# Patient Record
Sex: Female | Born: 1935 | Race: White | Hispanic: No | Smoking: Never smoker
Health system: Southern US, Community
[De-identification: ages and names within clinical notes are randomized; demographics above are authoritative.]

## PROBLEM LIST (undated history)

## (undated) DIAGNOSIS — I5181 Takotsubo syndrome: Secondary | ICD-10-CM

## (undated) DIAGNOSIS — M1990 Unspecified osteoarthritis, unspecified site: Secondary | ICD-10-CM

## (undated) DIAGNOSIS — K219 Gastro-esophageal reflux disease without esophagitis: Secondary | ICD-10-CM

## (undated) DIAGNOSIS — I219 Acute myocardial infarction, unspecified: Secondary | ICD-10-CM

## (undated) DIAGNOSIS — I10 Essential (primary) hypertension: Secondary | ICD-10-CM

## (undated) DIAGNOSIS — I495 Sick sinus syndrome: Secondary | ICD-10-CM

## (undated) DIAGNOSIS — I2510 Atherosclerotic heart disease of native coronary artery without angina pectoris: Secondary | ICD-10-CM

## (undated) DIAGNOSIS — Z950 Presence of cardiac pacemaker: Secondary | ICD-10-CM

## (undated) DIAGNOSIS — F32A Depression, unspecified: Secondary | ICD-10-CM

## (undated) DIAGNOSIS — F411 Generalized anxiety disorder: Secondary | ICD-10-CM

## (undated) DIAGNOSIS — I509 Heart failure, unspecified: Secondary | ICD-10-CM

## (undated) DIAGNOSIS — F329 Major depressive disorder, single episode, unspecified: Secondary | ICD-10-CM

## (undated) DIAGNOSIS — I428 Other cardiomyopathies: Secondary | ICD-10-CM

## (undated) DIAGNOSIS — I4891 Unspecified atrial fibrillation: Secondary | ICD-10-CM

## (undated) DIAGNOSIS — E7800 Pure hypercholesterolemia, unspecified: Secondary | ICD-10-CM

## (undated) DIAGNOSIS — R0602 Shortness of breath: Secondary | ICD-10-CM

## (undated) HISTORY — DX: Sick sinus syndrome: I49.5

## (undated) HISTORY — DX: Heart failure, unspecified: I50.9

## (undated) HISTORY — DX: Unspecified atrial fibrillation: I48.91

## (undated) HISTORY — DX: Gastro-esophageal reflux disease without esophagitis: K21.9

## (undated) HISTORY — PX: CERVICAL SPINE SURGERY: SHX589

## (undated) HISTORY — PX: CORONARY ANGIOPLASTY: SHX604

## (undated) HISTORY — PX: RADICAL HYSTERECTOMY: SHX2283

## (undated) HISTORY — DX: Shortness of breath: R06.02

## (undated) HISTORY — DX: Pure hypercholesterolemia, unspecified: E78.00

## (undated) HISTORY — PX: BACK SURGERY: SHX140

## (undated) HISTORY — DX: Generalized anxiety disorder: F41.1

## (undated) HISTORY — DX: Other cardiomyopathies: I42.8

## (undated) HISTORY — PX: PACEMAKER INSERTION: SHX728

---

## 1998-05-11 ENCOUNTER — Other Ambulatory Visit: Admission: RE | Admit: 1998-05-11 | Discharge: 1998-05-11 | Payer: Self-pay | Admitting: Internal Medicine

## 1998-05-29 ENCOUNTER — Inpatient Hospital Stay (HOSPITAL_COMMUNITY): Admission: EM | Admit: 1998-05-29 | Discharge: 1998-05-31 | Payer: Self-pay | Admitting: Emergency Medicine

## 1998-06-05 ENCOUNTER — Encounter: Admission: RE | Admit: 1998-06-05 | Discharge: 1998-09-03 | Payer: Self-pay | Admitting: Endocrinology

## 1998-06-06 ENCOUNTER — Inpatient Hospital Stay (HOSPITAL_COMMUNITY): Admission: EM | Admit: 1998-06-06 | Discharge: 1998-06-09 | Payer: Self-pay | Admitting: Emergency Medicine

## 1998-06-06 ENCOUNTER — Encounter: Payer: Self-pay | Admitting: Emergency Medicine

## 1999-05-17 ENCOUNTER — Other Ambulatory Visit: Admission: RE | Admit: 1999-05-17 | Discharge: 1999-05-17 | Payer: Self-pay | Admitting: Internal Medicine

## 2000-02-09 ENCOUNTER — Encounter: Payer: Self-pay | Admitting: Neurological Surgery

## 2000-02-10 ENCOUNTER — Inpatient Hospital Stay (HOSPITAL_COMMUNITY): Admission: RE | Admit: 2000-02-10 | Discharge: 2000-02-12 | Payer: Self-pay | Admitting: Neurological Surgery

## 2000-02-10 ENCOUNTER — Encounter: Payer: Self-pay | Admitting: Neurological Surgery

## 2000-04-07 ENCOUNTER — Encounter: Admission: RE | Admit: 2000-04-07 | Discharge: 2000-04-07 | Payer: Self-pay | Admitting: Neurological Surgery

## 2000-04-07 ENCOUNTER — Encounter: Payer: Self-pay | Admitting: Neurological Surgery

## 2000-05-19 ENCOUNTER — Other Ambulatory Visit: Admission: RE | Admit: 2000-05-19 | Discharge: 2000-05-19 | Payer: Self-pay | Admitting: Internal Medicine

## 2002-11-22 ENCOUNTER — Ambulatory Visit (HOSPITAL_COMMUNITY): Admission: RE | Admit: 2002-11-22 | Discharge: 2002-11-23 | Payer: Self-pay | Admitting: Internal Medicine

## 2002-11-22 ENCOUNTER — Encounter: Payer: Self-pay | Admitting: Internal Medicine

## 2002-11-23 ENCOUNTER — Encounter: Payer: Self-pay | Admitting: Internal Medicine

## 2003-11-02 ENCOUNTER — Emergency Department (HOSPITAL_COMMUNITY): Admission: EM | Admit: 2003-11-02 | Discharge: 2003-11-02 | Payer: Self-pay | Admitting: *Deleted

## 2004-08-12 ENCOUNTER — Ambulatory Visit: Payer: Self-pay | Admitting: Internal Medicine

## 2004-09-09 ENCOUNTER — Ambulatory Visit: Payer: Self-pay | Admitting: Internal Medicine

## 2004-09-30 ENCOUNTER — Ambulatory Visit: Payer: Self-pay | Admitting: Cardiology

## 2004-10-28 ENCOUNTER — Ambulatory Visit: Payer: Self-pay | Admitting: Cardiology

## 2004-11-05 ENCOUNTER — Encounter: Payer: Self-pay | Admitting: Cardiology

## 2004-11-05 ENCOUNTER — Inpatient Hospital Stay (HOSPITAL_COMMUNITY): Admission: EM | Admit: 2004-11-05 | Discharge: 2004-11-12 | Payer: Self-pay | Admitting: *Deleted

## 2004-11-05 ENCOUNTER — Ambulatory Visit: Payer: Self-pay | Admitting: Cardiology

## 2004-11-15 ENCOUNTER — Ambulatory Visit: Payer: Self-pay | Admitting: Cardiology

## 2004-11-15 ENCOUNTER — Ambulatory Visit: Payer: Self-pay | Admitting: *Deleted

## 2004-11-16 ENCOUNTER — Ambulatory Visit: Payer: Self-pay | Admitting: Cardiology

## 2004-11-25 ENCOUNTER — Ambulatory Visit: Payer: Self-pay | Admitting: Internal Medicine

## 2004-11-25 ENCOUNTER — Ambulatory Visit: Payer: Self-pay | Admitting: *Deleted

## 2004-12-14 ENCOUNTER — Ambulatory Visit: Payer: Self-pay | Admitting: Cardiovascular Disease

## 2004-12-14 ENCOUNTER — Ambulatory Visit: Payer: Self-pay

## 2005-01-10 ENCOUNTER — Ambulatory Visit: Payer: Self-pay | Admitting: Cardiology

## 2005-01-11 ENCOUNTER — Ambulatory Visit: Payer: Self-pay | Admitting: Cardiology

## 2005-01-17 ENCOUNTER — Ambulatory Visit: Payer: Self-pay

## 2005-01-24 ENCOUNTER — Ambulatory Visit: Payer: Self-pay | Admitting: *Deleted

## 2005-02-07 ENCOUNTER — Ambulatory Visit: Payer: Self-pay | Admitting: Cardiovascular Disease

## 2005-02-22 ENCOUNTER — Ambulatory Visit: Payer: Self-pay | Admitting: Cardiology

## 2005-03-22 ENCOUNTER — Ambulatory Visit: Payer: Self-pay | Admitting: Internal Medicine

## 2005-04-19 ENCOUNTER — Ambulatory Visit: Payer: Self-pay | Admitting: *Deleted

## 2005-04-25 ENCOUNTER — Ambulatory Visit: Payer: Self-pay | Admitting: Internal Medicine

## 2005-05-17 ENCOUNTER — Ambulatory Visit: Payer: Self-pay | Admitting: Cardiology

## 2005-06-07 ENCOUNTER — Ambulatory Visit: Payer: Self-pay | Admitting: Cardiovascular Disease

## 2005-06-20 ENCOUNTER — Ambulatory Visit: Payer: Self-pay

## 2005-07-04 ENCOUNTER — Ambulatory Visit: Payer: Self-pay | Admitting: Internal Medicine

## 2005-08-01 ENCOUNTER — Ambulatory Visit: Payer: Self-pay | Admitting: Cardiology

## 2005-08-15 ENCOUNTER — Ambulatory Visit: Payer: Self-pay | Admitting: Internal Medicine

## 2005-08-26 ENCOUNTER — Ambulatory Visit: Payer: Self-pay | Admitting: Cardiovascular Disease

## 2005-08-26 ENCOUNTER — Ambulatory Visit: Payer: Self-pay | Admitting: Cardiology

## 2005-08-29 ENCOUNTER — Ambulatory Visit: Payer: Self-pay

## 2005-09-09 ENCOUNTER — Ambulatory Visit: Payer: Self-pay | Admitting: Cardiovascular Disease

## 2005-09-30 ENCOUNTER — Ambulatory Visit: Payer: Self-pay | Admitting: Internal Medicine

## 2005-10-05 ENCOUNTER — Observation Stay (HOSPITAL_COMMUNITY): Admission: EM | Admit: 2005-10-05 | Discharge: 2005-10-06 | Payer: Self-pay | Admitting: Emergency Medicine

## 2005-10-06 ENCOUNTER — Ambulatory Visit: Payer: Self-pay | Admitting: *Deleted

## 2005-10-14 ENCOUNTER — Ambulatory Visit: Payer: Self-pay

## 2005-10-28 ENCOUNTER — Ambulatory Visit: Payer: Self-pay | Admitting: Cardiology

## 2005-11-07 ENCOUNTER — Ambulatory Visit: Payer: Self-pay | Admitting: Internal Medicine

## 2005-11-07 ENCOUNTER — Ambulatory Visit: Payer: Self-pay | Admitting: Cardiovascular Disease

## 2005-11-15 ENCOUNTER — Ambulatory Visit: Payer: Self-pay | Admitting: Cardiology

## 2005-12-05 ENCOUNTER — Ambulatory Visit: Payer: Self-pay | Admitting: Internal Medicine

## 2005-12-26 ENCOUNTER — Ambulatory Visit: Payer: Self-pay | Admitting: Internal Medicine

## 2006-01-11 ENCOUNTER — Ambulatory Visit: Payer: Self-pay | Admitting: Cardiology

## 2006-01-23 ENCOUNTER — Ambulatory Visit: Payer: Self-pay | Admitting: Internal Medicine

## 2006-01-24 ENCOUNTER — Ambulatory Visit: Payer: Self-pay

## 2006-01-31 ENCOUNTER — Ambulatory Visit: Payer: Self-pay | Admitting: Cardiovascular Disease

## 2006-02-06 ENCOUNTER — Ambulatory Visit: Payer: Self-pay | Admitting: Cardiology

## 2006-02-08 ENCOUNTER — Encounter: Payer: Self-pay | Admitting: Endocrinology

## 2006-02-20 ENCOUNTER — Ambulatory Visit: Payer: Self-pay | Admitting: Cardiology

## 2006-03-13 ENCOUNTER — Ambulatory Visit: Payer: Self-pay | Admitting: Cardiology

## 2006-04-10 ENCOUNTER — Ambulatory Visit: Payer: Self-pay | Admitting: Cardiovascular Disease

## 2006-04-24 ENCOUNTER — Ambulatory Visit: Payer: Self-pay | Admitting: Cardiology

## 2006-05-10 ENCOUNTER — Ambulatory Visit: Payer: Self-pay | Admitting: *Deleted

## 2006-05-31 ENCOUNTER — Ambulatory Visit: Payer: Self-pay | Admitting: Cardiology

## 2006-06-15 ENCOUNTER — Ambulatory Visit: Payer: Self-pay | Admitting: Cardiovascular Disease

## 2006-06-21 ENCOUNTER — Ambulatory Visit: Payer: Self-pay | Admitting: Cardiovascular Disease

## 2006-07-05 ENCOUNTER — Ambulatory Visit: Payer: Self-pay | Admitting: Cardiology

## 2006-07-26 ENCOUNTER — Ambulatory Visit: Payer: Self-pay | Admitting: *Deleted

## 2006-08-23 ENCOUNTER — Ambulatory Visit: Payer: Self-pay | Admitting: Cardiology

## 2006-09-20 ENCOUNTER — Ambulatory Visit: Payer: Self-pay | Admitting: *Deleted

## 2006-10-13 ENCOUNTER — Ambulatory Visit: Payer: Self-pay | Admitting: Cardiology

## 2006-11-13 ENCOUNTER — Ambulatory Visit: Payer: Self-pay | Admitting: Cardiology

## 2006-12-11 ENCOUNTER — Ambulatory Visit: Payer: Self-pay | Admitting: Cardiology

## 2006-12-27 ENCOUNTER — Ambulatory Visit: Payer: Self-pay | Admitting: Cardiovascular Disease

## 2007-01-09 ENCOUNTER — Ambulatory Visit: Payer: Self-pay | Admitting: Internal Medicine

## 2007-02-07 ENCOUNTER — Ambulatory Visit: Payer: Self-pay | Admitting: Internal Medicine

## 2007-02-21 ENCOUNTER — Ambulatory Visit: Payer: Self-pay | Admitting: *Deleted

## 2007-03-07 ENCOUNTER — Ambulatory Visit: Payer: Self-pay | Admitting: Cardiology

## 2007-04-04 ENCOUNTER — Ambulatory Visit: Payer: Self-pay | Admitting: Internal Medicine

## 2007-04-25 ENCOUNTER — Ambulatory Visit: Payer: Self-pay | Admitting: Internal Medicine

## 2007-05-30 ENCOUNTER — Ambulatory Visit: Payer: Self-pay | Admitting: Cardiovascular Disease

## 2007-06-14 ENCOUNTER — Ambulatory Visit: Payer: Self-pay | Admitting: Cardiology

## 2007-06-28 ENCOUNTER — Ambulatory Visit: Payer: Self-pay | Admitting: Cardiology

## 2007-06-28 ENCOUNTER — Ambulatory Visit: Payer: Self-pay | Admitting: Cardiovascular Disease

## 2007-07-02 ENCOUNTER — Emergency Department (HOSPITAL_COMMUNITY): Admission: EM | Admit: 2007-07-02 | Discharge: 2007-07-03 | Payer: Self-pay | Admitting: Emergency Medicine

## 2007-07-11 ENCOUNTER — Ambulatory Visit: Payer: Self-pay | Admitting: Internal Medicine

## 2007-07-19 ENCOUNTER — Ambulatory Visit: Payer: Self-pay | Admitting: Cardiology

## 2007-08-17 ENCOUNTER — Ambulatory Visit: Payer: Self-pay | Admitting: Cardiology

## 2007-08-30 ENCOUNTER — Ambulatory Visit: Payer: Self-pay | Admitting: Cardiology

## 2007-09-23 ENCOUNTER — Emergency Department (HOSPITAL_COMMUNITY): Admission: EM | Admit: 2007-09-23 | Discharge: 2007-09-23 | Payer: Self-pay | Admitting: Emergency Medicine

## 2007-10-24 ENCOUNTER — Ambulatory Visit: Payer: Self-pay | Admitting: Cardiovascular Disease

## 2007-11-22 ENCOUNTER — Ambulatory Visit: Payer: Self-pay | Admitting: Cardiology

## 2007-11-28 ENCOUNTER — Ambulatory Visit: Payer: Self-pay | Admitting: Internal Medicine

## 2007-12-21 ENCOUNTER — Ambulatory Visit: Payer: Self-pay | Admitting: Cardiovascular Disease

## 2007-12-21 ENCOUNTER — Ambulatory Visit: Payer: Self-pay | Admitting: Internal Medicine

## 2008-01-10 ENCOUNTER — Ambulatory Visit: Payer: Self-pay

## 2008-01-10 ENCOUNTER — Ambulatory Visit: Payer: Self-pay | Admitting: Internal Medicine

## 2008-01-18 ENCOUNTER — Encounter: Admission: RE | Admit: 2008-01-18 | Discharge: 2008-01-18 | Payer: Self-pay | Admitting: Internal Medicine

## 2008-02-07 ENCOUNTER — Ambulatory Visit: Payer: Self-pay | Admitting: Cardiology

## 2008-03-06 ENCOUNTER — Ambulatory Visit: Payer: Self-pay | Admitting: Cardiology

## 2008-03-27 ENCOUNTER — Ambulatory Visit: Payer: Self-pay | Admitting: Cardiology

## 2008-04-24 ENCOUNTER — Ambulatory Visit: Payer: Self-pay | Admitting: Cardiovascular Disease

## 2008-04-28 ENCOUNTER — Ambulatory Visit: Payer: Self-pay

## 2008-05-08 ENCOUNTER — Ambulatory Visit: Payer: Self-pay | Admitting: Cardiology

## 2008-05-29 ENCOUNTER — Ambulatory Visit: Payer: Self-pay | Admitting: Internal Medicine

## 2008-06-12 ENCOUNTER — Ambulatory Visit: Payer: Self-pay | Admitting: Internal Medicine

## 2008-06-18 ENCOUNTER — Ambulatory Visit: Payer: Self-pay | Admitting: Cardiovascular Disease

## 2008-06-18 ENCOUNTER — Ambulatory Visit: Payer: Self-pay | Admitting: Internal Medicine

## 2008-06-18 LAB — CONVERTED CEMR LAB
BUN: 17 mg/dL (ref 6–23)
CO2: 27 meq/L (ref 19–32)
Calcium: 9 mg/dL (ref 8.4–10.5)
Glucose, Bld: 142 mg/dL — ABNORMAL HIGH (ref 70–99)
Sodium: 141 meq/L (ref 135–145)

## 2008-07-02 ENCOUNTER — Ambulatory Visit: Payer: Self-pay | Admitting: Cardiology

## 2008-07-16 ENCOUNTER — Ambulatory Visit: Payer: Self-pay | Admitting: Internal Medicine

## 2008-07-23 ENCOUNTER — Ambulatory Visit: Payer: Self-pay | Admitting: Internal Medicine

## 2008-08-21 ENCOUNTER — Ambulatory Visit: Payer: Self-pay | Admitting: Cardiology

## 2008-08-28 ENCOUNTER — Ambulatory Visit: Payer: Self-pay | Admitting: Cardiology

## 2008-09-11 ENCOUNTER — Ambulatory Visit: Payer: Self-pay | Admitting: Internal Medicine

## 2008-10-09 ENCOUNTER — Ambulatory Visit: Payer: Self-pay | Admitting: Cardiology

## 2008-10-15 ENCOUNTER — Ambulatory Visit: Payer: Self-pay | Admitting: Internal Medicine

## 2008-11-06 ENCOUNTER — Ambulatory Visit: Payer: Self-pay | Admitting: Cardiology

## 2008-12-04 ENCOUNTER — Ambulatory Visit: Payer: Self-pay | Admitting: Cardiology

## 2009-01-01 ENCOUNTER — Ambulatory Visit: Payer: Self-pay | Admitting: Internal Medicine

## 2009-01-28 DIAGNOSIS — I2510 Atherosclerotic heart disease of native coronary artery without angina pectoris: Secondary | ICD-10-CM | POA: Insufficient documentation

## 2009-01-28 DIAGNOSIS — I428 Other cardiomyopathies: Secondary | ICD-10-CM

## 2009-01-28 DIAGNOSIS — E7800 Pure hypercholesterolemia, unspecified: Secondary | ICD-10-CM

## 2009-01-28 DIAGNOSIS — R609 Edema, unspecified: Secondary | ICD-10-CM | POA: Insufficient documentation

## 2009-01-28 DIAGNOSIS — I509 Heart failure, unspecified: Secondary | ICD-10-CM | POA: Insufficient documentation

## 2009-01-28 DIAGNOSIS — F411 Generalized anxiety disorder: Secondary | ICD-10-CM | POA: Insufficient documentation

## 2009-01-28 DIAGNOSIS — I4891 Unspecified atrial fibrillation: Secondary | ICD-10-CM | POA: Insufficient documentation

## 2009-01-28 DIAGNOSIS — I498 Other specified cardiac arrhythmias: Secondary | ICD-10-CM | POA: Insufficient documentation

## 2009-01-28 DIAGNOSIS — I495 Sick sinus syndrome: Secondary | ICD-10-CM | POA: Insufficient documentation

## 2009-01-28 DIAGNOSIS — I219 Acute myocardial infarction, unspecified: Secondary | ICD-10-CM | POA: Insufficient documentation

## 2009-01-28 DIAGNOSIS — M1990 Unspecified osteoarthritis, unspecified site: Secondary | ICD-10-CM | POA: Insufficient documentation

## 2009-01-28 DIAGNOSIS — R0602 Shortness of breath: Secondary | ICD-10-CM | POA: Insufficient documentation

## 2009-01-28 DIAGNOSIS — K219 Gastro-esophageal reflux disease without esophagitis: Secondary | ICD-10-CM

## 2009-01-29 ENCOUNTER — Encounter: Payer: Self-pay | Admitting: Cardiovascular Disease

## 2009-01-29 ENCOUNTER — Ambulatory Visit: Payer: Self-pay | Admitting: Cardiovascular Disease

## 2009-02-02 ENCOUNTER — Encounter: Payer: Self-pay | Admitting: Internal Medicine

## 2009-02-02 ENCOUNTER — Ambulatory Visit: Payer: Self-pay | Admitting: Internal Medicine

## 2009-02-02 ENCOUNTER — Ambulatory Visit: Payer: Self-pay

## 2009-02-11 ENCOUNTER — Ambulatory Visit: Payer: Self-pay | Admitting: Internal Medicine

## 2009-02-18 ENCOUNTER — Ambulatory Visit: Payer: Self-pay | Admitting: Internal Medicine

## 2009-02-25 ENCOUNTER — Ambulatory Visit: Payer: Self-pay | Admitting: Internal Medicine

## 2009-03-04 ENCOUNTER — Ambulatory Visit: Payer: Self-pay | Admitting: Internal Medicine

## 2009-03-10 ENCOUNTER — Encounter: Payer: Self-pay | Admitting: *Deleted

## 2009-03-12 ENCOUNTER — Ambulatory Visit: Payer: Self-pay | Admitting: Internal Medicine

## 2009-03-26 ENCOUNTER — Ambulatory Visit: Payer: Self-pay | Admitting: Internal Medicine

## 2009-04-06 ENCOUNTER — Ambulatory Visit: Payer: Self-pay | Admitting: Internal Medicine

## 2009-04-07 ENCOUNTER — Encounter: Payer: Self-pay | Admitting: Cardiovascular Disease

## 2009-04-15 ENCOUNTER — Encounter: Payer: Self-pay | Admitting: *Deleted

## 2009-04-23 ENCOUNTER — Ambulatory Visit: Payer: Self-pay | Admitting: Internal Medicine

## 2009-04-29 ENCOUNTER — Encounter: Payer: Self-pay | Admitting: Internal Medicine

## 2009-05-11 ENCOUNTER — Ambulatory Visit: Payer: Self-pay | Admitting: Internal Medicine

## 2009-05-14 ENCOUNTER — Ambulatory Visit: Payer: Self-pay | Admitting: Cardiology

## 2009-05-14 LAB — CONVERTED CEMR LAB: POC INR: 0.9

## 2009-05-20 ENCOUNTER — Ambulatory Visit: Payer: Self-pay | Admitting: Cardiology

## 2009-05-20 LAB — CONVERTED CEMR LAB: POC INR: 2.3

## 2009-05-29 ENCOUNTER — Ambulatory Visit: Payer: Self-pay | Admitting: Cardiology

## 2009-05-29 LAB — CONVERTED CEMR LAB: POC INR: 2.7

## 2009-06-01 ENCOUNTER — Ambulatory Visit: Payer: Self-pay | Admitting: Cardiovascular Disease

## 2009-06-01 LAB — CONVERTED CEMR LAB: POC INR: 2.8

## 2009-06-19 ENCOUNTER — Telehealth: Payer: Self-pay | Admitting: Cardiovascular Disease

## 2009-06-22 ENCOUNTER — Ambulatory Visit: Payer: Self-pay | Admitting: Cardiovascular Disease

## 2009-07-20 ENCOUNTER — Ambulatory Visit: Payer: Self-pay | Admitting: Internal Medicine

## 2009-07-20 LAB — CONVERTED CEMR LAB: POC INR: 2.7

## 2009-07-22 ENCOUNTER — Ambulatory Visit: Payer: Self-pay | Admitting: Internal Medicine

## 2009-07-29 ENCOUNTER — Encounter: Payer: Self-pay | Admitting: Internal Medicine

## 2009-08-02 ENCOUNTER — Emergency Department (HOSPITAL_COMMUNITY): Admission: EM | Admit: 2009-08-02 | Discharge: 2009-08-02 | Payer: Self-pay | Admitting: Emergency Medicine

## 2009-08-02 ENCOUNTER — Encounter: Payer: Self-pay | Admitting: Cardiovascular Disease

## 2009-08-17 ENCOUNTER — Ambulatory Visit: Payer: Self-pay | Admitting: Internal Medicine

## 2009-08-31 ENCOUNTER — Ambulatory Visit: Payer: Self-pay | Admitting: Internal Medicine

## 2009-09-01 ENCOUNTER — Encounter (INDEPENDENT_AMBULATORY_CARE_PROVIDER_SITE_OTHER): Payer: Self-pay | Admitting: *Deleted

## 2009-09-21 ENCOUNTER — Ambulatory Visit: Payer: Self-pay | Admitting: Cardiovascular Disease

## 2009-10-15 ENCOUNTER — Encounter (INDEPENDENT_AMBULATORY_CARE_PROVIDER_SITE_OTHER): Payer: Self-pay | Admitting: *Deleted

## 2009-10-23 ENCOUNTER — Encounter: Payer: Self-pay | Admitting: Internal Medicine

## 2009-10-26 ENCOUNTER — Ambulatory Visit: Payer: Self-pay | Admitting: Cardiology

## 2009-10-26 LAB — CONVERTED CEMR LAB: POC INR: 2.6

## 2009-11-03 ENCOUNTER — Ambulatory Visit: Payer: Self-pay | Admitting: Internal Medicine

## 2009-11-11 ENCOUNTER — Encounter: Payer: Self-pay | Admitting: Internal Medicine

## 2009-11-23 ENCOUNTER — Encounter (INDEPENDENT_AMBULATORY_CARE_PROVIDER_SITE_OTHER): Payer: Self-pay | Admitting: Cardiology

## 2009-11-23 ENCOUNTER — Ambulatory Visit: Payer: Self-pay | Admitting: Cardiology

## 2009-11-23 LAB — CONVERTED CEMR LAB: POC INR: 2.2

## 2009-12-29 ENCOUNTER — Ambulatory Visit: Payer: Self-pay | Admitting: Cardiovascular Disease

## 2010-01-19 ENCOUNTER — Ambulatory Visit: Payer: Self-pay | Admitting: Internal Medicine

## 2010-01-26 ENCOUNTER — Ambulatory Visit: Payer: Self-pay | Admitting: Cardiovascular Disease

## 2010-01-26 LAB — CONVERTED CEMR LAB: POC INR: 3.1

## 2010-02-15 ENCOUNTER — Emergency Department (HOSPITAL_COMMUNITY): Admission: EM | Admit: 2010-02-15 | Discharge: 2010-02-15 | Payer: Self-pay | Admitting: Emergency Medicine

## 2010-03-02 ENCOUNTER — Ambulatory Visit: Payer: Self-pay | Admitting: Cardiology

## 2010-03-02 LAB — CONVERTED CEMR LAB: POC INR: 5.2

## 2010-03-09 ENCOUNTER — Ambulatory Visit: Payer: Self-pay | Admitting: Internal Medicine

## 2010-03-19 ENCOUNTER — Ambulatory Visit: Payer: Self-pay | Admitting: Cardiovascular Disease

## 2010-04-05 ENCOUNTER — Ambulatory Visit: Payer: Self-pay | Admitting: Cardiology

## 2010-04-20 ENCOUNTER — Ambulatory Visit: Payer: Self-pay | Admitting: Cardiovascular Disease

## 2010-05-07 ENCOUNTER — Encounter (INDEPENDENT_AMBULATORY_CARE_PROVIDER_SITE_OTHER): Payer: Self-pay | Admitting: *Deleted

## 2010-05-10 ENCOUNTER — Ambulatory Visit: Payer: Self-pay | Admitting: Cardiovascular Disease

## 2010-05-27 ENCOUNTER — Ambulatory Visit: Payer: Self-pay

## 2010-05-27 ENCOUNTER — Encounter: Payer: Self-pay | Admitting: Internal Medicine

## 2010-05-27 ENCOUNTER — Ambulatory Visit: Payer: Self-pay | Admitting: Cardiovascular Disease

## 2010-06-17 ENCOUNTER — Ambulatory Visit: Payer: Self-pay | Admitting: Internal Medicine

## 2010-07-06 ENCOUNTER — Ambulatory Visit: Payer: Self-pay | Admitting: Cardiovascular Disease

## 2010-07-09 ENCOUNTER — Inpatient Hospital Stay (HOSPITAL_COMMUNITY): Admission: EM | Admit: 2010-07-09 | Discharge: 2010-07-15 | Payer: Self-pay | Admitting: Emergency Medicine

## 2010-07-09 ENCOUNTER — Ambulatory Visit: Payer: Self-pay | Admitting: Internal Medicine

## 2010-07-10 ENCOUNTER — Encounter: Payer: Self-pay | Admitting: Internal Medicine

## 2010-07-27 ENCOUNTER — Ambulatory Visit: Payer: Self-pay | Admitting: Cardiology

## 2010-07-27 LAB — CONVERTED CEMR LAB: POC INR: 2.9

## 2010-08-06 ENCOUNTER — Ambulatory Visit: Payer: Self-pay | Admitting: Cardiovascular Disease

## 2010-08-09 ENCOUNTER — Telehealth: Payer: Self-pay | Admitting: Cardiovascular Disease

## 2010-08-10 LAB — CONVERTED CEMR LAB
CO2: 28 meq/L (ref 19–32)
Calcium: 9.3 mg/dL (ref 8.4–10.5)
Chloride: 106 meq/L (ref 96–112)
Glucose, Bld: 94 mg/dL (ref 70–99)
Potassium: 4.2 meq/L (ref 3.5–5.1)
Sed Rate: 67 mm/hr — ABNORMAL HIGH (ref 0–22)
Sodium: 142 meq/L (ref 135–145)

## 2010-08-23 ENCOUNTER — Telehealth: Payer: Self-pay | Admitting: Cardiovascular Disease

## 2010-08-24 ENCOUNTER — Ambulatory Visit: Payer: Self-pay | Admitting: Cardiovascular Disease

## 2010-08-24 LAB — CONVERTED CEMR LAB: POC INR: 2.3

## 2010-08-31 ENCOUNTER — Encounter: Payer: Self-pay | Admitting: Internal Medicine

## 2010-09-21 ENCOUNTER — Ambulatory Visit: Payer: Self-pay | Admitting: Internal Medicine

## 2010-09-21 LAB — CONVERTED CEMR LAB: POC INR: 3

## 2010-10-08 ENCOUNTER — Encounter (INDEPENDENT_AMBULATORY_CARE_PROVIDER_SITE_OTHER): Payer: Self-pay | Admitting: *Deleted

## 2010-10-13 ENCOUNTER — Encounter: Payer: Self-pay | Admitting: Internal Medicine

## 2010-10-14 ENCOUNTER — Encounter: Payer: Self-pay | Admitting: Internal Medicine

## 2010-10-14 ENCOUNTER — Ambulatory Visit
Admission: RE | Admit: 2010-10-14 | Discharge: 2010-10-14 | Payer: Self-pay | Source: Home / Self Care | Attending: Internal Medicine | Admitting: Internal Medicine

## 2010-10-19 ENCOUNTER — Ambulatory Visit: Admission: RE | Admit: 2010-10-19 | Discharge: 2010-10-19 | Payer: Self-pay | Source: Home / Self Care

## 2010-10-20 ENCOUNTER — Encounter (INDEPENDENT_AMBULATORY_CARE_PROVIDER_SITE_OTHER): Payer: Self-pay | Admitting: *Deleted

## 2010-10-29 ENCOUNTER — Ambulatory Visit: Admit: 2010-10-29 | Payer: Self-pay | Admitting: Cardiovascular Disease

## 2010-11-09 NOTE — Letter (Signed)
Summary: Device-Delinquent Check  [facility], Main Office  [address]   [place], [place] [address]   Phone: [phone]  Fax: [phone]     May 07, 2010 MRN: [id number]   [name] [address] [place], [place]  [address]   Dear [name],  According to our records, you have not had your implanted device checked in the recommended period of time.  We are unable to determine appropriate device function without checking your device on a regular basis.  Please call our office to schedule an appointment with Dr. Ladona Ridgel, as soon as possible.  If you are having your device checked by another physician, please call us so that we may update our records.  Thank you,  Altha Harm, LPN  May 07, 2010 2:21 PM  [facility]

## 2010-11-11 NOTE — Assessment & Plan Note (Signed)
Summary: eph/heart failure/mt   Primary Provider:  Jacky Kindle, MD  CC:  follow up from hospital.  History of Present Illness: [name] is seen today post hospitalizatoin.  She has a history of MI with T[place].  Cath this hospitalization showed no CAD wit EF 25% and mildly increased filling pressures.  Meds adjusted and quick improvement.  ESR 70 being w/u by Dr Evlyn Kanner.  Still with some PND and orthopnea but tough to tell due to her anxiety and insommnia.  No SSCP, no edema, weight stable.  Pain in legs.  Statin stopped by primary 2 weeks ago.  Ok since she has no vascular disease  Current Problems (verified): 1)  Osteoarthritis  (ICD-715.90) 2)  Gerd  (ICD-530.81) 3)  Bradycardia  (ICD-427.89) 4)  Myocardial Infarction  (ICD-410.90) 5)  CHF  (ICD-428.0) 6)  Hypercholesterolemia  (ICD-272.0) 7)  Shortness of Breath  (ICD-786.05) 8)  Sick Sinus Syndrome  (ICD-427.81) 9)  Paroxysmal Atrial Fibrillation  (ICD-427.31) 10)  Cad  (ICD-414.00) 11)  Cardiomyopathy  (ICD-425.4) 12)  Anxiety  (ICD-300.00) 13)  Edema  (ICD-782.3)  Current Medications (verified): 1)  Benazepril Hcl 20 Mg Tabs (Benazepril Hcl) .Marland Kitchen.. 1 Once Daily 2)  Warfarin Sodium 4 Mg Tabs (Warfarin Sodium) .... Use As Directed By Anticoagulation Clinic 3)  Pravachol 20 Mg Tabs (Pravastatin Sodium) .Marland Kitchen.. 1 Tab By Mouth Once Daily 4)  Glipizide-Metformin Hcl 5-500 Mg Tabs (Glipizide-Metformin Hcl) .... Two Times A Day 5)  Xanax 0.25 Mg Tabs (Alprazolam) .Marland Kitchen.. 1 Two Times A Day 6)  Metoprolol Succinate 50 Mg Xr24h-Tab (Metoprolol Succinate) .... Take One Tablet By Mouth Daily 7)  Furosemide 40 Mg Tabs (Furosemide) .... Take 1/2 Tablet Two Times A Day 8)  Namenda 10 Mg Tabs (Memantine Hcl) .Marland Kitchen.. 1 Tab By Mouth Two Times A Day 9)  Nitroglycerin 0.4 Mg Subl (Nitroglycerin) .... One Tablet Under Tongue Every 5 Minutes As Needed For Chest Pain---May Repeat Times Three 10)  Paxil 20 Mg Tabs (Paroxetine Hcl) .... Take 1 Tablet Once Daily 11)   Spironolactone 25 Mg Tabs (Spironolactone) .... Take 1/2 Tablet By Mouth Daily  Allergies (verified): No Known Drug Allergies  Past History:  Past Medical History: Last updated: 2009-02-04 Current Problems:  BRADYCARDIA (ICD-427.89) MYOCARDIAL INFARCTION (ICD-410.90) CHF (ICD-428.0) HYPERCHOLESTEROLEMIA (ICD-272.0) SHORTNESS OF BREATH (ICD-786.05) SICK SINUS SYNDROME (ICD-427.81) PAROXYSMAL ATRIAL FIBRILLATION (ICD-427.31) CAD (ICD-414.00) CARDIOMYOPATHY (ICD-425.4) ANXIETY (ICD-300.00) EDEMA (ICD-782.3)  Anticoagulation therapy with Coumadin.  Past Surgical History: Last updated: 2009-02-04 Status post cardiac catheterization in January of 2006 Status post Adenosine Myoview in April of 2006 Status post dobutamine and Cardiolite in December 2003 pacemaker Status post C-spine surgery in the past.  back surgery hysterectomy   Family History: Last updated: 02-04-2009 Her parents both died in their 59's and neither had heart  disease. Her brother is still alive and has a history of heart disease.  Social History: Last updated: 02/04/09 She lives in [place] alone and is a retired Producer, television/film/video for [employer].  Tobacco Use - No.  Alcohol Use - no  Review of Systems       Denies fever, malais, weight loss, blurry vision, decreased visual acuity, cough, sputum, hemoptysis, pleuritic pain, palpitaitons, heartburn, abdominal pain, melena, lower extremity edema, claudication, or rash.   Vital Signs:  Patient profile:   76 year old female Height:      66 inches Weight:      205 pounds BMI:     33.21 Pulse rate:   69 / minute  Resp:     14 per minute BP sitting:   133 / 76  (left arm)  Vitals Entered By: Kem Parkinson (August 06, 2010 10:41 AM)  Physical Exam  General:  Affect appropriate Healthy:  appears stated age HEENT: normal Neck supple with no adenopathy JVP normal no bruits no thyromegaly Lungs clear with no wheezing and good  diaphragmatic motion Heart:  S1/S2 no murmur,rub, gallop or click PMI normal Abdomen: benighn, BS positve, no tenderness, no AAA no bruit.  No HSM or HJR Distal pulses intact with no bruits No edema Neuro non-focal Skin warm and dry    PPM Specifications Following MD:  Lewayne Bunting, MD     Referring MD:  [facility] PPM Vendor:  Medtronic     PPM Model Number:  [id number]     PPM Serial Number:  [id number] PPM DOI:  11/22/2002     PPM Implanting MD:  Lewayne Bunting, MD  Lead 1    Location: RA     DOI: 11/22/2002     Model #: [id number]     Serial #: [id number]     Status: active Lead 2    Location: RV     DOI: 11/22/2002     Model #: [id number]     Serial #: [id number]     Status: active  Magnet Response Rate:  ERI 65  Indications:  BRADYCARDIA, AV BLOCK   PPM Follow Up Pacer Dependent:  Yes      Episodes Coumadin:  Yes  Parameters Mode:  DDDR     Lower Rate Limit:  60     Upper Rate Limit:  130 Paced AV Delay:  150     Sensed AV Delay:  120  Impression & Recommendations:  Problem # 1:  BRADYCARDIA (ICD-427.89) Pacer with no tachypalpitations.   Her updated medication list for this problem includes:    Benazepril Hcl 20 Mg Tabs (Benazepril hcl) .Marland Kitchen... 1 once daily    Warfarin Sodium 4 Mg Tabs (Warfarin sodium) ..... Use as directed by anticoagulation clinic    Metoprolol Succinate 50 Mg Xr24h-tab (Metoprolol succinate) .Marland Kitchen... Take one tablet by mouth daily    Nitroglycerin 0.4 Mg Subl (Nitroglycerin) ..... One tablet under tongue every 5 minutes as needed for chest pain---may repeat times three  Problem # 2:  MYOCARDIAL INFARCTION (ICD-410.90) Catachol related but recurrent decrease in EF.  Continue lasix bb and ace Her updated medication list for this problem includes:    Benazepril Hcl 20 Mg Tabs (Benazepril hcl) .Marland Kitchen... 1 once daily    Warfarin Sodium 4 Mg Tabs (Warfarin sodium) ..... Use as directed by anticoagulation clinic    Metoprolol Succinate 50 Mg Xr24h-tab (Metoprolol succinate)  .Marland Kitchen... Take one tablet by mouth daily    Nitroglycerin 0.4 Mg Subl (Nitroglycerin) ..... One tablet under tongue every 5 minutes as needed for chest pain---may repeat times three  Problem # 3:  CHF (ICD-428.0) Improved check labs today.  Low sodium diet Her updated medication list for this problem includes:    Benazepril Hcl 20 Mg Tabs (Benazepril hcl) .Marland Kitchen... 1 once daily    Warfarin Sodium 4 Mg Tabs (Warfarin sodium) ..... Use as directed by anticoagulation clinic    Metoprolol Succinate 50 Mg Xr24h-tab (Metoprolol succinate) .Marland Kitchen... Take one tablet by mouth daily    Furosemide 40 Mg Tabs (Furosemide) .Marland Kitchen... Take 1/2 tablet two times a day    Nitroglycerin 0.4 Mg Subl (Nitroglycerin) ..... One tablet under tongue every 5 minutes as needed for  chest pain---may repeat times three    Spironolactone 25 Mg Tabs (Spironolactone) .Marland Kitchen... Take 1/2 tablet by mouth daily  Problem # 4:  HYPERCHOLESTEROLEMIA (ICD-272.0) Resume statin if leg pain not improved.  Pulses are good with no evidence of vascular disease Her updated medication list for this problem includes:    Pravachol 20 Mg Tabs (Pravastatin sodium) .Marland Kitchen... 1 tab by mouth once daily  Other Orders: TLB-BMP (Basic Metabolic Panel-BMET) (80048-METABOL) TLB-BNP (B-Natriuretic Peptide) (83880-BNPR) TLB-Sedimentation Rate (ESR) (85652-ESR)  Patient Instructions: 1)  Your physician recommends that you schedule a follow-up appointment in: 3 MONTHS

## 2010-11-11 NOTE — Medication Information (Signed)
Summary: rov.mp  Anticoagulant Therapy  Managed by: Bethena Midget, RN, BSN Referring MD: Charlton Haws MD Supervising MD: [name] MD, Theron Arista Indication 1: Atrial Fibrillation (ICD-427.31) Indication 2: Congestive Heart Failure (ICD-428.0) Lab Used: [facility] Site: [facility] INR POC 2.3 INR RANGE 2.0-3.0  Dietary changes: no    Health status changes: no    Bleeding/hemorrhagic complications: no    Recent/future hospitalizations: no    Any changes in medication regimen? no    Recent/future dental: no  Any missed doses?: no       Is patient compliant with meds? yes      Comments: Saw Dr. [name] today.   Allergies: No Known Drug Allergies  Anticoagulation Management History:      The patient is taking warfarin and comes in today for a routine follow up visit.  Positive risk factors for bleeding include an age of 74 years or older.  The bleeding index is 'intermediate risk'.  Positive CHADS2 values include History of CHF.  Negative CHADS2 values include Age > 57 years old.  The start date was 11/08/2004.  Anticoagulation responsible provider: [name] MD, Theron Arista.  INR POC: 2.3.  Cuvette Lot#: [id number].  Exp: 02/2011.    Anticoagulation Management Assessment/Plan:      The patient's current anticoagulation dose is Warfarin sodium 4 mg tabs: Use as directed by Anticoagulation Clinic.  The target INR is 2.0-3.0.  The next INR is due 01/26/2010.  Anticoagulation instructions were given to patient/daughter.  Results were reviewed/authorized by Bethena Midget, RN, BSN.  She was notified by Bethena Midget, RN, BSN.         Prior Anticoagulation Instructions: INR 2.2  Continue 0.5 tab daily except 1 tab each Monday, Wednesday, Friday.  Recheck in 4 weeks.   Current Anticoagulation Instructions: INR 2.3 Continue 2mg s daily except 4mg s on Mondays, Wednesdays, and Fridays. Recheck in 4 weeks.

## 2010-11-11 NOTE — Medication Information (Signed)
Summary: rov/tm  Anticoagulant Therapy  Managed by: Weston Brass, PharmD Referring MD: Charlton Haws MD PCP: Jacky Kindle, MD Supervising MD: Gala Romney MD, Reuel Boom Indication 1: Atrial Fibrillation (ICD-427.31) Indication 2: Congestive Heart Failure (ICD-428.0) Lab Used: [facility] Site: [facility] INR POC 3.3 INR RANGE 2.0-3.0  Dietary changes: yes       Details: Patient has increased amount of greens in diet.  Health status changes: no    Bleeding/hemorrhagic complications: no    Recent/future hospitalizations: no    Any changes in medication regimen? no    Recent/future dental: no   Is patient compliant with meds? yes       Allergies: No Known Drug Allergies  Anticoagulation Management History:      The patient is taking warfarin and comes in today for a routine follow up visit.  Positive risk factors for bleeding include an age of 74 years or older.  The bleeding index is 'intermediate risk'.  Positive CHADS2 values include History of CHF.  Negative CHADS2 values include Age > 45 years old.  The start date was 11/08/2004.  Anticoagulation responsible provider: Bensimhon MD, Reuel Boom.  INR POC: 3.3.  Cuvette Lot#: [id number].  Exp: 05/2011.    Anticoagulation Management Assessment/Plan:      The patient's current anticoagulation dose is Warfarin sodium 4 mg tabs: Use as directed by Anticoagulation Clinic.  The target INR is 2.0-3.0.  The next INR is due 03/19/2010.  Anticoagulation instructions were given to patient/daughter.  Results were reviewed/authorized by Weston Brass, PharmD.  She was notified by [facility].         Prior Anticoagulation Instructions: INR  5.2 Skip today and Wednesdays dose then resume 1/2 tab daily except 1 tab on MWF. Recheck in one week.  Current Anticoagulation Instructions: INR-3.3 Take half a tablet today and resume normal dosing schedule. Take  1 tablet on Monday and Wednesday and Fridayand take half a tablet on all other days.Continue with  the greens in diet. Return in  10 days.

## 2010-11-11 NOTE — Assessment & Plan Note (Signed)
Summary: PC2/DM   Visit Type:  Follow-up Primary Provider:  Jacky Kindle, MD   History of Present Illness: Ms. [name] returns today for a followup. She is a pleasant, 76 year old woman with a history of symptomatic bradycardia, atypical chest pain who was status post pacemaker insertion. She also has longstanding hypertension. She did not have much in the way of complaints today except for intermittant dyspnea. In the past, she has had atypical chest pain but this is not present at the moment. She has had no syncope, she has rare palpitations.   Current Medications (verified): 1)  Benazepril Hcl 20 Mg Tabs (Benazepril Hcl) .Marland Kitchen.. 1 Once Daily 2)  Warfarin Sodium 4 Mg Tabs (Warfarin Sodium) .... Use As Directed By Anticoagulation Clinic 3)  Pravachol 20 Mg Tabs (Pravastatin Sodium) .Marland Kitchen.. 1 Tab By Mouth Once Daily 4)  Glipizide-Metformin Hcl 5-500 Mg Tabs (Glipizide-Metformin Hcl) .... Two Times A Day 5)  Xanax 0.25 Mg Tabs (Alprazolam) .Marland Kitchen.. 1 Two Times A Day 6)  Metoprolol Succinate 50 Mg Xr24h-Tab (Metoprolol Succinate) .... Take One Tablet By Mouth Daily 7)  Furosemide 40 Mg Tabs (Furosemide) .... Take 1 1/2 Once Daily 8)  Klor-Con M20 20 Meq Cr-Tabs (Potassium Chloride Crys Cr) .Marland Kitchen.. 1 Once Daily 9)  Namenda 10 Mg Tabs (Memantine Hcl) .Marland Kitchen.. 1 Once Daily 10)  Nitroglycerin 0.4 Mg Subl (Nitroglycerin) .... One Tablet Under Tongue Every 5 Minutes As Needed For Chest Pain---May Repeat Times Three 11)  Paxil 20 Mg Tabs (Paroxetine Hcl) .... Take 1 Tablet Once Daily  Allergies (verified): No Known Drug Allergies  Past History:  Past Medical History: Last updated: 01/28/2009 Current Problems:  BRADYCARDIA (ICD-427.89) MYOCARDIAL INFARCTION (ICD-410.90) CHF (ICD-428.0) HYPERCHOLESTEROLEMIA (ICD-272.0) SHORTNESS OF BREATH (ICD-786.05) SICK SINUS SYNDROME (ICD-427.81) PAROXYSMAL ATRIAL FIBRILLATION (ICD-427.31) CAD (ICD-414.00) CARDIOMYOPATHY (ICD-425.4) ANXIETY (ICD-300.00) EDEMA  (ICD-782.3)  Anticoagulation therapy with Coumadin.  Past Surgical History: Last updated: 01/28/2009 Status post cardiac catheterization in January of 2006 Status post Adenosine Myoview in April of 2006 Status post dobutamine and Cardiolite in December 2003 pacemaker Status post C-spine surgery in the past.  back surgery hysterectomy   Review of Systems  The patient denies chest pain, syncope, and peripheral edema.    Vital Signs:  Patient profile:   76 year old female Height:      66 inches Weight:      209 pounds BMI:     33.86 Pulse rate:   75 / minute BP sitting:   128 / 60  (left arm)  Vitals Entered By: Laurance Flatten CMA (January 19, 2010 2:05 PM)  Physical Exam  General:  Affect appropriate Healthy:  appears stated age HEENT: normal Neck supple with no adenopathy JVP normal no bruits no thyromegaly Lungs clear with no wheezing  Heart:  S1/S2 no murmur,rub, gallop or click PMI normal Abdomen: benighn, BS positve, no tenderness, no AAA no bruit.  No HSM or HJR Distal pulses intact with no bruits No edema Neuro non-focal Skin warm and dry    PPM Specifications Following MD:  Lewayne Bunting, MD     Referring MD:  [facility] PPM Vendor:  [employer]tr[employer]     PPM Model Number:  [id number]     PPM Serial Number:  [id number] PPM DOI:  11/22/2002     PPM Implanting MD:  Lewayne Bunting, MD  Lead 1    Location: RA     DOI: 11/22/2002     Model #: [id number]     Serial #: [id number]     Status:  active Lead 2    Location: RV     DOI: 11/22/2002     Model #: [id number]     Serial #: [id number]     Status: active  Magnet Response Rate:  ERI 65  Indications:  BRADYCARDIA, AV BLOCK   PPM Follow Up Remote Check?  No Battery Voltage:  2.72 V     Battery Est. Longevity:  23 months     Pacer Dependent:  Yes       PPM Device Measurements Atrium  Amplitude: 2.8 mV, Impedance: 415 ohms, Threshold: 1.0 V at 0.4 msec Right Ventricle  Impedance: 857 ohms, Threshold: 0.5 V at 0.4 msec  Episodes MS  Episodes:  550     Percent Mode Switch:  8.0%     Coumadin:  Yes Atrial Pacing:  56.4%     Ventricular Pacing:  100%  Parameters Mode:  DDDR     Lower Rate Limit:  60     Upper Rate Limit:  130 Paced AV Delay:  150     Sensed AV Delay:  120 Next Cardiology Appt Due:  07/10/2010 Tech Comments:  No parameter changes.  Device function normal.  ROV 6 months clinic.  Checked by [employer]. Altha Harm, LPN  January 19, 2010 2:16 PM  MD Comments:  agree with above.  Impression & Recommendations:  Problem # 1:  PAROXYSMAL ATRIAL FIBRILLATION (ICD-427.31)  She appears to be maintaining NSR.  Continue current meds. Her updated medication list for this problem includes:    Warfarin Sodium 4 Mg Tabs (Warfarin sodium) ..... Use as directed by anticoagulation clinic    Metoprolol Succinate 50 Mg Xr24h-tab (Metoprolol succinate) .Marland Kitchen... Take one tablet by mouth daily  Her updated medication list for this problem includes:    Warfarin Sodium 4 Mg Tabs (Warfarin sodium) ..... Use as directed by anticoagulation clinic    Metoprolol Succinate 50 Mg Xr24h-tab (Metoprolol succinate) .Marland Kitchen... Take one tablet by mouth daily  Problem # 2:  SICK SINUS SYNDROME (ICD-427.81) Her PPM is working normally.  She now has underlying CHB.  She will followup up for PPM check in several months. Her updated medication list for this problem includes:    Benazepril Hcl 20 Mg Tabs (Benazepril hcl) .Marland Kitchen... 1 once daily    Warfarin Sodium 4 Mg Tabs (Warfarin sodium) ..... Use as directed by anticoagulation clinic    Metoprolol Succinate 50 Mg Xr24h-tab (Metoprolol succinate) .Marland Kitchen... Take one tablet by mouth daily    Nitroglycerin 0.4 Mg Subl (Nitroglycerin) ..... One tablet under tongue every 5 minutes as needed for chest pain---may repeat times three  Patient Instructions: 1)  Your physician recommends that you schedule a follow-up appointment in: 6 months in device clinic and 12 months with Dr Ladona Ridgel

## 2010-11-11 NOTE — Medication Information (Signed)
Summary: rov/sp  Anticoagulant Therapy  Managed by: Cloyde Reams, RN, BSN Referring MD: Charlton Haws MD PCP: Jacky Kindle, MD Supervising MD: [name] MD, Theron Arista Indication 1: Atrial Fibrillation (ICD-427.31) Indication 2: Congestive Heart Failure (ICD-428.0) Lab Used: [facility] Site: [facility] INR POC 1.8 INR RANGE 2.0-3.0  Dietary changes: no    Health status changes: no    Bleeding/hemorrhagic complications: no    Recent/future hospitalizations: no    Any changes in medication regimen? no    Recent/future dental: no  Any missed doses?: no       Is patient compliant with meds? yes       Allergies: No Known Drug Allergies  Anticoagulation Management History:      The patient is taking warfarin and comes in today for a routine follow up visit.  Positive risk factors for bleeding include an age of 67 years or older.  The bleeding index is 'intermediate risk'.  Positive CHADS2 values include History of CHF.  Negative CHADS2 values include Age > 2 years old.  The start date was 11/08/2004.  Anticoagulation responsible provider: [name] MD, Theron Arista.  INR POC: 1.8.  Cuvette Lot#: [id number].  Exp: 07/2011.    Anticoagulation Management Assessment/Plan:      The patient's current anticoagulation dose is Warfarin sodium 4 mg tabs: Use as directed by Anticoagulation Clinic.  The target INR is 2.0-3.0.  The next INR is due 05/31/2010.  Anticoagulation instructions were given to patient/daughter.  Results were reviewed/authorized by Cloyde Reams, RN, BSN.  She was notified by Cloyde Reams RN.         Prior Anticoagulation Instructions: INR 1.8  Increase dose to 1/2 tablet every day except 1 tablet on Monday, Wednesday and Friday.   Current Anticoagulation Instructions: INR 1.8  Take 1.5 tablets todays, then resume same dosage 1/2 tablet daily except 1 tablet on Mondays, Wednesdays, and Fridays.  Recheck in 3 weeks.

## 2010-11-11 NOTE — Letter (Signed)
Summary: Appointment - Reminder 2  [facility], Main Office  [address]   [place], [place] [address]   Phone: [phone]  Fax: [phone]     October 15, 2009 MRN: [id number]   [name] [address] [place], [place]  [address]   Dear Ms. Marga Hoots,  Our records indicate that it is time to schedule a follow-up appointment with Dr. [name]. It is very important that we reach you to schedule this appointment. We look forward to participating in your health care needs. Please contact us at the number listed above at your earliest convenience to schedule your appointment.  If you are unable to make an appointment at this time, give [place] a call so we can update our records.  Sincerely,   Migdalia Dk [facility] Scheduling Team

## 2010-11-11 NOTE — Medication Information (Signed)
Summary: rov/sp  Anticoagulant Therapy  Managed by: Weston Brass, PharmD Referring MD: Charlton Haws MD PCP: Jacky Kindle, MD Supervising MD: Jens Som MD, Arlys John Indication 1: Atrial Fibrillation (ICD-427.31) Indication 2: Congestive Heart Failure (ICD-428.0) Lab Used: [facility] Site: [facility] INR POC 1.7 INR RANGE 2.0-3.0  Dietary changes: yes       Details: increased vitamin k   Health status changes: no    Bleeding/hemorrhagic complications: no    Recent/future hospitalizations: no    Any changes in medication regimen? no    Recent/future dental: no  Any missed doses?: no       Is patient compliant with meds? yes       Allergies: No Known Drug Allergies  Anticoagulation Management History:      The patient is taking warfarin and comes in today for a routine follow up visit.  Positive risk factors for bleeding include an age of 74 years or older.  The bleeding index is 'intermediate risk'.  Positive CHADS2 values include History of CHF.  Negative CHADS2 values include Age > 50 years old.  The start date was 11/08/2004.  Anticoagulation responsible provider: Jens Som MD, Arlys John.  INR POC: 1.7.  Exp: 05/2011.    Anticoagulation Management Assessment/Plan:      The patient's current anticoagulation dose is Warfarin sodium 4 mg tabs: Use as directed by Anticoagulation Clinic.  The target INR is 2.0-3.0.  The next INR is due 04/20/2010.  Anticoagulation instructions were given to patient/daughter.  Results were reviewed/authorized by Weston Brass, PharmD.  She was notified by Weston Brass PharmD.         Prior Anticoagulation Instructions: INR 3.1  Take 1/2 tablet today then decrease dose to 1/2 tablet every day except 1 tablet on Monday and Friday.    Current Anticoagulation Instructions: INR 1.7  Take 1 1/2 tablets today then resume same dose of 1/2 tablet every day except 1 tablet on Monday and Friday.

## 2010-11-11 NOTE — Procedures (Signed)
Summary: pacer check   Allergies (verified): No Known Drug Allergies  PPM Specifications Following [name]:  Kayla Bunting, [name]     Referring [name]:  [facility] PPM Vendor:  [employer]     PPM Model Number:  [id number]     PPM Serial Number:  [id number] PPM DOI:  11/22/2002     PPM Implanting [name]:  Kayla Bunting, [name]  Lead 1    Location: RA     DOI: 11/22/2002     Model #: [id number]     Serial #: [id number]     Status: active Lead 2    Location: RV     DOI: 11/22/2002     Model #: [id number]     Serial #: [id number]     Status: active  Magnet Response Rate:  ERI 65  Indications:  BRADYCARDIA, AV BLOCK   PPM Follow Up Remote Check?  No Battery Voltage:  2.70 V     Battery Est. Longevity:  17 months     Pacer Dependent:  Yes       PPM Device Measurements Atrium  Amplitude: 2.8 mV, Impedance: 401 ohms,  Right Ventricle  Impedance: 814 ohms, Threshold: 0.5 V at 0.4 msec  Episodes MS Episodes:  896     Percent Mode Switch:  11.3%     Coumadin:  Yes Ventricular High Rate:  0     Atrial Pacing:  60.2%     Ventricular Pacing:  99.9%  Parameters Mode:  DDDR     Lower Rate Limit:  60     Upper Rate Limit:  130 Paced AV Delay:  150     Sensed AV Delay:  120 Next Remote Date:  08/26/2010     Next Cardiology Appt Due:  01/09/2011 Tech Comments:  No parameter changes.  Device function normal.  A-fib/flutter-dependent, + coumdin.  Carelink transmissions every 3 months. ROV 4/12 with Dr. Ladona Ridgel. Altha Harm, LPN  May 27, 2010 10:21 AM

## 2010-11-11 NOTE — Cardiovascular Report (Signed)
Summary: Office Visit   Office Visit   Imported By: Roderic Ovens 01/22/2010 16:07:20  _____________________________________________________________________  External Attachment:    Type:   Image     Comment:   External Document

## 2010-11-11 NOTE — Cardiovascular Report (Signed)
Summary: Office Visit Remote   Office Visit Remote   Imported By: Roderic Ovens 10/29/2010 11:03:42  _____________________________________________________________________  External Attachment:    Type:   Image     Comment:   External Document

## 2010-11-11 NOTE — Medication Information (Signed)
Summary: rov/tm  Anticoagulant Therapy  Managed by: Louann Sjogren, PharmD Referring MD: Charlton Haws MD PCP: Jacky Kindle, MD Supervising MD: Casen Pryor MD,Ishaq Maffei Indication 1: Atrial Fibrillation (ICD-427.31) Indication 2: Congestive Heart Failure (ICD-428.0) Lab Used: [facility] Site: [facility] INR POC 2.0 INR RANGE 2.0-3.0  Dietary changes: no    Health status changes: no    Bleeding/hemorrhagic complications: no    Recent/future hospitalizations: no    Any changes in medication regimen? no    Recent/future dental: no  Any missed doses?: no       Is patient compliant with meds? yes       Allergies: No Known Drug Allergies  Anticoagulation Management History:      The patient is taking warfarin and comes in today for a routine follow up visit.  Positive risk factors for bleeding include an age of 74 years or older.  The bleeding index is 'intermediate risk'.  Positive CHADS2 values include History of CHF.  Negative CHADS2 values include Age > 70 years old.  The start date was 11/08/2004.  Today's INR is 2.0.  Anticoagulation responsible provider: Katesha Eichel MD,Javan Gonzaga.  INR POC: 2.0.  Cuvette Lot#: [id number].  Exp: 07/2011.    Anticoagulation Management Assessment/Plan:      The patient's current anticoagulation dose is Warfarin sodium 4 mg tabs: Use as directed by Anticoagulation Clinic.  The target INR is 2.0-3.0.  The next INR is due 11/16/2010.  Anticoagulation instructions were given to patient.  Results were reviewed/authorized by Louann Sjogren, PharmD.  She was notified by Louann Sjogren PharmD.         Prior Anticoagulation Instructions: INR 3.0 Continue 1/2 pill everyday except 1 pill on Mondays, Wednesdays and Fridays. Recheck in 4 weeks.   Current Anticoagulation Instructions: INR 2.0 (goal 2-3)  Continue taking 1 tablet on Mondays, Wednesdays, and Fridays and take 1/2 tablet all other days. Next appointment: Tuesday, Feb. 7th at 9:30AM.

## 2010-11-11 NOTE — Progress Notes (Signed)
Summary: c/o headache,   Phone Note Call from Patient Call back at Home Phone [phone]   Caller: Daughter- Financial controller Reason for Call: Talk to Nurse Complaint: Headache Summary of Call: c/o headache , now pt has a knot behind her ear.  more in her neck . pt has appt in am for coumadin.  Initial call taken by: Lorne Skeens,  August 23, 2010 12:56 PM  Follow-up for Phone Call        spoke with pt dtr, questions answered Deliah Goody, RN  August 23, 2010 1:15 PM

## 2010-11-11 NOTE — Cardiovascular Report (Signed)
Summary: Office Visit Remote   Office Visit Remote   Imported By: Roderic Ovens 11/16/2009 16:33:18  _____________________________________________________________________  External Attachment:    Type:   Image     Comment:   External Document

## 2010-11-11 NOTE — Medication Information (Signed)
Summary: rov/tm  Anticoagulant Therapy  Managed by: Weston Brass, PharmD Referring MD: Charlton Haws MD PCP: Jacky Kindle, MD Supervising MD: Excell Seltzer MD, Casimiro Needle Indication 1: Atrial Fibrillation (ICD-427.31) Indication 2: Congestive Heart Failure (ICD-428.0) Lab Used: [facility]  Site: [facility] INR POC 3.1 INR RANGE 2.0-3.0  Dietary changes: no    Health status changes: no    Bleeding/hemorrhagic complications: no    Recent/future hospitalizations: no    Any changes in medication regimen? no    Recent/future dental: no  Any missed doses?: no       Is patient compliant with meds? yes       Allergies: No Known Drug Allergies  Anticoagulation Management History:      The patient is taking warfarin and comes in today for a routine follow up visit.  Positive risk factors for bleeding include an age of 74 years or older.  The bleeding index is 'intermediate risk'.  Positive CHADS2 values include History of CHF.  Negative CHADS2 values include Age > 66 years old.  The start date was 11/08/2004.  Anticoagulation responsible provider: Excell Seltzer MD, Casimiro Needle.  INR POC: 3.1.  Cuvette Lot#: [id number].  Exp: 02/2011.    Anticoagulation Management Assessment/Plan:      The patient's current anticoagulation dose is Warfarin sodium 4 mg tabs: Use as directed by Anticoagulation Clinic.  The target INR is 2.0-3.0.  The next INR is due 02/23/2010.  Anticoagulation instructions were given to patient/daughter.  Results were reviewed/authorized by Weston Brass, PharmD.  She was notified by Weston Brass PharmD.         Prior Anticoagulation Instructions: INR 2.3 Continue 2mg s daily except 4mg s on Mondays, Wednesdays, and Fridays. Recheck in 4 weeks.   Current Anticoagulation Instructions: INR 3.1  Skip today's dose of Coumadin then resume same dose of 1/2 tablet every day except 1 tablet on Monday, Wednesday and Friday

## 2010-11-11 NOTE — Letter (Signed)
Summary: Device-Delinquent Phone Journalist, newspaper, Main Office  [address]   [place], [place] [address]   Phone: [phone]  Fax: [phone]     October 23, 2009 MRN: [id number]   [name] [address] [place], [place]  [address]   Dear [name],  According to our records, you were scheduled for a device phone transmission on  October 21, 2009.     We did not receive any results from this check.  If you transmitted on your scheduled day, please call us to help troubleshoot your system.  If you forgot to send your transmission, please send one upon receipt of this letter.  Thank you,   Architectural technologist [facility]

## 2010-11-11 NOTE — Medication Information (Signed)
Summary: rov/kb  Anticoagulant Therapy  Managed by: Weston Brass, PharmD Referring MD: Charlton Haws MD PCP: Jacky Kindle, MD Supervising MD: Clifton James MD, Cristal Deer Indication 1: Atrial Fibrillation (ICD-427.31) Indication 2: Congestive Heart Failure (ICD-428.0) Lab Used: [facility] Site: [facility] INR POC 3.1 INR RANGE 2.0-3.0  Dietary changes: yes       Details: Pt eating salads but does not like them and doesn't want to eat them as much  Health status changes: no    Bleeding/hemorrhagic complications: no    Recent/future hospitalizations: no    Any changes in medication regimen? no    Recent/future dental: no  Any missed doses?: yes     Details: Pt does not report missing any doses but daughter reports she often does not take medicines correctly   Is patient compliant with meds? yes       Allergies: No Known Drug Allergies  Anticoagulation Management History:      The patient is taking warfarin and comes in today for a routine follow up visit.  Positive risk factors for bleeding include an age of 16 years or older.  The bleeding index is 'intermediate risk'.  Positive CHADS2 values include History of CHF.  Negative CHADS2 values include Age > 51 years old.  The start date was 11/08/2004.  Anticoagulation responsible provider: Clifton James MD, Cristal Deer.  INR POC: 3.1.  Cuvette Lot#: [id number].  Exp: 05/2011.    Anticoagulation Management Assessment/Plan:      The patient's current anticoagulation dose is Warfarin sodium 4 mg tabs: Use as directed by Anticoagulation Clinic.  The target INR is 2.0-3.0.  The next INR is due 04/05/2010.  Anticoagulation instructions were given to patient/daughter.  Results were reviewed/authorized by Weston Brass, PharmD.  She was notified by Weston Brass PharmD.         Prior Anticoagulation Instructions: INR-3.3 Take half a tablet today and resume normal dosing schedule. Take  1 tablet on Monday and Wednesday and Fridayand take half a tablet on all  other days.Continue with the greens in diet. Return in  10 days.  Current Anticoagulation Instructions: INR 3.1  Take 1/2 tablet today then decrease dose to 1/2 tablet every day except 1 tablet on Monday and Friday.

## 2010-11-11 NOTE — Cardiovascular Report (Signed)
Summary: Office Visit   Office Visit   Imported By: Roderic Ovens 06/10/2010 15:48:12  _____________________________________________________________________  External Attachment:    Type:   Image     Comment:   External Document

## 2010-11-11 NOTE — Medication Information (Signed)
Summary: rov/sp  Anticoagulant Therapy  Managed by: Bethena Midget, RN, BSN Referring MD: Charlton Haws MD PCP: Jacky Kindle, MD Supervising MD: Gala Romney MD, Reuel Boom Indication 1: Atrial Fibrillation (ICD-427.31) Indication 2: Congestive Heart Failure (ICD-428.0) Lab Used: [facility] Site: [facility] INR POC 3.0 INR RANGE 2.0-3.0  Dietary changes: no    Health status changes: no    Bleeding/hemorrhagic complications: no    Recent/future hospitalizations: no    Any changes in medication regimen? no    Recent/future dental: no  Any missed doses?: no       Is patient compliant with meds? yes       Allergies: No Known Drug Allergies  Anticoagulation Management History:      The patient is taking warfarin and comes in today for a routine follow up visit.  Positive risk factors for bleeding include an age of 74 years or older.  The bleeding index is 'intermediate risk'.  Positive CHADS2 values include History of CHF.  Negative CHADS2 values include Age > 74 years old.  The start date was 11/08/2004.  Anticoagulation responsible provider: Bensimhon MD, Reuel Boom.  INR POC: 3.0.  Cuvette Lot#: [id number].  Exp: 07/2011.    Anticoagulation Management Assessment/Plan:      The patient's current anticoagulation dose is Warfarin sodium 4 mg tabs: Use as directed by Anticoagulation Clinic.  The target INR is 2.0-3.0.  The next INR is due 10/19/2010.  Anticoagulation instructions were given to patient.  Results were reviewed/authorized by Bethena Midget, RN, BSN.  She was notified by Bethena Midget, RN, BSN.         Prior Anticoagulation Instructions: INR 2.3  Continue same dose of 1/2 tablet every day except 1 tablet on Monday, Wednesday and Friday.  Recheck INR in 4 weeks.   Current Anticoagulation Instructions: INR 3.0 Continue 1/2 pill everyday except 1 pill on Mondays, Wednesdays and Fridays. Recheck in 4 weeks.

## 2010-11-11 NOTE — Letter (Signed)
Summary: Device-Delinquent Phone Journalist, newspaper, Main Office  [address]   [place], [place] [address]   Phone: [phone]  Fax: [phone]     August 31, 2010 MRN: [id number]   [name] [address] [place], [place]  [address]   Dear Ms. BOHANON,  According to our records, you were scheduled for a device phone transmission on 08-26-2010.     We did not receive any results from this check.  If you transmitted on your scheduled day, please call us to help troubleshoot your system.  If you forgot to send your transmission, please send one upon receipt of this letter.  Thank you,   Architectural technologist [facility]

## 2010-11-11 NOTE — Assessment & Plan Note (Signed)
Summary: f4m/dm   CC:  no complaints.  History of Present Illness: Collier is seen today in followup for atrial fibrillation anticoagulation and a history of Taka-Tsubo DCM.  unfortunately she continues to have severe anxiety disorder.  She startles easily and can have unusual episodes of shouting.Marland Kitchen  His affecting the quality of her life as she tends to stay home rather than having people see her like this. Marland Kitchen  Spent quite a lot of time talking with Tayelor because she trusts me. I know her daughter would greatly appreciate the help.  She is seeing a girl names Misty Stanley at [facility] and is doing some better on Paxil.   Her cardiac perspective she is not any significant chest pain.  Her heart function has recovered since her episode of chest pain.  She's not had any palpitations.  Because of her behavioral problems I have taken out of her anticoagulation research study and she is on Coumadin.  He has not had any bleeding problems.  She has a pacer implanted in 2004 for SSS and her transtelephonic checks are fine.    Current Problems (verified): 1)  Osteoarthritis  (ICD-715.90) 2)  Gerd  (ICD-530.81) 3)  Bradycardia  (ICD-427.89) 4)  Myocardial Infarction  (ICD-410.90) 5)  CHF  (ICD-428.0) 6)  Hypercholesterolemia  (ICD-272.0) 7)  Shortness of Breath  (ICD-786.05) 8)  Sick Sinus Syndrome  (ICD-427.81) 9)  Paroxysmal Atrial Fibrillation  (ICD-427.31) 10)  Cad  (ICD-414.00) 11)  Cardiomyopathy  (ICD-425.4) 12)  Anxiety  (ICD-300.00) 13)  Edema  (ICD-782.3)  Current Medications (verified): 1)  Benazepril Hcl 20 Mg Tabs (Benazepril Hcl) .Marland Kitchen.. 1 Once Daily 2)  Warfarin Sodium 4 Mg Tabs (Warfarin Sodium) .... Use As Directed By Anticoagulation Clinic 3)  Pravachol 20 Mg Tabs (Pravastatin Sodium) .Marland Kitchen.. 1 Tab By Mouth Once Daily 4)  Glipizide-Metformin Hcl 5-500 Mg Tabs (Glipizide-Metformin Hcl) .... Two Times A Day 5)  Xanax 0.25 Mg Tabs (Alprazolam) .Marland Kitchen.. 1 Two Times A Day 6)  Metoprolol Succinate  50 Mg Xr24h-Tab (Metoprolol Succinate) .... Take One Tablet By Mouth Daily 7)  Furosemide 40 Mg Tabs (Furosemide) .... Take 1 1/2 Once Daily 8)  Klor-Con M20 20 Meq Cr-Tabs (Potassium Chloride Crys Cr) .Marland Kitchen.. 1 Once Daily 9)  Namenda 10 Mg Tabs (Memantine Hcl) .Marland Kitchen.. 1 Once Daily 10)  Nitroglycerin 0.4 Mg Subl (Nitroglycerin) .... One Tablet Under Tongue Every 5 Minutes As Needed For Chest Pain---May Repeat Times Three 11)  Paxil 20 Mg Tabs (Paroxetine Hcl) .... Take 1 Tablet Once Daily  Allergies (verified): No Known Drug Allergies  Past History:  Past Medical History: Last updated: 2009/02/16 Current Problems:  BRADYCARDIA (ICD-427.89) MYOCARDIAL INFARCTION (ICD-410.90) CHF (ICD-428.0) HYPERCHOLESTEROLEMIA (ICD-272.0) SHORTNESS OF BREATH (ICD-786.05) SICK SINUS SYNDROME (ICD-427.81) PAROXYSMAL ATRIAL FIBRILLATION (ICD-427.31) CAD (ICD-414.00) CARDIOMYOPATHY (ICD-425.4) ANXIETY (ICD-300.00) EDEMA (ICD-782.3)  Anticoagulation therapy with Coumadin.  Past Surgical History: Last updated: Feb 16, 2009 Status post cardiac catheterization in January of 2006 Status post Adenosine Myoview in April of 2006 Status post dobutamine and Cardiolite in December 2003 pacemaker Status post C-spine surgery in the past.  back surgery hysterectomy   Family History: Last updated: 02/16/2009 Her parents both died in their 31's and neither had heart  disease. Her brother is still alive and has a history of heart disease.  Social History: Last updated: 02/16/2009 She lives in [place] alone and is a retired Producer, television/film/video for [employer].  Tobacco Use - No.  Alcohol Use - no  Review of Systems  Denies fever, malais, weight loss, blurry vision, decreased visual acuity, cough, sputum, SOB, hemoptysis, pleuritic pain, palpitaitons, heartburn, abdominal pain, melena, lower extremity edema, claudication, or rash.   Vital Signs:  Patient profile:   76 year old  female Height:      66 inches Weight:      207 pounds BMI:     33.53 Pulse rate:   73 / minute Resp:     12 per minute BP sitting:   122 / 66  (left arm)  Vitals Entered By: Kem Parkinson (December 29, 2009 9:36 AM)  Physical Exam  General:  Affect appropriate Healthy:  appears stated age HEENT: normal Neck supple with no adenopathy JVP normal no bruits no thyromegaly Lungs clear with no wheezing and good diaphragmatic motion Heart:  S1/S2 no murmur,rub, gallop or click PMI normal Abdomen: benighn, BS positve, no tenderness, no AAA no bruit.  No HSM or HJR Distal pulses intact with no bruits No edema Neuro non-focal Skin warm and dry    PPM Specifications Following MD:  Lewayne Bunting, MD     Referring MD:  [facility] PPM Vendor:  [employer]     PPM Model Number:  [id number]     PPM Serial Number:  [id number] PPM DOI:  11/22/2002     PPM Implanting MD:  Lewayne Bunting, MD  Lead 1    Location: RA     DOI: 11/22/2002     Model #: [id number]     Serial #: [id number]     Status: active Lead 2    Location: RV     DOI: 11/22/2002     Model #: [id number]     Serial #: [id number]     Status: active  Magnet Response Rate:  ERI 65  Indications:  BRADYCARDIA, AV BLOCK   PPM Follow Up Pacer Dependent:  Yes      Episodes Coumadin:  Yes  Parameters Mode:  DDDR     Lower Rate Limit:  60     Upper Rate Limit:  130 Paced AV Delay:  150     Sensed AV Delay:  120  Impression & Recommendations:  Problem # 1:  BRADYCARDIA (ICD-427.89) Pacer.  F/U GT in April Her updated medication list for this problem includes:    Benazepril Hcl 20 Mg Tabs (Benazepril hcl) .Marland Kitchen... 1 once daily    Warfarin Sodium 4 Mg Tabs (Warfarin sodium) ..... Use as directed by anticoagulation clinic    Metoprolol Succinate 50 Mg Xr24h-tab (Metoprolol succinate) .Marland Kitchen... Take one tablet by mouth daily    Nitroglycerin 0.4 Mg Subl (Nitroglycerin) ..... One tablet under tongue every 5 minutes as needed for chest pain---may repeat times  three  Problem # 2:  MYOCARDIAL INFARCTION (ICD-410.90) No CAD TakaTsubo with recovery of LV funciton Her updated medication list for this problem includes:    Benazepril Hcl 20 Mg Tabs (Benazepril hcl) .Marland Kitchen... 1 once daily    Warfarin Sodium 4 Mg Tabs (Warfarin sodium) ..... Use as directed by anticoagulation clinic    Metoprolol Succinate 50 Mg Xr24h-tab (Metoprolol succinate) .Marland Kitchen... Take one tablet by mouth daily    Nitroglycerin 0.4 Mg Subl (Nitroglycerin) ..... One tablet under tongue every 5 minutes as needed for chest pain---may repeat times three  Problem # 3:  HYPERCHOLESTEROLEMIA (ICD-272.0) At goal with no vascular disease Her updated medication list for this problem includes:    Pravachol 20 Mg Tabs (Pravastatin sodium) .Marland Kitchen... 1 tab by mouth once daily  Problem # 4:  PAROXYSMAL ATRIAL FIBRILLATION (ICD-427.31) F/U clinic today.  Still a coumadin candidate despite behavioral issues Her updated medication list for this problem includes:    Warfarin Sodium 4 Mg Tabs (Warfarin sodium) ..... Use as directed by anticoagulation clinic    Metoprolol Succinate 50 Mg Xr24h-tab (Metoprolol succinate) .Marland Kitchen... Take one tablet by mouth daily  Patient Instructions: 1)  Your physician recommends that you schedule a follow-up appointment in: 6 MONTHS 2)  DR Ladona Ridgel IN [place]

## 2010-11-11 NOTE — Medication Information (Signed)
Summary: rov/tm  Anticoagulant Therapy  Managed by: Louie Casa, PharmD Referring MD: Charlton Haws MD Supervising MD: Jens Som MD, Arlys John Indication 1: Atrial Fibrillation (ICD-427.31) Indication 2: Congestive Heart Failure (ICD-428.0) Lab Used: [facility] Site: [facility] INR POC 2.6 INR RANGE 2.0-3.0  Dietary changes: no    Health status changes: no    Bleeding/hemorrhagic complications: no    Recent/future hospitalizations: no    Any changes in medication regimen? no    Recent/future dental: no  Any missed doses?: no       Is patient compliant with meds? yes       Current Medications (verified): 1)  Benazepril Hcl 20 Mg Tabs (Benazepril Hcl) .Marland Kitchen.. 1 Once Daily 2)  Warfarin Sodium 4 Mg Tabs (Warfarin Sodium) .... Use As Directed By Anticoagulation Clinic 3)  Pravachol 20 Mg Tabs (Pravastatin Sodium) .Marland Kitchen.. 1 Tab By Mouth Once Daily 4)  Glipizide-Metformin Hcl 5-500 Mg Tabs (Glipizide-Metformin Hcl) .... Two Times A Day 5)  Xanax 0.25 Mg Tabs (Alprazolam) .Marland Kitchen.. 1 Two Times A Day 6)  Metoprolol Tartrate 25 Mg Tabs (Metoprolol Tartrate) .... Take One Tablet By Mouth Twice A Day 7)  Furosemide 40 Mg Tabs (Furosemide) .... Take 1 1/2 Once Daily 8)  Sertraline Hcl 25 Mg Tabs (Sertraline Hcl) .Marland Kitchen.. 1 By Mouth Two Times A Day 9)  Klor-Con M20 20 Meq Cr-Tabs (Potassium Chloride Crys Cr) .Marland Kitchen.. 1 Once Daily 10)  Namenda 10 Mg Tabs (Memantine Hcl) .Marland Kitchen.. 1 Once Daily 11)  Nitroglycerin 0.4 Mg Subl (Nitroglycerin) .... One Tablet Under Tongue Every 5 Minutes As Needed For Chest Pain---May Repeat Times Three 12)  Paxil 20 Mg Tabs (Paroxetine Hcl) .... Take 1 Tablet Once Daily  Allergies (verified): No Known Drug Allergies  Anticoagulation Management History:      The patient is taking warfarin and comes in today for a routine follow up visit.  Positive risk factors for bleeding include an age of 61 years or older.  The bleeding index is 'intermediate risk'.  Positive CHADS2 values  include History of CHF.  Negative CHADS2 values include Age > 83 years old.  The start date was 11/08/2004.  Anticoagulation responsible provider: Jens Som MD, Arlys John.  INR POC: 2.6.  Cuvette Lot#: [id number].  Exp: 11/2010.    Anticoagulation Management Assessment/Plan:      The patient's current anticoagulation dose is Warfarin sodium 4 mg tabs: Use as directed by Anticoagulation Clinic.  The target INR is 0 - 0.  The next INR is due 11/23/2009.  Anticoagulation instructions were given to patient/daughter.  Results were reviewed/authorized by Louie Casa, PharmD.         Prior Anticoagulation Instructions: INR 2.9 Continue 1 pill everyday except 2 pills on Mondays, Wednesdays and Fridays. Recheck in 4 week.   Current Anticoagulation Instructions: INR 2.6  CONTINUE TO TAKE 1 TAB ON MON, WED, FRI AND 1/2 TAB EVERY OTHER DAY.  RECHECK IN 4 WEEKS.

## 2010-11-11 NOTE — Medication Information (Signed)
Summary: rov/sp  Anticoagulant Therapy  Managed by: Weston Brass, PharmD Referring MD: Charlton Haws MD PCP: Jacky Kindle, MD Supervising MD: Clifton James MD, Cristal Deer Indication 1: Atrial Fibrillation (ICD-427.31) Indication 2: Congestive Heart Failure (ICD-428.0) Lab Used: [facility] Site: [facility] INR POC 1.8 INR RANGE 2.0-3.0  Dietary changes: no    Health status changes: no    Bleeding/hemorrhagic complications: no    Recent/future hospitalizations: no    Any changes in medication regimen? no    Recent/future dental: no  Any missed doses?: no       Is patient compliant with meds? yes       Allergies: No Known Drug Allergies  Anticoagulation Management History:      The patient is taking warfarin and comes in today for a routine follow up visit.  Positive risk factors for bleeding include an age of 74 years or older.  The bleeding index is 'intermediate risk'.  Positive CHADS2 values include History of CHF.  Negative CHADS2 values include Age > 41 years old.  The start date was 11/08/2004.  Anticoagulation responsible provider: Clifton James MD, Cristal Deer.  INR POC: 1.8.  Cuvette Lot#: [id number].  Exp: 06/2011.    Anticoagulation Management Assessment/Plan:      The patient's current anticoagulation dose is Warfarin sodium 4 mg tabs: Use as directed by Anticoagulation Clinic.  The target INR is 2.0-3.0.  The next INR is due 05/10/2010.  Anticoagulation instructions were given to patient/daughter.  Results were reviewed/authorized by Weston Brass, PharmD.  She was notified by Weston Brass PharmD.         Prior Anticoagulation Instructions: INR 1.7  Take 1 1/2 tablets today then resume same dose of 1/2 tablet every day except 1 tablet on Monday and Friday.   Current Anticoagulation Instructions: INR 1.8  Increase dose to 1/2 tablet every day except 1 tablet on Monday, Wednesday and Friday.

## 2010-11-11 NOTE — Letter (Signed)
Summary: Remote Device Check  [employer], Main Office  [address]   [place], [place] [address]   Phone: [phone]  Fax: [phone]     November 11, 2009 MRN: [id number]   [name] [address] [place], [place]  [address]   Dear [name],   Your remote transmission was recieved and reviewed by your physician.  All diagnostics were within normal limits for you.    __X____Your next office visit is scheduled for:   APRIL 2011 WITH DR Ladona Ridgel. Please call our office to schedule an appointment.    Sincerely,  Proofreader

## 2010-11-11 NOTE — Medication Information (Signed)
Summary: rov/jm  Anticoagulant Therapy  Managed by: Shelby Dubin, PharmD, BCPS, CPP Referring MD: Charlton Haws MD Supervising MD: Shirlee Latch MD, Freida Busman Indication 1: Atrial Fibrillation (ICD-427.31) Indication 2: Congestive Heart Failure (ICD-428.0) Lab Used: [facility] Site: [facility] INR POC 2.2 INR RANGE 2.0-3.0  Dietary changes: no    Health status changes: no    Bleeding/hemorrhagic complications: no    Recent/future hospitalizations: no    Any changes in medication regimen? no    Recent/future dental: no  Any missed doses?: no       Is patient compliant with meds? yes       Current Medications (verified): 1)  Benazepril Hcl 20 Mg Tabs (Benazepril Hcl) .Marland Kitchen.. 1 Once Daily 2)  Warfarin Sodium 4 Mg Tabs (Warfarin Sodium) .... Use As Directed By Anticoagulation Clinic 3)  Pravachol 20 Mg Tabs (Pravastatin Sodium) .Marland Kitchen.. 1 Tab By Mouth Once Daily 4)  Glipizide-Metformin Hcl 5-500 Mg Tabs (Glipizide-Metformin Hcl) .... Two Times A Day 5)  Xanax 0.25 Mg Tabs (Alprazolam) .Marland Kitchen.. 1 Two Times A Day 6)  Metoprolol Tartrate 25 Mg Tabs (Metoprolol Tartrate) .... Take One Tablet By Mouth Twice A Day 7)  Furosemide 40 Mg Tabs (Furosemide) .... Take 1 1/2 Once Daily 8)  Klor-Con M20 20 Meq Cr-Tabs (Potassium Chloride Crys Cr) .Marland Kitchen.. 1 Once Daily 9)  Namenda 10 Mg Tabs (Memantine Hcl) .Marland Kitchen.. 1 Once Daily 10)  Nitroglycerin 0.4 Mg Subl (Nitroglycerin) .... One Tablet Under Tongue Every 5 Minutes As Needed For Chest Pain---May Repeat Times Three 11)  Paxil 20 Mg Tabs (Paroxetine Hcl) .... Take 1 Tablet Once Daily  Allergies (verified): No Known Drug Allergies  Anticoagulation Management History:      The patient is taking warfarin and comes in today for a routine follow up visit.  Positive risk factors for bleeding include an age of 72 years or older.  The bleeding index is 'intermediate risk'.  Positive CHADS2 values include History of CHF.  Negative CHADS2 values include Age > 63 years old.   The start date was 11/08/2004.  Anticoagulation responsible provider: Shirlee Latch MD, Lourine Alberico.  INR POC: 2.2.  Cuvette Lot#: [id number].  Exp: 01/2011.    Anticoagulation Management Assessment/Plan:      The patient's current anticoagulation dose is Warfarin sodium 4 mg tabs: Use as directed by Anticoagulation Clinic.  The target INR is 2.0-3.0.  The next INR is due 12/21/2009.  Anticoagulation instructions were given to patient/daughter.  Results were reviewed/authorized by Shelby Dubin, PharmD, BCPS, CPP.  She was notified by Shelby Dubin PharmD, BCPS, CPP.         Prior Anticoagulation Instructions: INR 2.6  CONTINUE TO TAKE 1 TAB ON MON, WED, FRI AND 1/2 TAB EVERY OTHER DAY.  RECHECK IN 4 WEEKS.  Current Anticoagulation Instructions: INR 2.2  Continue 0.5 tab daily except 1 tab each Monday, Wednesday, Friday.  Recheck in 4 weeks.

## 2010-11-11 NOTE — Medication Information (Signed)
Summary: [name]  Anticoagulant Therapy  Managed by: Eda Keys, PharmD Referring MD: Charlton Haws MD PCP: Jacky Kindle, MD Supervising MD: Ladona Ridgel MD, Sharlot Gowda Indication 1: Atrial Fibrillation (ICD-427.31) Indication 2: Congestive Heart Failure (ICD-428.0) Lab Used: [facility] Site: [facility] INR POC 2.2 INR RANGE 2.0-3.0  Dietary changes: no    Health status changes: no    Bleeding/hemorrhagic complications: no    Recent/future hospitalizations: no    Any changes in medication regimen? no    Recent/future dental: no  Any missed doses?: no       Is patient compliant with meds? yes       Current Medications (verified): 1)  Benazepril Hcl 20 Mg Tabs (Benazepril Hcl) .Marland Kitchen.. 1 Once Daily 2)  Warfarin Sodium 4 Mg Tabs (Warfarin Sodium) .... Use As Directed By Anticoagulation Clinic 3)  Pravachol 20 Mg Tabs (Pravastatin Sodium) .Marland Kitchen.. 1 Tab By Mouth Once Daily 4)  Glipizide-Metformin Hcl 5-500 Mg Tabs (Glipizide-Metformin Hcl) .... Two Times A Day 5)  Xanax 0.25 Mg Tabs (Alprazolam) .Marland Kitchen.. 1 Two Times A Day 6)  Metoprolol Succinate 50 Mg Xr24h-Tab (Metoprolol Succinate) .... Take One Tablet By Mouth Daily 7)  Furosemide 40 Mg Tabs (Furosemide) .... Take 1 1/2 Once Daily 8)  Klor-Con M20 20 Meq Cr-Tabs (Potassium Chloride Crys Cr) .Marland Kitchen.. 1 Once Daily 9)  Namenda 10 Mg Tabs (Memantine Hcl) .Marland Kitchen.. 1 Once Daily 10)  Nitroglycerin 0.4 Mg Subl (Nitroglycerin) .... One Tablet Under Tongue Every 5 Minutes As Needed For Chest Pain---May Repeat Times Three 11)  Paxil 20 Mg Tabs (Paroxetine Hcl) .... Take 1 Tablet Once Daily  Allergies (verified): No Known Drug Allergies  Anticoagulation Management History:      The patient is taking warfarin and comes in today for a routine follow up visit.  Positive risk factors for bleeding include an age of 73 years or older.  The bleeding index is 'intermediate risk'.  Positive CHADS2 values include History of CHF.  Negative CHADS2 values include Age > 6  years old.  The start date was 11/08/2004.  Anticoagulation responsible provider: Ladona Ridgel MD, Sharlot Gowda.  INR POC: 2.2.  Cuvette Lot#: [id number].  Exp: 08/2011.    Anticoagulation Management Assessment/Plan:      The patient's current anticoagulation dose is Warfarin sodium 4 mg tabs: Use as directed by Anticoagulation Clinic.  The target INR is 2.0-3.0.  The next INR is due 07/15/2010.  Anticoagulation instructions were given to patient/daughter.  Results were reviewed/authorized by Eda Keys, PharmD.  She was notified by Eda Keys.         Prior Anticoagulation Instructions: INR 2.4  TAke 1/2 tablet (2mg ) every day except take 1 tablet (4mg ) every Monday, Wednesday, and Friday.  Recheck in 3 weeks.    Current Anticoagulation Instructions: INR 2.2  Continue taking 1 tablet on Monday, Wednesday, and Friday and 1/2 tablet all other days.  Return to clinic in 4 weeks.

## 2010-11-11 NOTE — Medication Information (Signed)
Summary: coumadin/mt  Anticoagulant Therapy  Managed by: Weston Brass, PharmD Referring MD: Charlton Haws MD PCP: Jacky Kindle, MD Supervising MD: Myrtis Ser MD, Tinnie Gens Indication 1: Atrial Fibrillation (ICD-427.31) Indication 2: Congestive Heart Failure (ICD-428.0) Lab Used: [facility] Site: [facility] INR POC 2.9 INR RANGE 2.0-3.0  Dietary changes: yes       Details: not eating as much   Health status changes: no    Bleeding/hemorrhagic complications: no    Recent/future hospitalizations: yes       Details: pt discharged on 10/7 for CHF exacerbation.  Still some SOB but improved.  Has appt with Dr. [name] next week   Any changes in medication regimen? yes       Details: added spironolactone and d/c'ed potassium   Recent/future dental: no  Any missed doses?: no       Is patient compliant with meds? yes       Current Medications (verified): 1)  Benazepril Hcl 20 Mg Tabs (Benazepril Hcl) .Marland Kitchen.. 1 Once Daily 2)  Warfarin Sodium 4 Mg Tabs (Warfarin Sodium) .... Use As Directed By Anticoagulation Clinic 3)  Pravachol 20 Mg Tabs (Pravastatin Sodium) .Marland Kitchen.. 1 Tab By Mouth Once Daily 4)  Glipizide-Metformin Hcl 5-500 Mg Tabs (Glipizide-Metformin Hcl) .... Two Times A Day 5)  Xanax 0.25 Mg Tabs (Alprazolam) .Marland Kitchen.. 1 Two Times A Day 6)  Metoprolol Succinate 50 Mg Xr24h-Tab (Metoprolol Succinate) .... Take One Tablet By Mouth Daily 7)  Furosemide 40 Mg Tabs (Furosemide) .... Take 1/2 Tablet Two Times A Day 8)  Namenda 10 Mg Tabs (Memantine Hcl) .Marland Kitchen.. 1 Once Daily 9)  Nitroglycerin 0.4 Mg Subl (Nitroglycerin) .... One Tablet Under Tongue Every 5 Minutes As Needed For Chest Pain---May Repeat Times Three 10)  Paxil 20 Mg Tabs (Paroxetine Hcl) .... Take 1 Tablet Once Daily 11)  Spironolactone 25 Mg Tabs (Spironolactone) .... Take 1/2 Tablet By Mouth Daily  Allergies: No Known Drug Allergies  Anticoagulation Management History:      The patient is taking warfarin and comes in today for a routine  follow up visit.  Positive risk factors for bleeding include an age of 67 years or older.  The bleeding index is 'intermediate risk'.  Positive CHADS2 values include History of CHF.  Negative CHADS2 values include Age > 54 years old.  The start date was 11/08/2004.  Anticoagulation responsible provider: Myrtis Ser MD, Tinnie Gens.  INR POC: 2.9.  Cuvette Lot#: [id number].  Exp: 08/2011.    Anticoagulation Management Assessment/Plan:      The patient's current anticoagulation dose is Warfarin sodium 4 mg tabs: Use as directed by Anticoagulation Clinic.  The target INR is 2.0-3.0.  The next INR is due 08/24/2010.  Anticoagulation instructions were given to patient/daughter.  Results were reviewed/authorized by Weston Brass, PharmD.  She was notified by Weston Brass PharmD.         Prior Anticoagulation Instructions: INR 2.2  Continue taking 1 tablet on Monday, Wednesday, and Friday and 1/2 tablet all other days.  Return to clinic in 4 weeks.    Current Anticoagulation Instructions: INR 2.9  Continue same dose of 1/2 tablet every day except 1 tablet on Monday, Wednesday and Friday.  Recheck INR in 4 weeks.

## 2010-11-11 NOTE — Medication Information (Signed)
Summary: rov/sp  Anticoagulant Therapy  Managed by: Weston Brass, PharmD Referring MD: Charlton Haws MD PCP: Jacky Kindle, MD Supervising MD: Excell Seltzer MD, Casimiro Needle Indication 1: Atrial Fibrillation (ICD-427.31) Indication 2: Congestive Heart Failure (ICD-428.0) Lab Used: [facility] Site: [facility] INR POC 2.3 INR RANGE 2.0-3.0  Dietary changes: no    Health status changes: no    Bleeding/hemorrhagic complications: no    Recent/future hospitalizations: no    Any changes in medication regimen? yes       Details: increased furosemide  Recent/future dental: no  Any missed doses?: no       Is patient compliant with meds? yes       Allergies: No Known Drug Allergies  Anticoagulation Management History:      The patient is taking warfarin and comes in today for a routine follow up visit.  Positive risk factors for bleeding include an age of 74 years or older.  The bleeding index is 'intermediate risk'.  Positive CHADS2 values include History of CHF.  Negative CHADS2 values include Age > 12 years old.  The start date was 11/08/2004.  Anticoagulation responsible provider: Excell Seltzer MD, Casimiro Needle.  INR POC: 2.3.  Cuvette Lot#: [id number].  Exp: 09/2011.    Anticoagulation Management Assessment/Plan:      The patient's current anticoagulation dose is Warfarin sodium 4 mg tabs: Use as directed by Anticoagulation Clinic.  The target INR is 2.0-3.0.  The next INR is due 09/21/2010.  Anticoagulation instructions were given to patient/daughter.  Results were reviewed/authorized by Weston Brass, PharmD.  She was notified by Weston Brass PharmD.         Prior Anticoagulation Instructions: INR 2.9  Continue same dose of 1/2 tablet every day except 1 tablet on Monday, Wednesday and Friday.  Recheck INR in 4 weeks.   Current Anticoagulation Instructions: INR 2.3  Continue same dose of 1/2 tablet every day except 1 tablet on Monday, Wednesday and Friday.  Recheck INR in 4 weeks.

## 2010-11-11 NOTE — Cardiovascular Report (Signed)
Summary: Certified Letter Signed - Other (not doing f/u)  Certified Letter Signed - Other (not doing f/u)   Imported By: Debby Freiberg 10/28/2010 11:04:18  _____________________________________________________________________  External Attachment:    Type:   Image     Comment:   External Document

## 2010-11-11 NOTE — Letter (Signed)
Summary: Device-Delinquent [employer] Journal[employer], newspaper, Main Office  [address]   [place], [place] [address]   Phone: [phone]  Fax: [phone]     October 08, 2010 MRN: [id number]   [name] [address] [place], [place]  [address]   Dear [name],  According to our records, you were scheduled for a device phone transmission on 08-26-2010.     We did not receive any results from this check.  If you transmitted on your scheduled day, please call us to help troubleshoot your system.  If you forgot to send your transmission, please send one upon receipt of this letter.  Thank you,  Vella Kohler  October 08, 2010 2:00 PM   [facility] certified

## 2010-11-11 NOTE — Medication Information (Signed)
Summary: rov/sp  Anticoagulant Therapy  Managed by: Bethena Midget, RN, BSN Referring MD: Charlton Haws MD PCP: Jacky Kindle, MD Supervising MD: Gala Romney MD, Reuel Boom Indication 1: Atrial Fibrillation (ICD-427.31) Indication 2: Congestive Heart Failure (ICD-428.0) Lab Used[facility]  Site: [facility] INR POC 5.2 INR RANGE 2.0-3.0  Dietary changes: yes       Details: Pt states that all of last week she didn't feel well and all she ate was cracker.   Health status changes: yes       Details: Went to [facility] for Bladder Infection.   Bleeding/hemorrhagic complications: no    Recent/future hospitalizations: no    Any changes in medication regimen? yes       Details: Was Rx Abx, not sure of name,completed 7days ago  Recent/future dental: no  Any missed doses?: no       Is patient compliant with meds? yes       Allergies: No Known Drug Allergies  Anticoagulation Management History:      The patient is taking warfarin and comes in today for a routine follow up visit.  Positive risk factors for bleeding include an age of 74 years or older.  The bleeding index is 'intermediate risk'.  Positive CHADS2 values include History of CHF.  Negative CHADS2 values include Age > 58 years old.  The start date was 11/08/2004.  Anticoagulation responsible provider: Bensimhon MD, Reuel Boom.  INR POC: 5.2.  Cuvette Lot#: [id number].  Exp: 05/2011.    Anticoagulation Management Assessment/Plan:      The patient's current anticoagulation dose is Warfarin sodium 4 mg tabs: Use as directed by Anticoagulation Clinic.  The target INR is 2.0-3.0.  The next INR is due 03/09/2010.  Anticoagulation instructions were given to patient/daughter.  Results were reviewed/authorized by Bethena Midget, RN, BSN.  She was notified by Bethena Midget, RN, BSN.         Prior Anticoagulation Instructions: INR 3.1  Skip today's dose of Coumadin then resume same dose of 1/2 tablet every day except 1 tablet on Monday, Wednesday and Friday     Current Anticoagulation Instructions: INR  5.2 Skip today and Wednesdays dose then resume 1/2 tab daily except 1 tab on MWF. Recheck in one week.

## 2010-11-11 NOTE — Assessment & Plan Note (Signed)
Summary: F6M/DM   Primary Provider:  Jacky Kindle, MD  CC:  check up.  History of Present Illness: [name] returns today for a followup. She is a pleasant, 76 year old woman with a history of symptomatic bradycardia, atypical chest pain who was status post pacemaker insertion. She also has longstanding hypertension. She did not have much in the way of complaints today except for intermittant dyspnea. In the past, she has had atypical chest pain but this is not present at the moment. She has had no syncope, she has rare palpitations.  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   Current Problems (verified): 1)  Osteoarthritis  (ICD-715.90) 2)  Gerd  (ICD-530.81) 3)  Bradycardia  (ICD-427.89) 4)  Myocardial Infarction  (ICD-410.90) 5)  CHF  (ICD-428.0) 6)  Hypercholesterolemia  (ICD-272.0) 7)  Shortness of Breath  (ICD-786.05) 8)  Sick Sinus Syndrome  (ICD-427.81) 9)  Paroxysmal Atrial Fibrillation  (ICD-427.31) 10)  Cad  (ICD-414.00) 11)  Cardiomyopathy  (ICD-425.4) 12)  Anxiety  (ICD-300.00) 13)  Edema  (ICD-782.3)  Current Medications (verified): 1)  Benazepril Hcl 20 Mg Tabs (Benazepril Hcl) .Marland Kitchen.. 1 Once Daily 2)  Warfarin Sodium 4 Mg Tabs (Warfarin Sodium) .... Use As Directed By Anticoagulation Clinic 3)  Pravachol 20 Mg Tabs (Pravastatin Sodium) .Marland Kitchen.. 1 Tab By Mouth Once Daily 4)  Glipizide-Metformin Hcl 5-500 Mg Tabs (Glipizide-Metformin Hcl) .... Two Times A Day 5)  Xanax 0.25 Mg Tabs (Alprazolam) .Marland Kitchen.. 1 Two Times A Day 6)  Metoprolol Succinate 50 Mg Xr24h-Tab (Metoprolol Succinate) .... Take One Tablet By Mouth Daily 7)  Furosemide 40 Mg Tabs (Furosemide) .... Take 1 1/2 Once Daily 8)  Klor-Con M20 20 Meq Cr-Tabs (Potassium Chloride Crys Cr) .Marland Kitchen.. 1 Once Daily 9)  Namenda 10 Mg Tabs (Memantine Hcl) .Marland Kitchen.. 1 Once Daily 10)  Nitroglycerin 0.4 Mg Subl (Nitroglycerin) .... One Tablet Under Tongue Every 5 Minutes As Needed For Chest Pain---May Repeat Times  Three 11)  Paxil 20 Mg Tabs (Paroxetine Hcl) .... Take 1 Tablet Once Daily  Allergies (verified): No Known Drug Allergies  Past History:  Past Medical History: Last updated: 02-18-09 Current Problems:  BRADYCARDIA (ICD-427.89) MYOCARDIAL INFARCTION (ICD-410.90) CHF (ICD-428.0) HYPERCHOLESTEROLEMIA (ICD-272.0) SHORTNESS OF BREATH (ICD-786.05) SICK SINUS SYNDROME (ICD-427.81) PAROXYSMAL ATRIAL FIBRILLATION (ICD-427.31) CAD (ICD-414.00) CARDIOMYOPATHY (ICD-425.4) ANXIETY (ICD-300.00) EDEMA (ICD-782.3)  Anticoagulation therapy with Coumadin.  Past Surgical History: Last updated: Feb 18, 2009 Status post cardiac catheterization in January of 2006 Status post Adenosine Myoview in April of 2006 Status post dobutamine and Cardiolite in December 2003 pacemaker Status post C-spine surgery in the past.  back surgery hysterectomy   Family History: Last updated: Feb 18, 2009 Her parents both died in their 75's and neither had heart  disease. Her brother is still alive and has a history of heart disease.  Social History: Last updated: 2009/02/18 She lives in [place] alone and is a retired Producer, television/film/video for [employer].  Tobacco Use - No.  Alcohol Use - no  Review of Systems       Denies fever, malais, weight loss, blurry vision, decreased visual acuity, cough, sputum, SOB, hemoptysis, pleuritic pain, palpitaitons, heartburn, abdominal pain, melena, lower extremity edema, claudication, or rash.   Vital Signs:  Patient profile:   76 year old female Height:      66 inches Weight:      213 pounds BMI:     34.50 Pulse rate:   78 / minute Resp:     14 per  minute BP sitting:   120 / 56  (left arm)  Vitals Entered By: Kem Parkinson (July 06, 2010 10:40 AM)  Physical Exam  General:  Affect appropriate Healthy:  appears stated age HEENT: normal Neck supple with no adenopathy JVP normal no bruits no thyromegaly Lungs clear with no wheezing and  good diaphragmatic motion Heart:  S1/S2 no murmur,rub, gallop or click PMI normal Abdomen: benighn, BS positve, no tenderness, no AAA no bruit.  No HSM or HJR Distal pulses intact with no bruits No edema Neuro non-focal Skin warm and dry    PPM Specifications Following MD:  Lewayne Bunting, MD     Referring MD:  [facility] PPM Vendor:  [employer]     PPM Model Number:  [id number]     PPM Serial Number:  [id number] PPM DOI:  11/22/2002     PPM Implanting MD:  Lewayne Bunting, MD  Lead 1    Location: RA     DOI: 11/22/2002     Model #: [id number]     Serial #: [id number]     Status: active Lead 2    Location: RV     DOI: 11/22/2002     Model #: [id number]     Serial #: [id number]     Status: active  Magnet Response Rate:  ERI 65  Indications:  BRADYCARDIA, AV BLOCK   PPM Follow Up Pacer Dependent:  Yes      Episodes Coumadin:  Yes  Parameters Mode:  DDDR     Lower Rate Limit:  60     Upper Rate Limit:  130 Paced AV Delay:  150     Sensed AV Delay:  120  Impression & Recommendations:  Problem # 1:  BRADYCARDIA (ICD-427.89) Pacer working well no syncope.  F/U clinci Her updated medication list for this problem includes:    Benazepril Hcl 20 Mg Tabs (Benazepril hcl) .Marland Kitchen... 1 once daily    Warfarin Sodium 4 Mg Tabs (Warfarin sodium) ..... Use as directed by anticoagulation clinic    Metoprolol Succinate 50 Mg Xr24h-tab (Metoprolol succinate) .Marland Kitchen... Take one tablet by mouth daily    Nitroglycerin 0.4 Mg Subl (Nitroglycerin) ..... One tablet under tongue every 5 minutes as needed for chest pain---may repeat times three  Problem # 2:  MYOCARDIAL INFARCTION (ICD-410.90) No epicardial CAD.  Recovery of LV function  Taka Tsubo resolved Her updated medication list for this problem includes:    Benazepril Hcl 20 Mg Tabs (Benazepril hcl) .Marland Kitchen... 1 once daily    Warfarin Sodium 4 Mg Tabs (Warfarin sodium) ..... Use as directed by anticoagulation clinic    Metoprolol Succinate 50 Mg Xr24h-tab (Metoprolol succinate)  .Marland Kitchen... Take one tablet by mouth daily    Nitroglycerin 0.4 Mg Subl (Nitroglycerin) ..... One tablet under tongue every 5 minutes as needed for chest pain---may repeat times three  Problem # 3:  HYPERCHOLESTEROLEMIA (ICD-272.0) Well controlld labs in 6 months Her updated medication list for this problem includes:    Pravachol 20 Mg Tabs (Pravastatin sodium) .Marland Kitchen... 1 tab by mouth once daily  Problem # 4:  ANXIETY (ICD-300.00) Improved but still has occasional emotional outbursts.  At assisted living now and doing well Prescriptions: NITROGLYCERIN 0.4 MG SUBL (NITROGLYCERIN) One tablet under tongue every 5 minutes as needed for chest pain---may repeat times three  #25 x 12   Entered by:   Kem Parkinson   Authorized by:   Colon Branch, MD, [facility]   Signed by:   Kem Parkinson on  07/06/2010   Method used:   Electronically to        [facility]* (retail)       [address] [place], [place]  [address]       Ph: [phone] or [phone]       Fax: [phone]   RxID:   [id number]

## 2010-11-11 NOTE — Letter (Signed)
Summary: Handout Printed  Printed Handout:  - Coumadin Instructions-w/out Meds 

## 2010-11-11 NOTE — Medication Information (Signed)
Summary: rov/ewj  Anticoagulant Therapy  Managed by: Weston Brass, PharmD Referring MD: Charlton Haws MD PCP: Jacky Kindle, MD Supervising MD: Excell Seltzer MD, Casimiro Needle Indication 1: Atrial Fibrillation (ICD-427.31) Indication 2: Congestive Heart Failure (ICD-428.0) Lab Used: [facility] Site: [facility] INR POC 2.4 INR RANGE 2.0-3.0  Dietary changes: no    Health status changes: no    Bleeding/hemorrhagic complications: no    Recent/future hospitalizations: no    Any changes in medication regimen? no    Recent/future dental: no  Any missed doses?: no       Is patient compliant with meds? yes       Allergies: No Known Drug Allergies  Anticoagulation Management History:      The patient is taking warfarin and comes in today for a routine follow up visit.  Positive risk factors for bleeding include an age of 74 years or older.  The bleeding index is 'intermediate risk'.  Positive CHADS2 values include History of CHF.  Negative CHADS2 values include Age > 74 years old.  The start date was 11/08/2004.  Anticoagulation responsible provider: Excell Seltzer MD, Casimiro Needle.  INR POC: 2.4.  Cuvette Lot#: [id number].  Exp: 07/2011.    Anticoagulation Management Assessment/Plan:      The patient's current anticoagulation dose is Warfarin sodium 4 mg tabs: Use as directed by Anticoagulation Clinic.  The target INR is 2.0-3.0.  The next INR is due 06/17/2010.  Anticoagulation instructions were given to patient/daughter.  Results were reviewed/authorized by Weston Brass, PharmD.  She was notified by Gweneth Fritter, PharmD Candidate.         Prior Anticoagulation Instructions: INR 1.8  Take 1.5 tablets todays, then resume same dosage 1/2 tablet daily except 1 tablet on Mondays, Wednesdays, and Fridays.  Recheck in 3 weeks.    Current Anticoagulation Instructions: INR 2.4  TAke 1/2 tablet (2mg ) every day except take 1 tablet (4mg ) every Monday, Wednesday, and Friday.  Recheck in 3 weeks.

## 2010-11-11 NOTE — Letter (Signed)
Summary: Remote Device Check  [facility], Main Office  [address]   [place], [place] [address]   Phone: [phone]  Fax: [phone]     October 20, 2010 MRN: [id number]   [name] [address] [place], [place]  [address]   Dear Ms. [name],   Your remote transmission was recieved and reviewed by your physician.  All diagnostics were within normal limits for you.  ___X___Your next office visit is scheduled for:   April 2012 with Dr Ladona Ridgel. Please call our office to schedule an appointment.    Sincerely,  Vella Kohler

## 2010-11-11 NOTE — Progress Notes (Signed)
Summary: returned call**pt rtn your call**  Phone Note Call from Patient Call back at Home Phone [phone]   Caller: Daughter Toney Sang [phone] Reason for Call: Talk to Nurse Summary of Call: dtr returned call -pls call after 4p Initial call taken by: Glynda Jaeger,  August 09, 2010 1:42 PM  Follow-up for Phone Call        pt rtn your call Omer Jack  August 09, 2010 4:47 PM   Additional Follow-up for Phone Call Additional follow up Details #1::        Phone Call Completed PT'S DAUGHTER AWARE WILL NOTIY MOM AND ALSO IS AWARE WILL FORWARD LABS TO DR Jacky Kindle. Additional Follow-up by: Scherrie Bateman, LPN,  August 09, 2010 5:09 PM

## 2010-11-16 ENCOUNTER — Encounter: Payer: Self-pay | Admitting: Cardiovascular Disease

## 2010-11-16 ENCOUNTER — Encounter (INDEPENDENT_AMBULATORY_CARE_PROVIDER_SITE_OTHER): Payer: Medicare Other

## 2010-11-16 DIAGNOSIS — I4891 Unspecified atrial fibrillation: Secondary | ICD-10-CM

## 2010-11-16 DIAGNOSIS — Z7901 Long term (current) use of anticoagulants: Secondary | ICD-10-CM

## 2010-11-25 NOTE — Medication Information (Signed)
Summary: [facility]in Clinic  Anticoagulant Therapy  Managed by: Bethena Midget, RN, BSN Referring MD: Charlton Haws MD PCP: Jacky Kindle, MD Supervising MD: [name] MD, Theron Arista Indication 1: Atrial Fibrillation (ICD-427.31) Indication 2: Congestive Heart Failure (ICD-428.0) Lab Used: [facility] Site: [facility] INR POC 2.2 INR RANGE 2.0-3.0  Dietary changes: no    Health status changes: no    Bleeding/hemorrhagic complications: no    Recent/future hospitalizations: no    Any changes in medication regimen? no    Recent/future dental: no  Any missed doses?: no       Is patient compliant with meds? yes       Allergies: No Known Drug Allergies  Anticoagulation Management History:      The patient is taking warfarin and comes in today for a routine follow up visit.  Positive risk factors for bleeding include an age of 59 years or older.  The bleeding index is 'intermediate risk'.  Positive CHADS2 values include History of CHF.  Negative CHADS2 values include Age > 37 years old.  The start date was 11/08/2004.  Her last INR was 2.0.  Anticoagulation responsible provider: [name] MD, Theron Arista.  INR POC: 2.2.  Cuvette Lot#: [id number].  Exp: 10/2011.    Anticoagulation Management Assessment/Plan:      The patient's current anticoagulation dose is Warfarin sodium 4 mg tabs: Use as directed by Anticoagulation Clinic.  The target INR is 2.0-3.0.  The next INR is due 12/14/2010.  Anticoagulation instructions were given to patient.  Results were reviewed/authorized by Bethena Midget, RN, BSN.  She was notified by Bethena Midget, RN, BSN.         Prior Anticoagulation Instructions: INR 2.0 (goal 2-3)  Continue taking 1 tablet on Mondays, Wednesdays, and Fridays and take 1/2 tablet all other days. Next appointment: Tuesday, Feb. 7th at 9:30AM.  Current Anticoagulation Instructions: INR 2.2 Continue 1/2 pill everyday except 1  pill on Mondays, Wednesdays and Fridays. Recheck in 4 weeks.

## 2010-12-08 ENCOUNTER — Encounter: Payer: Self-pay | Admitting: Cardiovascular Disease

## 2010-12-08 DIAGNOSIS — I4891 Unspecified atrial fibrillation: Secondary | ICD-10-CM

## 2010-12-14 ENCOUNTER — Encounter: Payer: Self-pay | Admitting: Cardiology

## 2010-12-14 ENCOUNTER — Encounter (INDEPENDENT_AMBULATORY_CARE_PROVIDER_SITE_OTHER): Payer: Medicare Other

## 2010-12-14 DIAGNOSIS — I4891 Unspecified atrial fibrillation: Secondary | ICD-10-CM

## 2010-12-14 DIAGNOSIS — Z7901 Long term (current) use of anticoagulants: Secondary | ICD-10-CM

## 2010-12-21 NOTE — Medication Information (Signed)
Summary: rov/ewj  Anticoagulant Therapy  Managed by: Samantha Crimes, PharmD Referring MD: Charlton Haws MD PCP: Jacky Kindle, MD Supervising MD: Antoine Poche MD, Fayrene Fearing Indication 1: Atrial Fibrillation (ICD-427.31) Indication 2: Congestive Heart Failure (ICD-428.0) Lab Used: [facility] Site: [facility] INR POC 2.4 INR RANGE 2.0-3.0  Dietary changes: no    Health status changes: no    Bleeding/hemorrhagic complications: no    Recent/future hospitalizations: no    Any changes in medication regimen? no    Recent/future dental: no  Any missed doses?: no       Is patient compliant with meds? yes       Current Medications (verified): 1)  Benazepril Hcl 20 Mg Tabs (Benazepril Hcl) .Marland Kitchen.. 1 Once Daily 2)  Warfarin Sodium 4 Mg Tabs (Warfarin Sodium) .... Use As Directed By Anticoagulation Clinic 3)  Pravachol 20 Mg Tabs (Pravastatin Sodium) .Marland Kitchen.. 1 Tab By Mouth Once Daily 4)  Glipizide-Metformin Hcl 5-500 Mg Tabs (Glipizide-Metformin Hcl) .... Two Times A Day 5)  Xanax 0.25 Mg Tabs (Alprazolam) .Marland Kitchen.. 1 Two Times A Day 6)  Metoprolol Succinate 50 Mg Xr24h-Tab (Metoprolol Succinate) .... Take One Tablet By Mouth Daily 7)  Furosemide 40 Mg Tabs (Furosemide) .... Take 1/2 Tablet Two Times A Day 8)  Namenda 10 Mg Tabs (Memantine Hcl) .Marland Kitchen.. 1 Tab By Mouth Two Times A Day 9)  Nitroglycerin 0.4 Mg Subl (Nitroglycerin) .... One Tablet Under Tongue Every 5 Minutes As Needed For Chest Pain---May Repeat Times Three 10)  Paxil 20 Mg Tabs (Paroxetine Hcl) .... Take 1 Tablet Once Daily 11)  Spironolactone 25 Mg Tabs (Spironolactone) .... Take 1/2 Tablet By Mouth Daily  Allergies (verified): No Known Drug Allergies  Anticoagulation Management History:      Positive risk factors for bleeding include an age of 72 years or older.  The bleeding index is 'intermediate risk'.  Positive CHADS2 values include History of CHF.  Negative CHADS2 values include Age > 38 years old.  The start date was 11/08/2004.  Her  last INR was 2.0.  Anticoagulation responsible provider: Antoine Poche MD, Fayrene Fearing.  INR POC: 2.4.  Exp: 10/2011.    Anticoagulation Management Assessment/Plan:      The patient's current anticoagulation dose is Warfarin sodium 4 mg tabs: Use as directed by Anticoagulation Clinic.  The target INR is 2.0-3.0.  The next INR is due 01/11/2011.  Anticoagulation instructions were given to patient.  Results were reviewed/authorized by Samantha Crimes, PharmD.         Prior Anticoagulation Instructions: INR 2.2 Continue 1/2 pill everyday except 1  pill on Mondays, Wednesdays and Fridays. Recheck in 4 weeks.   Current Anticoagulation Instructions: Cont with current regimen Return to clinic 4/3 @ 945

## 2010-12-22 LAB — GLUCOSE, CAPILLARY
Glucose-Capillary: 107 mg/dL — ABNORMAL HIGH (ref 70–99)
Glucose-Capillary: 133 mg/dL — ABNORMAL HIGH (ref 70–99)
Glucose-Capillary: 142 mg/dL — ABNORMAL HIGH (ref 70–99)
Glucose-Capillary: 161 mg/dL — ABNORMAL HIGH (ref 70–99)
Glucose-Capillary: 188 mg/dL — ABNORMAL HIGH (ref 70–99)
Glucose-Capillary: 189 mg/dL — ABNORMAL HIGH (ref 70–99)
Glucose-Capillary: 196 mg/dL — ABNORMAL HIGH (ref 70–99)
Glucose-Capillary: 229 mg/dL — ABNORMAL HIGH (ref 70–99)
Glucose-Capillary: 230 mg/dL — ABNORMAL HIGH (ref 70–99)
Glucose-Capillary: 240 mg/dL — ABNORMAL HIGH (ref 70–99)

## 2010-12-22 LAB — POCT I-STAT 3, ART BLOOD GAS (G3+)
Bicarbonate: 26.4 mEq/L — ABNORMAL HIGH (ref 20.0–24.0)
O2 Saturation: 99 %
TCO2: 27 mmol/L (ref 0–100)
pCO2 arterial: 36.2 mmHg (ref 35.0–45.0)
pH, Arterial: 7.471 — ABNORMAL HIGH (ref 7.350–7.400)
pO2, Arterial: 111 mmHg — ABNORMAL HIGH (ref 80.0–100.0)

## 2010-12-22 LAB — BASIC METABOLIC PANEL
BUN: 16 mg/dL (ref 6–23)
BUN: 20 mg/dL (ref 6–23)
BUN: 22 mg/dL (ref 6–23)
BUN: 23 mg/dL (ref 6–23)
BUN: 24 mg/dL — ABNORMAL HIGH (ref 6–23)
CO2: 28 mEq/L (ref 19–32)
CO2: 29 mEq/L (ref 19–32)
Calcium: 8.4 mg/dL (ref 8.4–10.5)
Calcium: 8.7 mg/dL (ref 8.4–10.5)
Calcium: 9.3 mg/dL (ref 8.4–10.5)
Chloride: 101 mEq/L (ref 96–112)
Chloride: 98 mEq/L (ref 96–112)
Creatinine, Ser: 1.06 mg/dL (ref 0.4–1.2)
Creatinine, Ser: 1.09 mg/dL (ref 0.4–1.2)
Creatinine, Ser: 1.15 mg/dL (ref 0.4–1.2)
Creatinine, Ser: 1.18 mg/dL (ref 0.4–1.2)
GFR calc Af Amer: 54 mL/min — ABNORMAL LOW (ref >60)
GFR calc Af Amer: 60 mL/min — ABNORMAL LOW (ref >60)
GFR calc non Af Amer: 44 mL/min — ABNORMAL LOW (ref >60)
GFR calc non Af Amer: 46 mL/min — ABNORMAL LOW (ref >60)
GFR calc non Af Amer: 49 mL/min — ABNORMAL LOW (ref >60)
Glucose, Bld: 212 mg/dL — ABNORMAL HIGH (ref 70–99)
Glucose, Bld: 280 mg/dL — ABNORMAL HIGH (ref 70–99)
Potassium: 3.4 mEq/L — ABNORMAL LOW (ref 3.5–5.1)
Potassium: 4.1 mEq/L (ref 3.5–5.1)
Sodium: 139 mEq/L (ref 135–145)

## 2010-12-22 LAB — CBC
HCT: 33.3 % — ABNORMAL LOW (ref 36.0–46.0)
HCT: 34.6 % — ABNORMAL LOW (ref 36.0–46.0)
Hemoglobin: 11.5 g/dL — ABNORMAL LOW (ref 12.0–15.0)
Hemoglobin: 11.6 g/dL — ABNORMAL LOW (ref 12.0–15.0)
MCH: 31.1 pg (ref 26.0–34.0)
MCH: 32 pg (ref 26.0–34.0)
MCHC: 33.2 g/dL (ref 30.0–36.0)
MCHC: 34.8 g/dL (ref 30.0–36.0)
MCV: 92.6 fL (ref 78.0–100.0)
Platelets: 293 10*3/uL (ref 150–400)
RBC: 3.63 MIL/uL — ABNORMAL LOW (ref 3.87–5.11)
RDW: 13.3 % (ref 11.5–15.5)
WBC: 9.9 10*3/uL (ref 4.0–10.5)

## 2010-12-22 LAB — CARDIAC PANEL(CRET KIN+CKTOT+MB+TROPI)
CK, MB: 2.2 ng/mL (ref 0.3–4.0)
Total CK: 147 U/L (ref 7–177)
Troponin I: 0.19 ng/mL — ABNORMAL HIGH (ref 0.00–0.06)
Troponin I: 0.26 ng/mL — ABNORMAL HIGH (ref 0.00–0.06)

## 2010-12-22 LAB — URINALYSIS, ROUTINE W REFLEX MICROSCOPIC
Bilirubin Urine: NEGATIVE
Nitrite: NEGATIVE
Specific Gravity, Urine: 1.006 (ref 1.005–1.030)
pH: 7 (ref 5.0–8.0)

## 2010-12-22 LAB — CULTURE, BLOOD (ROUTINE X 2)
Culture  Setup Time: 201110021758
Culture  Setup Time: 201110021758
Culture: NO GROWTH

## 2010-12-22 LAB — COMPREHENSIVE METABOLIC PANEL
ALT: 37 U/L — ABNORMAL HIGH (ref 0–35)
CO2: 24 mEq/L (ref 19–32)
Calcium: 9 mg/dL (ref 8.4–10.5)
GFR calc non Af Amer: 47 mL/min — ABNORMAL LOW (ref >60)
Glucose, Bld: 239 mg/dL — ABNORMAL HIGH (ref 70–99)
Sodium: 136 mEq/L (ref 135–145)
Total Bilirubin: 0.9 mg/dL (ref 0.3–1.2)

## 2010-12-22 LAB — PROTIME-INR
Prothrombin Time: 22.5 seconds — ABNORMAL HIGH (ref 11.6–15.2)
Prothrombin Time: 24.4 seconds — ABNORMAL HIGH (ref 11.6–15.2)
Prothrombin Time: 27.5 seconds — ABNORMAL HIGH (ref 11.6–15.2)

## 2010-12-22 LAB — CK TOTAL AND CKMB (NOT AT ARMC)
CK, MB: 4.3 ng/mL — ABNORMAL HIGH (ref 0.3–4.0)
Relative Index: 3.9 — ABNORMAL HIGH (ref 0.0–2.5)

## 2010-12-22 LAB — TSH: TSH: 2.648 u[IU]/mL (ref 0.350–4.500)

## 2010-12-22 LAB — POCT I-STAT 3, VENOUS BLOOD GAS (G3P V)
Bicarbonate: 26 mEq/L — ABNORMAL HIGH (ref 20.0–24.0)
pCO2, Ven: 43.9 mmHg — ABNORMAL LOW (ref 45.0–50.0)
pH, Ven: 7.381 — ABNORMAL HIGH (ref 7.250–7.300)
pO2, Ven: 26 mmHg — CL (ref 30.0–45.0)

## 2010-12-22 LAB — MRSA PCR SCREENING: MRSA by PCR: NEGATIVE

## 2010-12-22 LAB — HEPARIN LEVEL (UNFRACTIONATED): Heparin Unfractionated: <0.1 IU/mL — ABNORMAL LOW (ref 0.30–0.70)

## 2010-12-22 LAB — SEDIMENTATION RATE
Sed Rate: 70 mm/hr — ABNORMAL HIGH (ref 0–22)
Sed Rate: 92 mm/hr — ABNORMAL HIGH (ref 0–22)

## 2010-12-23 LAB — BASIC METABOLIC PANEL
BUN: 17 mg/dL (ref 6–23)
CO2: 19 mEq/L (ref 19–32)
Calcium: 9 mg/dL (ref 8.4–10.5)
Chloride: 104 mEq/L (ref 96–112)
Creatinine, Ser: 1.17 mg/dL (ref 0.4–1.2)
GFR calc Af Amer: 55 mL/min — ABNORMAL LOW (ref >60)
GFR calc non Af Amer: 45 mL/min — ABNORMAL LOW (ref >60)
Glucose, Bld: 328 mg/dL — ABNORMAL HIGH (ref 70–99)
Potassium: 4.5 mEq/L (ref 3.5–5.1)
Sodium: 136 mEq/L (ref 135–145)

## 2010-12-23 LAB — PROTIME-INR
INR: 2.26 — ABNORMAL HIGH (ref 0.00–1.49)
Prothrombin Time: 25.1 seconds — ABNORMAL HIGH (ref 11.6–15.2)

## 2010-12-23 LAB — CBC
HCT: 37 % (ref 36.0–46.0)
Hemoglobin: 12.2 g/dL (ref 12.0–15.0)
MCH: 31 pg (ref 26.0–34.0)
MCHC: 33 g/dL (ref 30.0–36.0)
MCV: 94.1 fL (ref 78.0–100.0)
Platelets: 319 K/uL (ref 150–400)
RBC: 3.93 MIL/uL (ref 3.87–5.11)
RDW: 13.2 % (ref 11.5–15.5)
WBC: 16 K/uL — ABNORMAL HIGH (ref 4.0–10.5)

## 2010-12-23 LAB — BRAIN NATRIURETIC PEPTIDE: Pro B Natriuretic peptide (BNP): 992 pg/mL — ABNORMAL HIGH (ref 0.0–100.0)

## 2010-12-23 LAB — DIFFERENTIAL
Basophils Absolute: 0 K/uL (ref 0.0–0.1)
Basophils Relative: 0 % (ref 0–1)
Eosinophils Absolute: 0 K/uL (ref 0.0–0.7)
Eosinophils Relative: 0 % (ref 0–5)
Lymphocytes Relative: 9 % — ABNORMAL LOW (ref 12–46)
Lymphs Abs: 1.5 K/uL (ref 0.7–4.0)
Monocytes Absolute: 0.8 K/uL (ref 0.1–1.0)
Monocytes Relative: 5 % (ref 3–12)
Neutro Abs: 13.7 K/uL — ABNORMAL HIGH (ref 1.7–7.7)
Neutrophils Relative %: 85 % — ABNORMAL HIGH (ref 43–77)

## 2010-12-23 LAB — POCT CARDIAC MARKERS
CKMB, poc: 1.8 ng/mL (ref 1.0–8.0)
Myoglobin, poc: 166 ng/mL (ref 12–200)
Troponin i, poc: <0.05 ng/mL (ref 0.00–0.09)

## 2010-12-28 LAB — BASIC METABOLIC PANEL
CO2: 27 mEq/L (ref 19–32)
Calcium: 9.7 mg/dL (ref 8.4–10.5)
Creatinine, Ser: 0.94 mg/dL (ref 0.4–1.2)
Glucose, Bld: 228 mg/dL — ABNORMAL HIGH (ref 70–99)
Sodium: 142 mEq/L (ref 135–145)

## 2010-12-28 LAB — URINALYSIS, ROUTINE W REFLEX MICROSCOPIC
Bilirubin Urine: NEGATIVE
Glucose, UA: NEGATIVE mg/dL
Hgb urine dipstick: NEGATIVE
Ketones, ur: NEGATIVE mg/dL
pH: 6.5 (ref 5.0–8.0)

## 2010-12-28 LAB — DIFFERENTIAL
Basophils Absolute: 0.1 10*3/uL (ref 0.0–0.1)
Basophils Relative: 1 % (ref 0–1)
Monocytes Absolute: 0.5 10*3/uL (ref 0.1–1.0)
Neutro Abs: 6 10*3/uL (ref 1.7–7.7)

## 2010-12-28 LAB — URINE CULTURE: Colony Count: >=100000

## 2010-12-28 LAB — CBC
Hemoglobin: 12.5 g/dL (ref 12.0–15.0)
MCHC: 34.5 g/dL (ref 30.0–36.0)
RDW: 13.4 % (ref 11.5–15.5)

## 2010-12-28 LAB — URINE MICROSCOPIC-ADD ON

## 2011-01-11 ENCOUNTER — Ambulatory Visit (INDEPENDENT_AMBULATORY_CARE_PROVIDER_SITE_OTHER): Payer: Medicare Other | Admitting: *Deleted

## 2011-01-11 DIAGNOSIS — Z7901 Long term (current) use of anticoagulants: Secondary | ICD-10-CM | POA: Insufficient documentation

## 2011-01-11 DIAGNOSIS — I4891 Unspecified atrial fibrillation: Secondary | ICD-10-CM

## 2011-01-13 LAB — DIFFERENTIAL
Basophils Absolute: 0.1 10*3/uL (ref 0.0–0.1)
Basophils Relative: 1 % (ref 0–1)
Eosinophils Absolute: 0.2 10*3/uL (ref 0.0–0.7)
Lymphs Abs: 2.4 10*3/uL (ref 0.7–4.0)
Neutrophils Relative %: 68 % (ref 43–77)

## 2011-01-13 LAB — PROTIME-INR
INR: 2.86 — ABNORMAL HIGH (ref 0.00–1.49)
Prothrombin Time: 29.8 seconds — ABNORMAL HIGH (ref 11.6–15.2)

## 2011-01-13 LAB — POCT I-STAT, CHEM 8
Calcium, Ion: 1.15 mmol/L (ref 1.12–1.32)
HCT: 37 % (ref 36.0–46.0)
Hemoglobin: 12.6 g/dL (ref 12.0–15.0)
TCO2: 25 mmol/L (ref 0–100)

## 2011-01-13 LAB — CBC
MCHC: 34.8 g/dL (ref 30.0–36.0)
MCV: 93.4 fL (ref 78.0–100.0)
Platelets: 296 10*3/uL (ref 150–400)
RDW: 13.7 % (ref 11.5–15.5)
WBC: 9.9 10*3/uL (ref 4.0–10.5)

## 2011-01-13 LAB — POCT CARDIAC MARKERS
CKMB, poc: <1 ng/mL — ABNORMAL LOW (ref 1.0–8.0)
Troponin i, poc: <0.05 ng/mL (ref 0.00–0.09)

## 2011-01-28 ENCOUNTER — Other Ambulatory Visit: Payer: Self-pay | Admitting: *Deleted

## 2011-01-28 MED ORDER — FUROSEMIDE 40 MG PO TABS: 20.0000 mg | ORAL_TABLET | Freq: Two times a day (BID) | ORAL | Status: DC

## 2011-02-01 ENCOUNTER — Encounter: Payer: Self-pay | Admitting: Internal Medicine

## 2011-02-02 ENCOUNTER — Encounter: Payer: Self-pay | Admitting: Internal Medicine

## 2011-02-02 ENCOUNTER — Ambulatory Visit (INDEPENDENT_AMBULATORY_CARE_PROVIDER_SITE_OTHER): Payer: Medicare Other | Admitting: Internal Medicine

## 2011-02-02 ENCOUNTER — Encounter: Payer: Self-pay | Admitting: *Deleted

## 2011-02-02 ENCOUNTER — Ambulatory Visit (INDEPENDENT_AMBULATORY_CARE_PROVIDER_SITE_OTHER): Payer: Medicare Other | Admitting: *Deleted

## 2011-02-02 DIAGNOSIS — Z950 Presence of cardiac pacemaker: Secondary | ICD-10-CM

## 2011-02-02 DIAGNOSIS — I4891 Unspecified atrial fibrillation: Secondary | ICD-10-CM

## 2011-02-02 DIAGNOSIS — R0602 Shortness of breath: Secondary | ICD-10-CM

## 2011-02-02 DIAGNOSIS — E7800 Pure hypercholesterolemia, unspecified: Secondary | ICD-10-CM

## 2011-02-02 DIAGNOSIS — I498 Other specified cardiac arrhythmias: Secondary | ICD-10-CM

## 2011-02-02 LAB — POCT INR: INR: 2.7

## 2011-02-02 NOTE — Patient Instructions (Signed)
Your physician has recommended that you have a pacemaker generator change

## 2011-02-02 NOTE — Assessment & Plan Note (Signed)
Her symptoms remain class II. She will continue her current medications. 

## 2011-02-02 NOTE — Assessment & Plan Note (Signed)
She will continue to try to maintain a low-fat diet. She will continue her statin therapy.

## 2011-02-02 NOTE — Assessment & Plan Note (Signed)
Her device is working normally but is at elective replacement. We'll plan to reschedule generator change next few weeks.

## 2011-02-02 NOTE — Progress Notes (Signed)
HPI [name] returns today for followup. She is a pleasant 76 year old woman with a history of symptomatic bradycardia and complete heart block. She is status post pacemaker insertion. She has reached elective replacement indication on her pacemaker. The patient has been stable. She denies shortness of breath peripheral edema or chest discomfort. She admits to dietary indiscretion. No Known Allergies   Current Outpatient Prescriptions  Medication Sig Dispense Refill  . ALPRAZolam (XANAX) 0.25 MG tablet Take 0.25 mg by mouth at bedtime as needed.        . benazepril (LOTENSIN) 20 MG tablet Take 20 mg by mouth daily.        . furosemide (LASIX) 40 MG tablet 1 1/2 tab po bid       . glipiZIDE-metformin (METAGLIP) 5-500 MG per tablet Take 1 tablet by mouth 2 (two) times daily before a meal.        . memantine (NAMENDA) 10 MG tablet Take 10 mg by mouth 2 (two) times daily.        . metoprolol (TOPROL-XL) 50 MG 24 hr tablet Take 50 mg by mouth daily.        . nitroGLYCERIN (NITROSTAT) 0.4 MG SL tablet Place 0.4 mg under the tongue every 5 (five) minutes as needed.        . pravastatin (PRAVACHOL) 20 MG tablet Take 20 mg by mouth daily.        Marland Kitchen spironolactone (ALDACTONE) 25 MG tablet 1/2 tab po qd      . warfarin (COUMADIN) 4 MG tablet Take by mouth as directed.        Marland Kitchen DISCONTD: furosemide (LASIX) 40 MG tablet Take 0.5 tablets (20 mg total) by mouth 2 (two) times daily.  30 tablet  8     Past Medical History  Diagnosis Date  . HYPERCHOLESTEROLEMIA   . ANXIETY   . MYOCARDIAL INFARCTION   . CAD   . CARDIOMYOPATHY   . PAROXYSMAL ATRIAL FIBRILLATION   . SICK SINUS SYNDROME   . BRADYCARDIA   . CHF   . GERD   . Edema   . Shortness of breath     ROS:   All systems reviewed and negative except as noted in the HPI.   Past Surgical History  Procedure Date  . Coronary angioplasty   . Pacemaker insertion   . Cervical spine surgery   . Back surgery   . Radical hysterectomy       No family history on file.   History   Social History  . Marital Status: Widowed    Spouse Name: N/A    Number of Children: N/A  . Years of Education: N/A   Occupational History  . Not on file.   Social History Main Topics  . Smoking status: Never Smoker   . Smokeless tobacco: Not on file  . Alcohol Use: Not on file  . Drug Use: Not on file  . Sexually Active: Not on file   Other Topics Concern  . Not on file   Social History Narrative  . No narrative on file     BP 120/75  Pulse 65  Resp 14  Ht 5\' 7"  (1.702 m)  Wt 204 lb (92.534 kg)  BMI 31.95 kg/m2  Physical Exam:  Well appearing NAD HEENT: Unremarkable Neck:  No JVD, no thyromegally Lymphatics:  No adenopathy Back:  No CVA tenderness Lungs:  Clear. Well-healed pacemaker incision HEART:  Regular rate rhythm, no murmurs, no rubs, no clicks Abd:  Flat, positive bowel sounds, no organomegally, no rebound, no guarding Ext:  2 plus pulses, no edema, no cyanosis, no clubbing Skin:  No rashes no nodules Neuro:  CN II through XII intact, motor grossly intact DEVICE  Normal device function.  See PaceArt for details.   Assess/Plan:

## 2011-02-14 ENCOUNTER — Other Ambulatory Visit (INDEPENDENT_AMBULATORY_CARE_PROVIDER_SITE_OTHER): Payer: Medicare Other | Admitting: *Deleted

## 2011-02-14 DIAGNOSIS — Z01810 Encounter for preprocedural cardiovascular examination: Secondary | ICD-10-CM

## 2011-02-14 DIAGNOSIS — I4891 Unspecified atrial fibrillation: Secondary | ICD-10-CM

## 2011-02-14 LAB — CBC WITH DIFFERENTIAL/PLATELET
Eosinophils Absolute: 0.3 10*3/uL (ref 0.0–0.7)
MCHC: 34.6 g/dL (ref 30.0–36.0)
MCV: 92.7 fl (ref 78.0–100.0)
Monocytes Absolute: 0.7 10*3/uL (ref 0.1–1.0)
Neutrophils Relative %: 62.2 % (ref 43.0–77.0)
Platelets: 336 10*3/uL (ref 150.0–400.0)
RDW: 14 % (ref 11.5–14.6)

## 2011-02-14 LAB — BASIC METABOLIC PANEL
BUN: 29 mg/dL — ABNORMAL HIGH (ref 6–23)
CO2: 28 mEq/L (ref 19–32)
Chloride: 98 mEq/L (ref 96–112)
Creatinine, Ser: 1.1 mg/dL (ref 0.4–1.2)
Glucose, Bld: 118 mg/dL — ABNORMAL HIGH (ref 70–99)

## 2011-02-14 LAB — PROTIME-INR
INR: 2.1 ratio — ABNORMAL HIGH (ref 0.8–1.0)
Prothrombin Time: 22 s — ABNORMAL HIGH (ref 10.2–12.4)

## 2011-02-21 ENCOUNTER — Ambulatory Visit (HOSPITAL_COMMUNITY)
Admission: RE | Admit: 2011-02-21 | Discharge: 2011-02-21 | Disposition: A | Discharge status: Home / Self Care | Payer: Medicare Other | Source: Ambulatory Visit | Attending: Internal Medicine | Admitting: Internal Medicine

## 2011-02-21 DIAGNOSIS — I442 Atrioventricular block, complete: Secondary | ICD-10-CM

## 2011-02-21 DIAGNOSIS — Z45018 Encounter for adjustment and management of other part of cardiac pacemaker: Secondary | ICD-10-CM | POA: Insufficient documentation

## 2011-02-22 NOTE — Assessment & Plan Note (Signed)
[facility]                            CARDIOLOGY OFFICE NOTE   KENNIS, BUELL                         MRN:          [id number]  DATE:06/28/2007                            DOB:          12/30/1934    [name] returns today for followup.  She has paroxysmal atrial  fibrillation, hypertension.  She had an anterior wall MI with clean  coronaries thought to be Takotsubo disease.  She has severe anxiety.  I  am not sure if she has a neurological problem, as she is very jittery.   From a cardiac standpoint, she continues to complain of atypical chest  pain.  The pain is central in her chest, it radiates to her arm, it is  nonexertional, it can be intermittent, does not relieve with  nitroglycerin.  She has chronic shortness of breath which sounds more  like anxiety to me.   She has been compliant with her medications.   Adryan has anxiety attacks when you talk to her about followup stress  tests.  She had clean coronaries during her anterior wall MI.  I have  decided to follow her clinically.   This was in January of 2006 that she had her MI.   Her EF has recovered into the normal range by echocardiography.   MEDICATIONS:  1. Toprol 50 a day.  2. Benazepril 20 a day.  3. Coumadin as directed.  4. Zoloft 40 a day.  5. Glipizide 5/500 b.i.d.  6. Lasix 60 a day.  7. Pravastatin 20 a day.  8. K-Dur 20 a day.   REVIEW OF SYSTEMS:  Otherwise negative.  She needs to get her Coumadin  level checked today.  She has not had any recurrent palpitations or PAF.  She usually gets her pacemaker interrogated in the office since she  cannot handle phone telemetry.   EXAM:  Remarkable for an agitated, somewhat anxious white female who is  chronically ill.  She is oriented.  Weight is 211, blood pressure  130/80, pulse is 88.  She appears to be paced with occasional PVCs.  Respiratory rate is 16, she is afebrile.  HEENT:  Normal.  CAROTIDS:  Normal without  bruits.  There is no lymphadenopathy.  There  is no thyromegaly, no JVP elevation.  LUNGS:  Clear with good diaphragmatic motion.  No wheezing.  There is an  S1, S2 with normal heart sounds.  PMI:  Normal.  ABDOMEN:  Benign.  Bowel sounds are positive.  No AAA, no bruits, no  tenderness.  No hepatosplenomegaly or hepatojugular reflux.  Femorals were +3 bilaterally, PTs were +2.  She has varicosities with  mild lower extremity edema.  NEURO:  Nonfocal but she is somewhat tremulous.  Skin is warm and dry,  there is no muscular weakness.   EKG shows ventricular pacing.   IMPRESSION:  1. Stable history of anterior wall myocardial infarction with recovery      of left ventricular function, Takotsubo disease.  I do believe she      did have a 70% diagonal lesion.  Continue medical therapy.  Chest      pain is atypical.  Aspirin and beta-blocker in order.  2. History of paroxysmal atrial fibrillation with sick sinus syndrome.      Continue Coumadin, low-dose aspirin.  Given her concomitant      coronary disease, followup at the Coumadin Clinic today.  3. Sick sinus syndrome with backup pacing.  Follow up in October in      the pacemaker clinic.  EKGs look fine with demand pacing.  4. Lower extremity edema secondary to varicosities.  Continue current      dose of Lasix 60 milligrams a day.  5. Anxiety disorder.  Follow up with primary care MD.  May need      something besides Zoloft.  She seems particularly jittery today.      Followup TSH and T4 may be in order.     [name]. [name], MD, [facility]  Electronically Signed    PCN/MedQ  DD: 06/28/2007  DT: 06/28/2007  Job #: [id number]

## 2011-02-22 NOTE — Assessment & Plan Note (Signed)
[facility]                            CARDIOLOGY OFFICE NOTE   [name]                         MRN:          [id number]  DATE:06/18/2008                            DOB:          03-23-1935    [name] returns today for followup.  She had a history of Takotsubo  cardiomyopathy with an acute anterior wall MI without significant  coronary artery disease.  She has PAF was sick sinus syndrome status  post pacemaker placement.  She continues to be nervous.  She gets  anxiety attacks at night.  She has had multiple episodes where she gets  a bit of shortness of breath, has to wake up and take a Xanax and Lasix  and her psychoactive drugs have been switched a little bit.  She is no  longer on Zoloft.  She is now on sertraline and Namenda; however, she  continues to be on it.   We tried to reassure her about her heart. Her pacemaker seems to be  working fine.   Her LV function seems to have recovered by a most recent echo.   She is not having any significant chest pain.  There was a bit of fiasco  with Dr. Lanell Matar office.  She complained of some left lower extremity  pain.  She apparently got arterial duplex study done.  She was then  called by their staff saying she had a clot in her leg that she needed  to stay off her legs and elevate them.  She subsequently got worried and  called our office.  Since she was told she had a clot in her leg, I  assumed she had a venous duplex and ordered one here which showed no  evidence of clot.  She initially was told that we do not do vascular  studies at our office anymore and that is why the study was done at Dr.  Lanell Matar office.  I told her I could not explain all the  miscommunications, but clearly she has no significant arterial or deep  venous disease.  She does have significant superficial varicosities in  both legs and she may have some pain from this.   Review of systems, otherwise negative.   MEDICATIONS:  1. Toprol 50 a day.  2. Benazepril 20 a day.  3. Coumadin as directed for PAF.  4. Glipizide 5/500 b.i.d.  5. Xanax.  6. Lasix 60 a day.  7. Pravastatin 20 a day.  8. K-Dur 20 a day.  9. Sertraline 25 b.i.d.  10.Namenda 10 b.i.d.   PHYSICAL EXAMINATION:  GENERAL:  Remarkable for an excitable white  female in no distress.  VITAL SIGNS:  Weight is 216, blood pressure 140/70, pulse is 70 and  paced.  HEENT:  Unremarkable.  NECK:  Carotids are normal without bruit.  No lymphadenopathy,  thyromegaly, or JVP elevation.  LUNGS:  Clear.  Good diaphragmatic motion.  No wheezing.  HEART:  S1 and S2.  Normal heart sounds.  PMI normal.  ABDOMEN:  Benign.  Bowel sounds positive.  No AAA.  No tenderness.  No  bruit.  No hepatosplenomegaly.  No hepatojugular reflux.  No tenderness.  EXTREMITIES:  Distal pulses are intact.  She has trace edema bilaterally  with bilateral superficial varicosities.  Arterial pulses are +3.  NEUROLOGIC:  Nonfocal.  SKIN:  Warm and dry.  No muscular weakness.   IMPRESSION:  1. Previous Takotsubo cardiomyopathy secondary to anxiety, continue      current dose of Lasix, left ventricular function improved, to      follow up echo in a year.  2. Hypertension currently well controlled.  Continue current dose of      benazepril, low-salt diet.  3. History of paroxysmal atrial fibrillation.  Continue Coumadin.      Follow up in the clinic next week.  INRs have been therapeutic.  4. Sick sinus syndrome with pacemaker therapy.  Follow up in the Pacer      Clinic in 3 months.  5. Hypercholesterolemia.  Continue pravastatin 20 a day.  Lipid and      liver profile in 6 months.  6. Anxiety and depression, one of [name]'s biggest issues.  Apparently,      her medicines have been adjusted.  She is now on sertraline and      Namenda.  Follow up with Dr. Jacky Kindle.  7. Superficial varicosities, currently stable.  I do not think [name]      needs to have referral for  sclerosis.  The vein seemed to be      noninflamed and not an issue at this point   I will see her back in 6 months.  She will try to get her flu shot when  she is at our Coumadin Clinic.     [name]. [name], MD, [facility]  Electronically Signed    PCN/MedQ  DD: 06/18/2008  DT: 06/18/2008  Job #: [id number]

## 2011-02-22 NOTE — Assessment & Plan Note (Signed)
[name]                            CARDIOLOGY OFFICE NOTE   [name], [name]                         MRN:          [id number]  DATE:12/21/2007                            DOB:          06/23/1935    [name] returns today followup.  She has had a history of Takasubo disease  with anterior wall MI.  She has had a nice recovery of her LV function.  She has significant anxiety disorder.  Her memory is somewhat off. She  needs see Dr. Jacky Michael.  She has been on Zoloft and Xanax. Her anxiety  clearly had something to do with her MI.  She has been doing well.  She  denies any significant chest pain.  She has not had any PND, orthopnea.  Her memory is quite poor. She will go to the grocery store and forget  her groceries. She forgets a pocket book at places.   She continues to have depression over her husband's death. Her daughter,  [name], was with her today. We had a nice conversation about some of  her functional abilities. In regards to heart, she has also had sick  sinus syndrome with PAF. Pacemaker seems to be functioning well.  She is  on chronic Coumadin. Her INRs little bit thin today, but there has been  no bleeding diathesis.   REVIEW OF SYSTEMS:  Otherwise negative.   CURRENT MEDICATIONS:  1. Toprol 50 a day.  2. Benazepril 20 a day.  3. Coumadin as directed.  4. Zoloft 50 daily.  5. Glipizide 5/500 b.i.d.  6. Xanax 0.25 b.i.d.  7. Lasix 60 a day.  8. Pravastatin 20 a day.  9. K-Dur 20 a day.   PHYSICAL EXAMINATION:  Exam is remarkable for somewhat anxious white  female with occasional jerkiness.  Weight is 215, blood pressure 140/80,  pulse 80 and regular, afebrile.  HEENT:  Unremarkable.  Carotids are normal without bruit, no  lymphadenopathy, thyromegaly or JVP elevation.  LUNGS:  Clear with good diaphragmatic motion.  No wheezing.  There is an S1-S2 with normal heart sounds. PMI normal.  ABDOMEN: Is benign.  Bowel sounds  positive.  No AAA.  No tenderness, no  hepatosplenomegaly.  No hepatojugular reflux.  Distal pulses are intact. No edema.  NEURO:  Nonfocal.  SKIN:  Warm and dry.  No muscular weakness.   IMPRESSION:  1. Coronary disease, previous anterior myocardial infarction.      Continue aspirin, beta blocker and ACE inhibitor. Good recovery of      left ventricular function by echo.  2. Paroxysmal atrial fibrillation, sick sinus syndrome.  Follow-up      with pacemaker clinic in 6 months.  Continue Coumadin.  3. Anxiety, depression.  Continue Zoloft and Xanax. Follow-up with Dr.      Jacky Michael to see if Aricept or some other medicine may be in order.  4. Diabetes.  Hemoglobin A1c quarterly.  Continue glipizide. No      evidence of hypoglycemic reactions.  5. Hypercholesterolemia.  Continue pravastatin, lipid and liver      profile in 6  months.   Overall, [name] is doing well.     [name]. [name], MD, [facility]  Electronically Signed    PCN/MedQ  DD: 12/21/2007  DT: 12/22/2007  Job #: [id number]   cc:   Kayla Paradise, MD

## 2011-02-25 NOTE — Op Note (Signed)
[name], [name]                          ACCOUNT NO.:  [id number]   MEDICAL RECORD NO.:  [id number]                   PATIENT TYPE:  OIB   LOCATION:  2858                                 FACILITY:  [facility]   PHYSICIAN:  Doylene Canning. Ladona Ridgel, M.D. [facility]           DATE OF BIRTH:  [birth year]   DATE OF PROCEDURE:  11/22/2002  DATE OF DISCHARGE:                                 OPERATIVE REPORT   PROCEDURE PERFORMED:  Implantation of a dual chamber pacemaker.   INDICATIONS FOR PROCEDURE:  Symptomatic bradycardia with high grade AV  Wenckebach block and non-sustained ventricular tachycardia in the absence of  AV  nodal blocking drugs.   I. INTRODUCTION:  The patient is a very pleasant 76 year-old woman with a  history of palpitations and chest pain that have been documented to be  related to her non-sustained ventricular tachycardia.  In addition, she has  intermittent high grade AV block with Wenckebach block as well as 2:1 heart  block.  She is symptomatic with fatigue and weakness with her bradycardia  which results in ventricular rates in the 30's to 40's.  She is now referred  for permanent pacemaker insertion.   II. PROCEDURE:  After informed consent was obtained the patient was taken to  the diagnostic EP laboratory in the fasting state.  After the usual  preparation and draping, intravenous Fentanyl and midazolam were given for  sedation. A total of 30 ml of lidocaine was infiltrated into the left  infraclavicular region.  A 5 cm incision was carried out over this region  and electrocautery was utilized to dissect down to the subpectoralis fascia.  Ten ml of contrast demonstrated a patent left subclavian vein.  It was  subsequently punctured times two and the Medtronic model 5076, 52 cm active  fixation pacing lead, serial #[id number] was advanced into the right  ventricle.  Next, the Medtronic model 5076, 45 cm active fixation pacing  lead, serial #[id number] was advanced into the  right atrium.  Mapping was  carried out in the right ventricle and at the final site into the RV apex  the R wave measured 13 millivolts and the pacing threshold was 0.6 volts at  0.5 milliseconds with a pacing impedance once the lead was actively fixed of  approximately 1200 ohms.  Ten volt pacing at this location did not result in  diaphragmatic stimulation.  With the ventricular lead in satisfactory  position attention was turned to the atrial lead.  It was placed in the  anterolateral region of the right atrium.  At this location the P wave  measured 2.3 millivolts and the atrial threshold was 0.8 volts at 0.5  milliseconds with a pacing impedance of 636 ohms once the lead was actively  fixed.  Again, ten volt pacing did not result in diaphragmatic stimulation.  With both atrial and ventricular leads in satisfactory position, a figure-of-  eight silk  suture was utilized to secure the leads to the fascia.  In  addition, the pacing leads were secured with silk suture. Electrocautery was  utilized to assure hemostasis after it was used to make a subcutaneous  pocket.  Kanamycin irrigation was utilized to irrigate the incision.  The  Medtronic Kappa [id number], serial [id number] was then utilized and connected  to the atrial and ventricular pacing leads and placed in the subcutaneous  pocket.  The generator was secured with a silk suture.  After additional  Kanamycin was utilized to irrigate the pocket the incision was closed with a  layer of 2-0 Vicryl followed by a layer of 3-0 Vicryl followed by a layer of  4-0 Vicryl. Benzoin was painted on the skin, Steri-Strips were applied, a  pressure dressing placed and the patient returned to her room in  satisfactory condition.   III. COMPLICATIONS:  There were no immediate procedure complications.    IV. RESULTS:  This demonstrates successful implantation of a Medtronic dual  chamber pacemaker in a patient with symptomatic bradycardia and  paroxysmal  ventricular tachycardia on no AV nodal blocking drugs.                                               Doylene Canning. Ladona Ridgel, M.D. [facility]    GWT/MEDQ  D:  11/22/2002  T:  11/22/2002  Job:  [id number]   cc:   Geoffry Paradise, M.D.  [address]  [place]  [place] [address]  Fax: [phone]   Charlton Haws, M.D. [facility]   Kathrine Cords, R.N. [facility]

## 2011-02-25 NOTE — Assessment & Plan Note (Signed)
[facility]                              CARDIOLOGY OFFICE NOTE   [name], [name]                         MRN:          [id number]  DATE:06/15/2006                            DOB:          02/24/1935    [name] returns today for followup.  She is status post anterior wall MI due  to __________ disease.  Her LV function is improved, in believe into the 50%  range, by recent echo.   She has severe anxiety disorder which is being treated.  She seems better  today but is still easily startled.  The patient has had PAF.  She is on  sotalol and Coumadin.  Her Coumadin level has been up and down.  We checked  her rhythm today through her pacemaker and she is in sinus rhythm.  I did  not think, therefore, she would be a candidate for Rocket.   She has otherwise been feeling well without significant chest pain, PND or  orthopnea.  She needed a refill on her nitro.   EXAM:  She is being paced at a rate of about 70.  LUNGS:  Clear.  Carotids normal.  Normal S1 and S2.  Normal heart tones.  ABDOMEN:  Benign.  LOWER EXTREMITIES:  Intact pulses, no edema.   IMPRESSION:  1. Stable paroxysmal atrial fibrillation on sotalol and Coumadin.      Continue current therapy.  Pacemaker working appropriately.  2. Previous __________  disease.  3. Anxiety being controlled.   Refill for nitroglycerin given.  Left ventricular function improving.  I  will see her back in six months.                               [name]. [name], MD, [facility]    PCN/MedQ  DD:  06/15/2006  DT:  06/15/2006  Job #:  [id number]

## 2011-02-25 NOTE — Discharge Summary (Signed)
   [name], [name]                          ACCOUNT NO.:  [id number]   MEDICAL RECORD NO.:  [id number]                   PATIENT TYPE:  OIB   LOCATION:  4743                                 FACILITY:  [facility]   PHYSICIAN:  Doylene Canning. Ladona Ridgel, M.D. [facility]           DATE OF BIRTH:  24-Aug-1935   DATE OF ADMISSION:  11/22/2002  DATE OF DISCHARGE:  11/23/2002                           DISCHARGE SUMMARY - REFERRING   PROCEDURES:  Medtronic pacemaker implantation November 22, 2002.   REASON FOR ADMISSION:  Please refer to dictated admission note.   HOSPITAL COURSE:  The patient presented for elective placement of a  Medtronic dual-chamber pacemaker, on February 13, by Doylene Canning. Ladona Ridgel, M.D.  (see operative report for full details), for treatment of symptomatic  bradycardia in the setting of high-grade second-degree type 1 AV block and  history of nonsustained ventricular tachycardia.   The patient underwent placement of the pacemaker, with no noted  complications.  Pacemaker was interrogated on morning of discharge and  reviewed by Dr. Ladona Ridgel.   The patient was cleared for discharge by Dr. Ladona Ridgel with recommendations for  outpatient follow-up wound check in approximately two weeks and follow up in  8-10 weeks.Marland Kitchen   DISCHARGE MEDICATIONS:  1. Lipitor 20 mg daily.  2. Metaglip 5/500 mg b.i.d.  3. Toprol XL 25 mg daily.  4. Lasix 40 mg daily.  5. Coated aspirin 325 mg daily.  6. Celebrex 200 mg daily.  7. Lotensin 20 mg daily.   INSTRUCTIONS:  1. The patient is to follow recommended instructions as outlined in the     pacemaker discharge sheet.  2. The patient is scheduled for a follow-up with the [facility] on     December 05, 2002, at 9:15 a.m.  Arrangements will then be made for     return office visit with Doylene Canning. Ladona Ridgel, M.D. in approximately 8-10     weeks.   DISCHARGE DIAGNOSES:  1. Symptomatic bradycardia with intermittent high-grade arteriovenous     Wenckebach  block.     a. Status post Medtronic dual-chamber pacemaker implantation November 22, 2002.  2. History of nonsustained ventricular tachycardia.  3. Multiple cardiac risk factors.     a. Non-ishemic dobutamine  Cardiolite - December 2003.     b. Normal left ventricular function.  4.     Hypertension.  5. Dyslipidemia.  6. Type 2 diabetes mellitus.     Gene Serpe, P.A. [facility]                      Doylene Canning. Ladona Ridgel, M.D. [facility]    GS/MEDQ  D:  11/23/2002  T:  11/23/2002  Job:  [id number]   cc:   Geoffry Paradise, M.D.  [address]  [place]  [place] [address]  Fax: [phone]

## 2011-02-25 NOTE — Consult Note (Signed)
[name], [name]                ACCOUNT NO.:  [id number]   MEDICAL RECORD NO.:  [id number]          PATIENT TYPE:  INP   LOCATION:  5511                         FACILITY:  [facility]   PHYSICIAN:  Pramod P. Pearlean Brownie, MD    DATE OF BIRTH:  1935/09/30   DATE OF CONSULTATION:  11/11/2004  DATE OF DISCHARGE:                                   CONSULTATION   REFERRING PHYSICIAN:  Charlton Haws, M.D.   REASON FOR CONSULTATION:  Dizziness.   HISTORY OF PRESENT ILLNESS:  [name] is a 76 year old Caucasian lady who  has had longstanding gait and balance difficulties for several years.  She  states she feels dizzy.  By that she means feeling of imbalance,  particularly in the mornings when she gets up and tries to walk.  The  feeling is transient.  She is able to hold on to something and eventually  able to walk.  Every time she tries to walk suddenly or quickly, she feels  off balance.  She has difficulty with her balance, particularly at night  when she has to get up to go to the bathroom.  She denies any pain, tingling  or numbness in her feet.  She denies any focal neurological symptoms in the  form of headache, blurred vision, double vision, vertigo, extremity weakness  or numbness.  She had an unwitnessed fall three years ago in sleep and is  unable to recall what happened and she was found on the floor.  There was no  loss of consciousness, tonic clonic activity, tongue biting or incontinence  noted.  There is no history of seizures, strokes or TIAs.   PAST MEDICAL HISTORY:  Significant for coronary artery disease, diabetes,  congestive heart failure, paroxysmal atrial fibrillation, cataracts.   HOME MEDICATIONS:  1.  Aspirin.  2.  Novolin.  3.  Ambien.  4.  Xanax.  5.  Lipitor.  6.  Coumadin.  7.  Benazepril.  8.  Toprol.  9.  Lasix.  10. Glipizide.   MEDICATIONS/ALLERGIES:  None.   SOCIAL HISTORY:  The patient lives alone in [place].  She does not smoke  or drink or do  drugs.  She is independent in activities of daily living.   FAMILY HISTORY:  Not significant for anybody with stroke or other  neurological problem.   REVIEW OF SYSTEMS:  Not significant for recent fever, cough, chest pain,  shortness of breath, diarrhea.   PHYSICAL EXAMINATION:  GENERAL:  A pleasant, middle aged Caucasian lady who  is not in distress.  VITAL SIGNS:  She is afebrile.  Pulse 88/minute and regular, respiratory 20  per minute.  Orthostatic vital signs were as follows.  In the supine  position, pulse 78, blood pressure 147/64.  In the standing position, pulse  98, blood pressure 110/58.  The patient did have symptoms of dizziness.  NECK:  Supple without bruit.  ENT:  Unremarkable.  CARDIAC:  No murmurs, rubs or gallops.  LUNGS:  Clear to auscultation.  ABDOMEN:  Soft, nontender.  NEUROLOGIC:  The patient is pleasant, awake, alert, cooperative.  There is  no aphasia, apraxia or dysarthria.  Pupils equal, round and reactive to  light and accommodation.  Face is symmetric.  Palate movement is normal.  Tongue is midline.  Motor system exam--there was no upper extremity drift.  Symmetric upper and lower extremity strength.  Flexors are symmetrical.  Plantars are downgoing.  There is no subjective sensory loss to touch and  pinprick.  Position sense is impaired over toes bilaterally.  Vibration  sense appears preserved.  There is no finger-to-nose ____________.  She gets  up slowly and walks with a slightly unsteady gait.  She is unsteady  ____________ with eyes opened, ____________ with eyes closed.  She tends to  fall if not caught while testing Romberg. She is unable to walk tandem.  She  is unsteady with walking while unsupported.   LABORATORY DATA:  Noncontrasted CAT scan done yesterday showed mild atrophy,  no acute infarction, hemorrhage is noted.  Admission labs unremarkable.   IMPRESSION:  76 year old lady with longstanding gait ataxia, likely due to  combination  of diabetic sensory peripheral neuropathy as well as mild  orthostatic hypotension from her multiple blood pressure lowering  medication.   PLAN:  I would recommend orthostatic tolerance exercises prior to getting up  every day.  Will refer to physical therapy for gait and balance training.  Check vitamin B12, TSH, levels.  I do not believe any further testing is  necessary from a neurological standpoint.  She may electively follow up as  an outpatient if needed.  Thank you for the referral.      PPS/MEDQ  D:  11/11/2004  T:  11/11/2004  Job:  [id number]   cc:   Charlton Haws, M.D.

## 2011-02-25 NOTE — Discharge Summary (Signed)
[name], [name]                ACCOUNT NO.:  [id number]   MEDICAL RECORD NO.:  [id number]          PATIENT TYPE:  INP   LOCATION:  5511                         FACILITY:  [facility]   PHYSICIAN:  Kayla Pod, NP    DATE OF BIRTH:  12-07-1934   DATE OF ADMISSION:  11/05/2004  DATE OF DISCHARGE:  11/12/2004                                 DISCHARGE SUMMARY   PRIMARY CARDIOLOGIST:  [name], M.D.   PRIMARY CARE PHYSICIAN:  [name], M.D.   EP:  [name], M.D.   DISCHARGE DIAGNOSES:  1.  Takotsubo cardiomyopathy with EF of 30% by echocardiogram.  2.  Congestive heart failure.  3.  Elevated cardiac enzymes, troponin peaked at 2.42.   PAST MEDICAL HISTORY:  1.  Remote history of cardiac catheterization which the patient says was      okay.  2.  Diabetes.  3.  Hypertension.  4.  Family history of coronary artery disease.  5.  History of congestive heart failure.  6.  History of GERD.  7.  Anticoagulation therapy with Coumadin.  8.  Paroxysmal atrial fibrillation.  9.  Status post Medtronic permanent pacemaker secondary to symptomatic      bradycardia and tachybrady syndrome.  10. Status post dobutamine and Cardiolite in December 2003 which was      negative for ischemia and EF at that time was 68%.  11. Osteoarthritis.  12. Status post C-spine surgery in the past.   HOSPITAL COURSE:  This is a very pleasant 76 year old Caucasian female with  remote history of congestive heart failure who presented to [facility] with  complaints of shortness of breath and chest pain on the day of admission.  In the emergency room, she received 80 mg of IV Lasix, and her symptoms  improved somewhat.  However, the patient had episodes of chest pain which  had started actually six days prior to admission with chest pain in her left  chest area that was brief and sharp.  A 2D echocardiogram was performed on  November 05, 2004, at which time the patient was found to have a left  ventricular ejection fraction of 25-30% with dyskinesis of the apex,  akinesis of the mid to distal posterior lateral and anterior septal walls  which drastically decreased from previous echocardiogram.  The patient was  admitted under the care of Dr. Jens Michael, baseline blood work obtained  including B-met, PT, PTT, CBC, TSH, hemoglobin A1C and a sputum culture.  We  continued her medications from home including her Benazepril, Toprol,  Coumadin.  Coumadin was on hold.  Lipitor given.  The patient was placed on  Rocephin, nitroglycerin IV and Lasix IV.  The patient's breathing continued  to improve.  However, cardiac enzymes positive.  It was decided the patient  would need a cardiac catheterization.  On the evening of January 29, p.m.  rounds, the patient complained of increased anxiety with some transient  confusion.  The patient fell, pulled her IV's out.  Head CT was obtained  which showed cerebral atrophy, no acute intracranial abnormalities.  Also,  spinal x-rays showing previous anterior cervical fusion at C3-C6 levels.  No  evidence for fracture or subluxation.  On January 30, the patient's  confusion had decreased.  No further complications.  Ativan was  discontinued.  Patient to the catheterization lab on January 30.  Results as  following.  Left main coronary artery free of significant disease.  LAD had  a 70% ostial stenosis in the first diagonal branch.  Circumflex artery free  of disease.  Right coronary artery free of significant disease.  Left  ventriculogram showed anterior wall near the base moved vigorously, inferior  wall near the base moved vigorously.  This had the feature of Takotsubo  cardiomyopathy.  Plans to continue medical therapy.  Cardiac rehabilitation  initiated.  Up with patient for ambulation.  Patient unsteady gait.  Neurological consult by [name] on October 11, 2004, to evaluate long  standing gait and balance difficulties for several years with  dizziness.  His recommendation was orthostatic tolerance exercises prior to getting up  every day, physical therapy for gait and balance training, check a B-12 and  TSH level.  He did not feel any further neurological testing was necessary.  She may electively follow up as outpatient if needed.  On February 3, Dr.  Eden Michael in to see the patient without complaints of any discomfort.   PLAN:  Discharge patient home.  Arrangements have been made through case  management for home hospital bed.  The patient has a walker that has been  okay for home use.  [facility] has been set up.   DISCHARGE MEDICATIONS:  1.  Lasix 40 mg daily.  2.  Benazepril 30 mg daily, take one and a half tablets of her 20 mg      tablets.  3.  Toprol XL has been increased to 75 mg daily, previously 50.  4.  Coumadin 4 mg daily.  5.  Lipitor 40 mg daily.  6.  Pain management:  Tylenol for general discomfort.   DISCHARGE INSTRUCTIONS:  1.  Activity:  As tolerated.  No lifting over 10 pounds x1 week.  2.  Diet:  As previously, adding low salt.  3.  Wound care:  Gently clean catheterization site with soap and water.  No      tub bathing x1 week.   FOLLOWUP:  The patient has an appointment with the Coumadin clinic for  Monday, February 6 at 2 p.m.  She has a follow up appointment with Dr.  Eden Michael February 20 at 11:30 at which time the patient will be reevaluated  and will probably need an echocardiogram repeated.  [name], March 15, at  12:10 for pacer check.  The patient will arrange follow up appointment with  her primary care physician next week to follow diabetes medications.      MB/MEDQ  D:  11/12/2004  T:  11/12/2004  Job:  [id number]   cc:   [name], M.D.   [name], M.D.  [address]  [place]  [place] [address]  Fax: [phone]   [name], M.D.

## 2011-02-25 NOTE — H&P (Signed)
[name], [name] NO.:  [id number]   MEDICAL RECORD NO.:  [id number]          PATIENT TYPE:  EMS   LOCATION:  MAJO                         FACILITY:  [facility]   PHYSICIAN:  Olga Millers, M.D. LHCDATE OF BIRTH:  [birth year]   DATE OF ADMISSION:  11/05/2004  DATE OF DISCHARGE:                                HISTORY & PHYSICAL   MEDICAL RECORD NUMBERS:  [id number], [id number], [id number]   PRIMARY CARE PHYSICIAN:  Geoffry Paradise, M.D.   PRIMARY CARDIOLOGIST:  Charlton Haws, M.D.   CHIEF COMPLAINT:  Shortness of breath, chest pain.   HISTORY OF PRESENT ILLNESS:  [name] is a 76 year old female with history  of negative Cardiolite in 2003.  She had a cardiac catheterization sometime  in the 1990's by Dr. Swaziland and has a remote history of congestive heart  failure.  Approximately 6 days ago, she had 10/10 chest pain in her left  chest.  It was brief and sharp.  Since then, she has had approximately five  similar episodes but not as severe, and they last only seconds.  Also, for  about a week she has had increasing dyspnea on exertion.  She denies  increasing edema and does not weigh herself daily.  She states that last  p.m. the symptoms became much worse and she was unable to sleep.  She was up  all night long and came to the emergency room.  In the emergency room, she  received 80 mg of IV Lasix.  She had greater than 1 liter of urine output  and her symptoms have improved somewhat.  She had chest pain today.  She has  been coughing a lot over the last few days, but the chest pain does not  change with cough.  She has had blood tinged sputum today only.  Up until a  week ago she was in her usual state of health, and in fact, has been doing  better recently because she has been more active [facility].  She is followed by Dr. Charlton Haws and by the Coumadin  clinic.  She denies palpitations, although, she had some a week ago with the  first episode of chest pain.   PAST MEDICAL HISTORY:  1.  Remote history of cardiac catheterization which the patient says was      okay.  2.  Diabetes mellitus.  3.  Hypertension.  4.  Family history of coronary artery disease.  5.  History of congestive heart failure.  6.  History of gastroesophageal reflux disease symptoms and possibly a      remote history of peptic ulcer disease.  7.  Anticoagulation with Coumadin.  8.  Paroxysmal atrial fibrillation.  9.  Status post Medtronic permanent pacemaker secondary to symptomatic      bradycardia and tachybrady syndrome.  10. Status post dobutamine and Cardiolite in December 2003 which was      negative for ischemia and EF of 68%.  11. Osteoarthritis.   PAST SURGICAL HISTORY:  C-spine surgery and permanent pacemaker.   ALLERGIES:  No  known drug allergies.   MEDICATIONS:  1.  Glipizide/metformin 5/500 b.i.d.  2.  Lasix 40 mg daily.  3.  Benazepril 20 mg daily.  4.  Toprol XL 50 mg daily.  5.  Coumadin 4 mg daily.  6.  Lipitor 40 mg daily.   SOCIAL HISTORY:  She lives in [place] and is alone.  She is a widow.  She  is retired.  She does not abuse alcohol, tobacco or drugs.  [facility]  She has two daughters that live nearby and  are very attentive.   FAMILY HISTORY:  Her parents were both in their 12's when they died and did  not have heart disease, but she has a brother who is alive and has a history  of coronary artery disease.   REVIEW OF SYSTEMS:  Significant for history of vision problems secondary to  cataracts.  She has chronic arthralgias.  She has chest pain, shortness of  breath as described above.  She has a recent productive cough.  She denies  any reflux symptoms, although, she had diarrhea last night.   REVIEW OF SYSTEMS:  Otherwise, negative.   PHYSICAL EXAMINATION:  VITAL SIGNS:  Temperature 99.9, blood pressure  174/86, heart rate 122, respiratory rate 28, O2 saturation  89% on 2 liters  (95% on NRV).  GENERAL APPEARANCE:  She is a well-developed, obese, elderly white female  who is actively short of breath.  HEENT:  Normocephalic, atraumatic.  Pupils equal, round and reactive to  light and accommodation.  Extraocular movements intact.  Sclerae are clear.  Nares without discharge.  NECK:  A little stiff.  There is no lymphadenopathy or thyromegaly or bruits  noted.  There is JVD at 8 cm.  CV:  Her heart is regular and rate and rhythm with an S1, S2, but no  significant murmurs, rubs or gallops is noted.  Distal pulses are 2+ and no  femoral bruits are appreciated.  LUNGS:  She has decreased breath sounds at the bases and few crackles, right  greater than left.  SKIN:  No rashes or lesions are noted.  ABDOMEN:  Soft, nontender with active bowel sounds.  EXTREMITIES:  No cyanosis, clubbing or edema.  MUSCULOSKELETAL:  No joint deformity, effusions and no spine or CVA  tenderness noted.  NEUROLOGICAL:  She is alert and oriented and cranial nerves II-XII grossly  intact.   STUDIES:  EKG is sinus tachycardia with V pacing.  Rate is 125.  It is  different when compared to EKG's of 2001 because of the V pacing.   LABORATORY DATA:  Hemoglobin 13.1, hematocrit 39.9, WBC 23.8, platelets 457.  INR 2.9.  Sodium 141, potassium 4.6, chloride 108, CO2 19, BUN 10,  creatinine 0.8, glucose 212.  Other C-met values within normal limits.  BNP  447.   Chest x-ray:  Mild cardiomegaly with bilateral airspace disease, right  greater than left.   ASSESSMENT/PLAN:  1.  Chest pain.  Will add MI.  Check echocardiogram.  Probably need      catheterization.  2.  Congestive heart failure.  IV Lasix 80 mg twice today, then once      tomorrow then daily.  Check daily weights.  Follow Eyes and Nose.  3.  Diabetes.  Her CBG was greater than 400 upon arrival to the emergency     room, but she received IV Lasix.  The Glucophage will be discontinued,      and  she will be  continued on the glipizide portion of the combo drug.      Will also use sliding scale and check a hemoglobin A1C.  4.  Hyperlipidemia.  Check in a.m.  5.  Leukocytosis.  Recheck a CBC in a.m. as this may be secondary to      congestive heart failure.  Her temperature is not greater than 100, and      she had no productive cough until today, and the sputum today is blood      tinged but does not appear purulent.  Will recheck a CBC in 48-72 hours.      Follow the white count, and if she becomes febrile or her white count      increases further, will start antibiotics.  The patient is otherwise      stable and will be continued on her home medications.      RB/MEDQ  D:  11/05/2004  T:  11/05/2004  Job:  [id number]

## 2011-02-25 NOTE — Assessment & Plan Note (Signed)
[facility]                            CARDIOLOGY OFFICE NOTE   Michael, Kayla                         MRN:          [id number]  DATE:12/27/2006                            DOB:          Nov 15, 1934    [name] returns today for followup.  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  She did not have any  epicardial disease.  Her LV function has improved.  She continues to  have atypical chest pain.  She does not want to have a treadmill test.  She gets very agitated when you talk about this.   The patient has a pacemaker in for sick sinus syndrome.  She has tried 3  different telephone-type monitoring systems and cannot seem to negotiate  either of them.  We will have her come back to the clinic on a quarterly  basis to have her pacemaker checked.  She is due to come back to see the  Coumadin clinic March 31 and we see if [name] can interrogate her  Medtronic Kappa device at that time.  She has been doing fairly well.  She has occasional atypical chest pain.  She denies any significant PND  or orthopnea.  She has not had significant shortness of breath.  She has  been compliant with her medications.  Her daughters have noticed that  her memory has been poor and they were asking if the Lipitor could cause  this.  I told them that there are case reports of this and that we would  be happy to switch her to a different statin drug.  Otherwise she seems  to be doing fairly well.   CURRENT MEDICATIONS:  1. Toprol 50 a day.  2. Benazepril 20 a day.  3. Coumadin as directed.  4. Lipitor 40 a day.  5. Zoloft.  6. Glipizide 5/500.  7. Xanax 0.25 b.i.d.  8. K-Dur 20 a day.  9. Lasix 60 a day.   PHYSICAL EXAMINATION:  The blood pressure is 150/80, pulse is 85 with  pacing.  HEENT:  Normal.  NECK:  Carotids are normal without bruit.  LUNGS:  Clear.  CARDIAC:  There is an S1, S2, normal heart sounds.  ABDOMEN:   Benign.  LOWER EXTREMITIES:  Intact pulses, no edema.   EKG shows AV pacing at a rate of 70 which is probably her lower rate.   IMPRESSION:  Stable Takayasu's disease with previous anterior wall MI,  improved LV function,when last checked about 45%, continue ACE inhibitor  and diuretics.  The patient's chest pain is atypical.  I do not think  she needs a functional study at this time.  Her last Myoview done in  January of 2007 showed a small apical infarct with no ischemia.  She  will switch from Lipitor to Pravachol to see if her memory improves.   She will come back on March 31 to get her Coumadin checked and to see  the pacemaker clinic.   Overall, I think she is doing well and I will see her  in 6 months.     [name]. Kayla Emms, MD, [facility]  Electronically Signed    PCN/MedQ  DD: 12/27/2006  DT: 12/27/2006  Job #: [id number]

## 2011-02-25 NOTE — Op Note (Signed)
[place]  Patient:    [name], [name]                         MRN: [id number] Proc. Date: 02/10/00 Adm. Date:  [id number] Disc. Date: [id number] Attending:  Jonne Ply Dictator:   Stefani Dama, M.D.                           Operative Report  PREOPERATIVE DIAGNOSIS:  Cervical spondylosis with myelopathy and radiculopathy on the left.  POSTOPERATIVE DIAGNOSIS:  Cervical spondylosis with myelopathy and radiculopathy on the                               left.  PROCEDURE: 1. Anterior cervical diskectomy and arthrodesis of C3-4, C4-5 and    C5-6. 2. Fixation with Synthes hardware. 3. Structural allograft.  ANESTHESIA:  General endotracheal.  INDICATIONS:  The patient is a 76 year old individual who has had significant neck, shoulder and left arm pain for two months time.  She has marked spondylitic disease on cervical MRI.  PROCEDURE:  The patient was brought to the operating room and placed on the table in supine position, after smooth induction of general endotracheal anesthesia and placement of a Foley catheter.  She was placed in five pounds of halter traction.  Neck was prepped with DuraPrep and draped in a sterile fashion.  A transverse incision was made in the neck and carried down through the platysma.  The plane between the sternocleidomastoid and the strab muscles was dissected bluntly until the first prevertebral space was identified.  This was noted to be C3-4 on radiograph.  Dissection was then carried down inferiorly to expose C4-5 and C5-6.  Self-retaining Kaspar retractor was placed into the wound, and a C3-4 diskectomy was performed removing some ventral osteophyte material from the inferior lip of the body of C3.  The posterior longitudinal ligament was identified ultimately, and this was noted to be bowed back dorsally significantly.  There was some subligamentous disk material present that was resected.  Then  using a Midas-Rex and a two-burr, the inferior margin of the posterior body of C3 was removed.  Dissection was carried out laterally into the uncinate processes such that each of these was removed.  The area was completely decompressed in this fashion.  The area was then checked for hemostasis, and a 6 mm round fibular graft was packed with some of the remnants of bone that was harvested from the uncinate processes. Graft was inserted and attention was then turned to C4-5.  Here a similar procedure was carried out again doing a ventral decompression with removal of disk and osteophyte and then opening up the posterior longitudinal ligament after removing fairly prominent osteophyte from the inferior margin of the body of C4.  On the left lateral region then behind the posterior longitudinal ligament was identified several moderate-sized free fragments of disk that were compromising the path of the C5 nerve root.  These were removed. Bleeding from the epidural veins ensued.  This was contained with a bipolar cautery and some small pledgets of Gelfoam soaked in thrombin which were later removed.  In the end, both uncinate processes were decompressed and again a 6 mm round fibular graft was placed in the interspace.  At C5-6, the spondylitic ridging was identified.  No disk herniation was found.  Again,  around 6 mm fibular graft was placed into the interspace once decompressed. Hemostasis was obtained from the soft tissues, and a 54 mm Synthes plate was affixed to the ventral aspect of the vertebral bodies and locked into place with eight locking 4 x 14 mm self-drilling, self-tapping screws.  Placement of the constrict was checked radiographically.  The area was then irrigated copiously with antibiotic-irrigating solution.  The platysma was closed with 2-0 Vicryl in interrupted fashion.  3-0 Vicryl was used subcuticularly. Steri-Strips were placed on the skin.  The patient tolerated the  procedure well and was returned to the recovery room in stable condition. DD:  02/10/00 TD:  02/14/00 Job: [id number]

## 2011-02-25 NOTE — Discharge Summary (Signed)
[name], [name]                ACCOUNT NO.:  [id number]   MEDICAL RECORD NO.:  [id number]          PATIENT TYPE:  INP   LOCATION:  2001                         FACILITY:  [facility]   PHYSICIAN:  Rollene Rotunda, M.D.   DATE OF BIRTH:  1935-02-28   DATE OF ADMISSION:  10/05/2005  DATE OF DISCHARGE:  10/06/2005                                 DISCHARGE SUMMARY   PROCEDURES:  Two-dimensional echocardiogram.   PRIMARY DISCHARGE DIAGNOSES:  Chest pain, cardiac enzymes were negative for  myocardial infarction and outpatient Myoview scheduled.   SECONDARY DIAGNOSES:  1.  Moderate coronary artery disease with cardiac catheterization in January      2006 showing a 70% first diagonal, no significant disease in the      circumflex nor right coronary artery.  2.  History of myocardial infarction with takotsubo physiology and an      ejection fraction of 30% in January of 2006.  3.  Status post adenosine Myoview in April 2006 showing a periapical defect      with minimal peri-infarct ischemia and an ejection fraction of 75%.  4.  Diabetes.  5.  Hypertension.  6.  Family history of coronary artery disease.  7.  Dyslipidemia with total cholesterol of 151, triglycerides 119, HDL 43,      LDL 84 on Lipitor 40.  8.  History of congestive heart failure.  9.  History of Medtronic permanent pacemaker secondary to symptomatic      bradycardia/tachybrady syndrome.  10. History of paroxysmal atrial fibrillation.  11. Anticoagulation with Coumadin secondary to paroxysmal atrial      fibrillation.  12. Gastroesophageal reflux disease symptoms.  13. Osteoarthritis.  14. History of cervical spine surgery.   ALLERGIES:  No known drug allergies.   PRIMARY CARE PHYSICIANS:  Primary care physician is Geoffry Paradise, M.D.,  primary cardiologist is Charlton Haws, M.D.   HOSPITAL COURSE:  [name] is a 76 year old female with a history of  noncritical coronary artery disease by catheterization in 2006.   She had two  episodes of flushed and sweating feeling that lasted five to ten minutes  last week.  She has a two-week history of episodic left flank pain that  radiates around to her back.  On October 03, 2005 she developed cramps in  her left lower extremity that started at her toes and went all the way up  her leg and also had some left arm pain.  She had multiple episodes of this  and she was sleep deprived secondary to them.  She also describes fleeting  episode of left chest pain that are a pressure and last only a few seconds.  There is no association with exertion.  She gets some with exertion, without  exertion and with stress.  On the day of admission her daughter came to  visit with her and she was complaining of left chest pain, left arm pain and  shortness of breath.  She also describes a history of increased anxiety  recently secondary to the holiday's.  She came to the emergency room and was  admitted  for further evaluation.   Her cardiac enzymes were negative for myocardial infarction.  Her INR was  therapeutic on admission at 2.3 and slightly subtherapeutic on October 06, 2005 at 1.9, but no medication changes were made.  A lipid profile is  described above and a TSH was within normal limits.  She had no further  episodes of chest pain.  An echocardiogram was performed but the results are  pending at the time of dictation.   [name] was evaluated by Dr. Antoine Poche on October 06, 2005. Because her  symptoms had resolved, and she had no calf tenderness or swelling and 2+  distal pulses, he felt that no further evaluation was indicated as an  inpatient.  Her potassium was low normal at 3.8 and she received potassium  supplementation.  Of note, she had recently taken Lasix 40 mg twice for  lower extremity edema which had resolved.  The cramps in her legs started  the day after she did this.  [name] was evaluated by Dr. Antoine Poche and  considered stable for discharge  with outpatient followup arranged.   DISCHARGE INSTRUCTIONS:  1.  Her activity level is to be increased slowly.  2.  She is to stick to a diabetic diet that is low in fat and salt.  3.  She is to follow up with Dr. [name] on November 07, 2005 at 3:30 and she      is to get a stress test on October 14, 2005 at 8:45.   DISCHARGE MEDICATIONS:  1.  K-Dur 20 mEq, take with Lasix p.r.n.  2.  Coumadin 4 mg tablets daily except for 2 mg on Tuesday and Thursday.  3.  Metaglip 5/500 mg t.i.d. a.c.  4.  Lasix 40 mg daily p.r.n.  5.  Toprol-XL 50 mg a day.  6.  Lipitor 40 mg a day.  7.  Benazepril 20 mg a day.  8.  Xanax 0.25 mg increased from b.i.d. to t.i.d. and follow up with primary      M.D.  9.  Zoloft 25 mg p.o. b.i.d.      Theodore Demark, P.A. [facility]    ______________________________  Rollene Rotunda, M.D.    RB/MEDQ  D:  10/06/2005  T:  10/06/2005  Job:  [id number]   cc:   Geoffry Paradise, M.D.  Fax: [phone]   Charlton Haws, M.D.  [address]  [address]  [place]  [place] [address]

## 2011-02-25 NOTE — Consult Note (Signed)
[place]  Patient:    [name], [name]                         MRN: [id number] Proc. Date: 02/12/00 Adm. Date:  [id number] Attending:  Jonne Ply CC:         Stefani Dama, M.D.             Peter M. Swaziland, M.D.                          Consultation Report  REASON FOR CONSULTATION:  First degree A-V block.  CONCLUSIONS:  First degree arteriovenous block probably secondary to beta blocker therapy.  The patient is asymptomatic.  RECOMMENDATIONS: 1. Decrease Toprol XL to 25 mg per day. 2. Follow up with primary care physician, Dr. Swaziland, for blood pressure    followup and further adjustments in medication.  COMMENTS:  The patient is 76 years of age and was status post cervical disk surgery.  Her admitted EKG demonstrated first degree A-V block in the 250 msec range.  Concern was voiced by Dr. Danielle Dess about the first degree A-V block. The patient is asymptomatic.  She is on beta blocker therapy for high blood pressure.  She has a history of recurrent chest pain and underwent cardiac catheterization in 1998 by Dr. Swaziland that did not reveal any significant coronary disease.  PHYSICAL EXAMINATION:  GENERAL:  The patient is asymptomatic.  VITAL SIGNS:  An S4 gallop on auscultation. Blood pressure is stable at 130/70.  CARDIAC:  Reveals an S4, no murmur.  LUNGS:  Clear.  EXTREMITIES:  No edema.  Pulses are 2+ and symmetric.  LABORATORY DATA:  EKGs reveal first degree A-V block with no other acute changes.  There is a suggestion of Wenckebach on the EKG done on Feb 10, 2000, but the patient was in sinus tachycardia at that time, and is not perfectly obvious that Wenckebach was present. DD:  02/12/00 TD:  02/12/00 Job: [id number]

## 2011-02-25 NOTE — Discharge Summary (Signed)
[place]  Patient:    TANIA, STEINHAUSER                         MRN: [id number] Adm. Date:  [id number] Disc. Date: [id number] Attending:  Jonne Ply                           Discharge Summary  REASON FOR ADMISSION:  Cervical spondylosis and cervical spondylitic myelopathy C3-4, C4-5, and C5-6.  FINAL DIAGNOSES:  Cervical spondylosis and cervical spondylitic myelopathy C3-4, C4-5, and C5-6.  ADDITIONAL DIAGNOSIS:  First degree AV block.  HISTORY OF PRESENT ILLNESS:  Sherrell Weir is a 76 year old woman with cervical spondylitic myelopathy who was admitted to the hospital on same-day-as-procedure basis for anterior cervical diskectomy and fusion C3-4, C4-5, and C5-6 levels. She had neck pain, pain in the left shoulder, and weakness in her arm. She had disk herniations at the C4-5 level with spinal canal reduced to 7 mm, particularly on the left side. She also had marked weakness in her deltoid and her biceps.  HOSPITAL COURSE:  She was admitted to the hospital on same-day-as-procedure basis and underwent uncomplicated anterior cervical diskectomy and fusion at the C3-4, C4-5, and C5-6 levels. Postoperatively, she was noted to be in first degree AV block. She was observed in the intensive care unit and had a repeat EKG which again showed first degree AV block. She was seen by a cardiologist who felt that her block was secondary to beta blocker affect, and he reduced her Toprol from 50 mg a day to 25 a day. She was doing well, asymptomatic, not bradycardic on the 5th and was doing very well from the neurologic standpoint with full strength in her upper extremities. Her incision appeared to be well opposed and healing nicely.  DISCHARGE MEDICATIONS:  She is discharged home with Darvocet one to two every four to six hours as needed for pain. Additionally, with her preoperative medications of Amaryl for type 2 diabetes, along with Altace, Zocor,  Lasix, Toprol reduced from 50 to 25 mg a day.  FOLLOW-UP:  She will follow up with Stefani Dama, M.D. in two weeks.  INSTRUCTIONS:  She is instructed not to engage in any lifting, bending, or twisting, pulling with her arms, to wear soft collar, no driving, take it easy at home.DD:  02/12/00 TD:  02/14/00 Job: [id number]

## 2011-02-25 NOTE — Discharge Summary (Signed)
[place]. [facility]  Patient:    [name], [name]                         MRN: [id number] Adm. Date:  [id number] Disc. Date: 02/12/00 Attending:  Jonne Ply                           Discharge Summary  ADMITTING DIAGNOSIS:  Cervical spondylosis with myelopathy C3-4, C4-5, C5-6.  DISCHARGE DIAGNOSIS:  Cervical spondylosis with myelopathy C3-4, C4-5, C5-6.  CONDITION ON DISCHARGE:  Improving.  HOSPITAL COURSE:  The patient is a 76 year old individual who has had significant neck, shoulder, and left arm pain secondary to spondylitic disease at C3-4, 4-5, and 5-6.  The patient was advised regarding surgical decompression, which was performed on Feb 10, 2000.  She tolerated the operation well.  However, she has had some minor difficulties with mobilization and difficulties with swallowing.  Her incision has remained clean and dry.  She is ambulatory at the current time.  She is discharged home with a prescription for Darvocet-N 100 #40 without refills and Xanax as needed for pain and muscle spasms.  She will be seen in the office in three weeks time.  Her diabetes has maintained fair control with some initial subcutaneous injections for control of her sugars. DD:  02/11/00 TD:  02/13/00 Job: [id number]

## 2011-02-25 NOTE — H&P (Signed)
[place]. [facility]  Patient:    [name], [name]                         MRN: [id number] Adm. Date:  02/10/00 Attending:  Stefani Dama, M.D.                         History and Physical  ADMISSION DIAGNOSIS:  Cervical spondylosis with myelopathy and left cervical radiculopathy.  HISTORY OF PRESENT ILLNESS:  The patient is a 76 year old right-handed individual who has had neck pain for six to eight weeks time. Pain started in the region of the left shoulder and arm; and in the past, would limit itself to a few days. For the past two months, however, she had fairly incessant pain; and it has caused her to feel weak in the arm such that she could not lift her arm above her shoulder. She has been treated by Lunette Stands, M.D. who worked her up with an MRI of the cervical spine and referred the patient after it showed significant spondylitic disease at C3-4, C4-5, and C5-6 with evidence of cord compression at each level. There is also narrowing of the spinal canal to 7 mm with a large eccentric disk herniation at C4-5 on the left hand side. The patients films were reviewed in the office; and when it was noted that she had marked weakness in the region of the deltoid also in the region of the biceps, she was advised regarding surgery. She is now admitted for a three-level anterior diskectomy and arthrodesis.  PAST MEDICAL HISTORY:  Reveals that her general health has been fair, save for hypertension and diabetes. She sees Richard A. Jacky Kindle, M.D. as a primary care physician.  CURRENT MEDICATIONS: 1. Premarin 0.625 a day. 2. Lipitor 10 mg daily. 3. Lotensin 10 mg. 4. Amaryl 4 mg. 5. Toprol 50 mg. 6. Lasix 40 mg a day. 7. She also uses Ecotrin 35 mg daily.  SOCIAL HISTORY:  Reveals the patient is married. She lives independently. She is retired.  FAMILY HISTORY:  Reveals that both of her parents are deceased. There is a history of cancer of the stomach  in the fathers side of the family.  REVIEW OF SYSTEMS:  Notable for wearing of glasses and cataracts, high blood pressure, high cholesterol, swelling in the feet and hands, leg pain while walking, weakness in the legs, weakness in the arms, joint pain and swelling, pain in the neck. There is also a history of diabetes.  PHYSICAL EXAMINATION:  GENERAL:  She is an alert, oriented individual in no acute distress.  VITAL SIGNS:  Blood pressure on initial evaluation is 156/88, heart rate 72 and regular, respirations 16 and unlabored.  NECK:  There is significant evidence of tenderness to palpation on the left side of her neck, shoulder, and in the supraclavicular fossa on the left side. There is fullness in the left jawline. There is no tenderness in the region of the carotids. No bruits are heard in the neck. Her range of motion reveals she can turn 60 degrees to the left and 80 degrees to the right. Her extension causes severe exacerbation of pain. Flexion is possible to only 15 degrees.  NEUROLOGICAL:  Motor strength in the upper extremities reveals the deltoids trace strength on the left side, there is antigravity strength at best with the accessory muscles; bicep strength is 4/5, grip strength is 4/5; intrinsic strength  appears normal; tricep strength is 4+/5 on the left. The right side strength appears normal. Tone and bulk are normal. Sensation is intact to pin, light touch, and vibration and the distal upper extremities are intact. The patients gait is normal. Deep tendon reflexes are 2+ in both patellae, 1+ in the Achilles. Babinskis are equivocal bilaterally. Cranial nerves examination reveals the pupils are 4 mm, briskly reactive to light and accommodation. The extraocular movements are full. The face is symmetric to grimace. Tongue and uvula are in the midline. Sclerae and conjunctivae are clear.  LUNGS:  Clear to auscultation.  HEART:  Regular rate and rhythm. No murmurs are  heard.  ABDOMEN:  Soft. Protuberant. Bowel sounds are positive. No organomegaly is noted.  EXTREMITIES:  No clubbing, cyanosis, or edema.  IMPRESSION:  The patient has evidence of spondylitic myelopathy with weakness involving the left upper extremity. She has evidence of marked spondylosis on MRI with cord compression at C3-4, C4-5, and C5-6. She is now being admitted to undergo surgical decompression of the levels involved. DD:  02/10/00 TD:  02/10/00 Job: [id number]

## 2011-02-25 NOTE — H&P (Signed)
[name], [name]                ACCOUNT NO.:  [id number]   MEDICAL RECORD NO.:  [id number]          PATIENT TYPE:  EMS   LOCATION:  [facility]JO                         FACILITY:  [facility]   PHYSICIAN:  Thomas C. Wall, M.D.   DATE OF BIRTH:  [birth year]   DATE OF ADMISSION:  10/05/2005  DATE OF DISCHARGE:                                HISTORY & PHYSICAL   PRIMARY CARE PHYSICIAN:  Dr. Geoffry Paradise.   CARDIOLOGIST:  Dr. Charlton Haws.   CHIEF COMPLAINT:  Chest pain   HISTORY OF PRESENT ILLNESS:  [name] is a 76 year old female with a  history of moderate coronary artery disease by catheterization in January of  2006. In the past couple of weeks she has had left abdominal pain that  radiates around to her back at times as well as two episodes of a flushed  feeling associated with diaphoresis that lasted about 5 to 10 minutes. She  developed cramps in her left lower extremity that went from her toes all the  way up her left leg and also into her left arm. These started on October 03, 2005. She has had multiple episodes which kept her from sleeping. She  also had chest pain that she describes as being above her left breast and is  a pressure but is fleeting. She has had multiple episodes of this that are  very brief. There is no association with exertion because she gets them at  rest, with exertion and with stress. Today her daughter came to see her and  she was having left chest pain, left arm pain and some shortness of breath.  She is here in the emergency room and is currently pain free. [name]  does report increased stress recently secondary to the holidays.   PAST MEDICAL HISTORY:  1.  Status post cardiac catheterization in January of 2006 with an ostial      70% diagonal lesion. No significant disease in the circumflex, RCA or      LAD and an ejection fraction of 30% with takatsubo physiology.  2.  Status post Adenosine Myoview in April of 2006 with a periapical defect  and minimal peri-infarct ischemia, and an ejection fraction of 75%.  3.  Diabetes.  4.  Hypertension.  5.  Family history of coronary artery disease.  6.  History of bradycardia as well as nonsustained VT, status post Medtronic      permanent pacemaker.  7.  History of paroxysmal atrial fibrillation.  8.  Anticoagulation with Coumadin.  9.  History of congestive heart failure.  10. History of iron-deficiency anemia.  11. History of gastroesophageal reflux disease.  12. Osteoarthritis.   PAST SURGICAL HISTORY:  She is status post cardiac catheterization,  pacemaker, and C-spine surgery.   ALLERGIES:  No known drug allergies.   MEDICATIONS:  1.  Metaglip 5/500 one t.i.d. a.c.  2.  Toprol XL 50 mg daily.  3.  Lasix 40 mg daily.  4.  Benazepril 20 mg daily.  5.  Coumadin 4 mg one tablet every day except for 1/2 tablet on Tuesday  and      Thursday.  6.  Lipitor 40 mg daily.  7.  Zoloft 25 mg b.i.d.  8.  Xanax 0.25 mg b.i.d. p.r.n.   SOCIAL HISTORY:  She lives in [place] alone and is a retired Producer, television/film/video for [employer]. She has no history of alcohol, tobacco  or drug use.   FAMILY HISTORY:  Her parents both died in their 12's and neither had heart  disease. Her brother is still alive and has a history of heart disease.   REVIEW OF SYSTEMS:  She denies recent fevers or chills. She has significant  dyspnea on exertion but it has not changed recently. Chest pain and  shortness of breath as described above. She has had recent lower extremity  edema which she treated with an extra Lasix pill and this resolved. She  denies hematemesis, hemoptysis or melena. She denies dysuria. She has  dizziness that both orthostatic and occasionally occurs at other times.  Review of systems is otherwise negative.   PHYSICAL EXAMINATION:  VITAL SIGNS:  Temperature is 98.3, blood pressure  124/58, heart rate 83, respiratory rate 20, O2 saturation 98% on room air.  GENERAL:  She  is a well-developed, obese elderly white female who appears  slightly anxious but in no acute pain.  HEENT:  Head is normocephalic, atraumatic. Pupils are equal, round and  reactive to light and accommodation.  NECK:  There is no JVD, no thyromegaly and no carotid bruits are  appreciated.  CARDIOVASCULAR:  Regular rate and rhythm, S1 and S2 with a soft systolic  ejection murmur noted. There is no significant rub or gallop noted.  CHEST:  Clear to auscultation bilaterally.  SKIN:  No rashes or lesions are noted.  ABDOMEN:  Soft with slight diffuse right and left upper quadrant tenderness  slightly more pronounced on the left. There is no rebound or guarding noted.  Bowel sounds are active, no hepatosplenomegaly by palpation.  EXTREMITIES:  She has 2+ pulses in all four extremities. There is a soft  right femoral bruit appreciated. No clubbing, cyanosis or edema is noted.  MUSCULOSKELETAL:  There is no joint deformity or effusions. No spine or CVA  tenderness.  NEURO:  She alert and oriented with cranial nerves II-XII grossly intact.   Chest x-ray: No acute disease.   EKG is sinus rhythm with ventricular pacing and no acute changes are noted.   LABORATORY DATA:  INR is 2.3, PTT 44. Hemoglobin 12.6, hematocrit 36.5, WBC  11.2 thousand, platelets 460,000. Sodium 132, potassium 3.8, chloride 98,  BUN 28, creatinine 1.1. Point of care markers: Myoglobin elevated at 454, CK-  MB and troponin within normal limits. BNP less than 30.   IMPRESSION:  1.  Chest pain. Her symptoms are atypical and unlike her previous presenting      symptoms for acute coronary syndrome/takatsubo cardiomyopathy in that      she was mainly short of breath at that time. However she had a 70%      diagonal lesion at that time and her EKG is now a paced rhythm. She will      therefore be admitted and cardiac enzymes will be cycled to rule out     myocardial infarction. If her cardiac enzymes are elevated, cardiac       catheterization is indicated. If her cardiac enzymes are negative she      can be discharged to follow up as an outpatient.  2.  Anxiety. Will continue  her home Zoloft but increase the Xanax to t.i.d.      and continue p.r.n. medication as well. She is encouraged to follow up      with her primary care physician as soon as possible.  3.  Leg cramps. We will supplement her potassium to greater than or equal      4.0 and check a magnesium level. With the recent extra Lasix doses for      lower extremity edema, her potassium may have been lower than it is      today at 3.8.   Dr. Valera Castle saw the patient and determined the plan of care.      Theodore Demark, P.A. [facility]      Thomas C. Wall, M.D.  Electronically Signed    RB/MEDQ  D:  10/05/2005  T:  10/05/2005  Job:  [id number]   cc:   Geoffry Paradise, M.D.  Fax: [phone]

## 2011-02-25 NOTE — Assessment & Plan Note (Signed)
[facility]                         ELECTROPHYSIOLOGY OFFICE NOTE   LAVERNE, HURSEY                         MRN:          [id number]  DATE:01/09/2007                            DOB:          1935/02/03    Ms. Dingley returns today for a followup. She is a pleasant, 76 year old  woman with a history of symptomatic bradycardia, atypical chest pain who  was status post pacemaker insertion. She also has longstanding  hypertension. She did not have much in the way of complaints today. In  the past, she has had atypical chest pain but this is not present at the  moment. She has had no syncope, she has rare palpitations.   PHYSICAL EXAMINATION:  GENERAL:  She is a pleasant, well-appearing woman  in no distress.  VITAL SIGNS:  The blood pressure was 150/70, the pulse 87 and regular,  respirations were 18, weight was 205 pounds, down a pound from her visit  with Dr. [name] a couple weeks ago.  NECK:  Revealed no jugular venous distention. There was no thyromegaly.  The carotids were 2+ and symmetric.  LUNGS:  Clear bilaterally to auscultation. There were no wheezes, rales  or rhonchi.  CARDIOVASCULAR:  Regular rate and rhythm with normal S1 and S2.  EXTREMITIES:  Demonstrated no edema.   EKG demonstrates sinus rhythm with AV sequential pacing.   Interrogation of her pacemaker demonstrates a Medtronic Kappa 800 with P  and R waves of 2 and 15, the impedance 476 in the atrium, 851 in the  ventricle, the threshold 1.2 at 0.5 in the atrium, 0.5 at 0.4 in the  right ventricle. Battery voltage 2.77 volts. There were 32 high atrial  rates, the longest of which was 2 hours in duration.   MEDICATIONS:  Toprol, Benazepril, Coumadin, Zoloft, Glipizide, Lasix and  Pravachol.   IMPRESSION:  1. Symptomatic bradycardia.  2. Paroxysmal atrial fibrillation.  3. Chronic Coumadin therapy.  4. Hypertension.  5. Atypical chest pain.   DISCUSSION:  Overall Ms. [name]  is stable. Her pacemaker is working  normally. She is maintaining sinus rhythm at present. I will plan to see  her back for pacemaker followup in one year.     Doylene Canning. Ladona Ridgel, MD  Electronically Signed    GWT/MedQ  DD: 01/09/2007  DT: 01/09/2007  Job #: [id number]   cc:   Geoffry Paradise, M.D.

## 2011-02-25 NOTE — Cardiovascular Report (Signed)
NAMEDEMEISHA, GERAGHTY NO.:  [id number]   MEDICAL RECORD NO.:  [id number]          PATIENT TYPE:  INP   LOCATION:  2901                         FACILITY:  [facility]   PHYSICIAN:  Charlies Constable, M.D. [facility] DATE OF BIRTH:  [birth year]   DATE OF PROCEDURE:  11/08/2004  DATE OF DISCHARGE:                              CARDIAC CATHETERIZATION   CLINICAL HISTORY:  Ms. Lysne is 76 years old and has a history of remote  heart failure with good systolic function, and has a history of diabetes and  hypertension.  Also, she has paroxysmal atrial fibrillation and has been on  Coumadin.   She was admitted with symptoms of shortness of breath and signs of  congestive heart failure and diuresed with IV Lasix. An echocardiogram  showed an ejection fraction estimated at 30% and she was scheduled for  evaluation with angiography. She also has a history of a previous DDD  pacemaker.   PROCEDURE:  Right heart catheterization was performed percutaneously from  the right femoral vein using a medium sheath and Swan-Ganz thermodilution  catheter.  Left heart catheterization was performed percutaneously above  the right femoral artery using arterial sheath and 6-French pre-formed  coronary catheters. A femoral arterial branch was performed and Omnipaque  contrast was used.  The right femoral artery was closed with AngioSeal at  the end of the procedure.  The patient tolerated the procedure well and left  the laboratory in satisfactory condition.   RESULTS:  1.  The left main coronary artery:  The left main coronary artery is free of      significant disease.  2.  The left anterior descending artery:  The left anterior descending      artery gave rise to 3 diagonal branches and 3 septal perforators.  The      LAD was irregular and there was 70% ostial stenosis in the first      diagonal branch.  3.  The circumflex artery:  The circumflex artery gave rise to an atrial      branch, 3 large  posterolateral branches. and a second marginal branch.  These vessels were free of significant disease.  1.  The right coronary artery:  The right coronary artery was a moderate      sized vessel that gave rise to a conus branch, a right ventricular      branch, posterior descending branch and a posterolateral branch.  These      vessels were free of significant disease.   LEFT VENTRICULOGRAPHY:  The left ventriculogram performed in an RAO  projection had a large area of akinesis of the anterolateral wall and apex.  The anterior wall near the base moved vigorously and inferior wall near the  base moved vigorously.  This had features of Tako-Tsubo cardiomyopathy.   HEMODYNAMIC DATA:  The right atrial pressure was 15 mean, the pulmonary  artery pressure was 56/28 with a mean of 40. The pulmonary wedge pressure  was 21.  Left ventricular pressure was 109/21.  The aortic pressure was  109/52 with mean of 68.  The cardiac  output/cardiac index was 3.6/1.8  meter/minute/meter/sq by Hiram Comber.   CONCLUSION:  1.  Nonobstructive coronary artery disease with irregularities in the left      anterior descending artery and 70% ostial stenosis in the first diagonal      branch.  No significant obstruction in the circumflex and right coronary      artery.  2.  Large area of apical and anterior wall akinesis with an estimated      ejection fraction of 30%.  3.  Moderate elevation in the pulmonary wedge pressure with a mean of 21 mm      and elevation of pulmonary artery pressure.   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 Will plan continued medical therapy.      BB/MEDQ  D:  11/08/2004  T:  11/08/2004  Job:  [id number]   cc:   Geoffry Paradise, M.D.  [address]  [place]  [place] [address]  Fax: [phone]   Charlton Haws, M.D.   cardiopulmonary lab

## 2011-03-02 ENCOUNTER — Ambulatory Visit: Payer: Medicare Other | Admitting: *Deleted

## 2011-03-02 ENCOUNTER — Ambulatory Visit (INDEPENDENT_AMBULATORY_CARE_PROVIDER_SITE_OTHER): Payer: Medicare Other | Admitting: *Deleted

## 2011-03-02 DIAGNOSIS — I495 Sick sinus syndrome: Secondary | ICD-10-CM

## 2011-03-02 DIAGNOSIS — I4891 Unspecified atrial fibrillation: Secondary | ICD-10-CM

## 2011-03-03 NOTE — Op Note (Signed)
  [name], [name]                ACCOUNT NO.:  [id number]  MEDICAL RECORD NO.:  [id number]           PATIENT TYPE:  O  LOCATION:  [facility]                         FACILITY:  [facility]  PHYSICIAN:  Doylene Canning. Ladona Ridgel, MD    DATE OF BIRTH:  March 11, 1935  DATE OF PROCEDURE:  02/21/2011 DATE OF DISCHARGE:                              OPERATIVE REPORT   PROCEDURE PERFORMED:  Removal of previously implanted dual-chamber pacemaker, insertion of a new dual-chamber pacemaker.  INDICATIONS:  Symptomatic complete heart block status post pacemaker with device elective replacement.  INTRODUCTION:  The patient is a very pleasant 76 year old woman with a history of complete heart block.  She is status post pacemaker insertion.  She has reached elective replacement.  PROCEDURE:  After informed consent was obtained, the patient was taken to the diagnostic EP lab in the fasting state.  After the usual preparation and draping, intravenous fentanyl and midazolam was given for sedation.  Lidocaine 30 mL was infiltrated into the left infraclavicular region.  A 5-cm incision was carried out over this region.  Electrocautery was utilized to dissect down to the pacemaker pocket.  The generator was removed without difficulty.  The atrial lead was evaluated on atrial rhythm with AFib.  The pacing impedance was stable in the 500 range.  Attention was then turned to the ventricular lead.  It was removed from the pacemaker generator.  There was transient asystole as the patient had no escape.  She was hooked up to the alligator clips where she was found to have a threshold of less than volt of 0.5 msec.  The pacing impedance was stable at 650 ohms.  With these satisfactory parameters, the pacemaker ventricular lead was connected to the new pacemaker and placed back in the subcutaneous pocket.  The pocket was irrigated antibiotic irrigation, and the incision was closed with 2-0 and 3-0 Vicryl.  Benzoin and  Steri-Strips were painted on the skin.  A pressure dressing was applied, and the patient was returned to her room in satisfactory condition.  COMPLICATIONS:  There were no immediate procedure complications.  RESULTS:  This demonstrate successful removal of previously implanted dual-chamber pacemaker and insertion of a new dual-chamber pacemaker without immediate procedure complications.     Doylene Canning. Ladona Ridgel, MD     GWT/MEDQ  D:  02/21/2011  T:  02/22/2011  Job:  [id number]  Electronically Signed by Lewayne Bunting MD on 03/03/2011 12:52:40 PM

## 2011-03-08 ENCOUNTER — Telehealth: Payer: Self-pay | Admitting: Internal Medicine

## 2011-03-08 ENCOUNTER — Other Ambulatory Visit: Payer: Self-pay | Admitting: *Deleted

## 2011-03-08 MED ORDER — SPIRONOLACTONE 25 MG PO TABS: 25.0000 mg | ORAL_TABLET | Freq: Every day | ORAL | Status: DC

## 2011-03-08 NOTE — Telephone Encounter (Signed)
Pharmacist has question re pt spironolactone. Pharmacist states he got two different direction for the med.

## 2011-03-09 NOTE — Telephone Encounter (Signed)
Sig on Spironolactone was incorrect.  Verified with pharmacist that patient still taking only 1/2 tablet and changed this on pt medication list.  Judithe Modest, CMA

## 2011-03-11 ENCOUNTER — Encounter: Payer: Self-pay | Admitting: Cardiovascular Disease

## 2011-03-11 ENCOUNTER — Other Ambulatory Visit: Payer: Self-pay | Admitting: *Deleted

## 2011-03-11 ENCOUNTER — Ambulatory Visit (INDEPENDENT_AMBULATORY_CARE_PROVIDER_SITE_OTHER): Payer: Medicare Other | Admitting: Cardiovascular Disease

## 2011-03-11 DIAGNOSIS — E7800 Pure hypercholesterolemia, unspecified: Secondary | ICD-10-CM

## 2011-03-11 DIAGNOSIS — M79604 Pain in right leg: Secondary | ICD-10-CM

## 2011-03-11 DIAGNOSIS — I4891 Unspecified atrial fibrillation: Secondary | ICD-10-CM

## 2011-03-11 DIAGNOSIS — F411 Generalized anxiety disorder: Secondary | ICD-10-CM

## 2011-03-11 DIAGNOSIS — R609 Edema, unspecified: Secondary | ICD-10-CM

## 2011-03-11 DIAGNOSIS — I2510 Atherosclerotic heart disease of native coronary artery without angina pectoris: Secondary | ICD-10-CM

## 2011-03-11 DIAGNOSIS — M79609 Pain in unspecified limb: Secondary | ICD-10-CM

## 2011-03-11 MED ORDER — SPIRONOLACTONE 25 MG PO TABS: 12.5000 mg | ORAL_TABLET | Freq: Every day | ORAL | Status: DC

## 2011-03-11 NOTE — Patient Instructions (Addendum)
Your physician recommends that you schedule a follow-up appointment in: OCT 2012 WITH DR [name].  Your physician has requested that you have a lower or upper extremity arterial duplex. This test is an ultrasound of the arteries in the legs or arms. It looks at arterial blood flow in the legs and arms. Allow one hour for Lower and Upper Arterial scans. There are no restrictions or special instructions LOWER  AT PT'S CONVENIENCE  INSUFFIENCY  Your physician has requested that you have a lower or upper extremity venous duplex. This test is an ultrasound of the veins in the legs or arms. It looks at venous blood flow that carries blood from the heart to the legs or arms. Allow one hour for a Lower Venous exam. Allow thirty minutes for an Upper Venous exam. There are no restrictions or special instructions.  LOWER AT PT'S CONVENIENCE

## 2011-03-11 NOTE — Progress Notes (Signed)
Jeanenne is seen today for F/U of DCM history of Takatsubo with EF 25%.  Severe anxiety.  History of chronic afib.  SSS with pacer implant 11/22/2002.   Just had her pacer changed out witout complications.  Wound looks good..  Last cath 10/11 with no CAD EF 35-40%.  Echo 07/10/10 estimated EF 25%.  Has had LE edema and erythema. NO evidence of cellulits.  She has venous insuf and varicosities.  Needs F/U ABI's and venous duplex.  Encouraged her to wear support hose. And limit salt.  No SSCP, palpitatons, dyspnea  ROS: Denies fever, malais, weight loss, blurry vision, decreased visual acuity, cough, sputum, SOB, hemoptysis, pleuritic pain, palpitaitons, heartburn, abdominal pain, melena, lower extremity edema, claudication, or rash.  All other systems reviewed and negative  General: Affect appropriate Healthy:  appears stated age HEENT: normal Neck supple with no adenopathy JVP normal no bruits no thyromegaly Lungs clear with no wheezing and good diaphragmatic motion Heart:  S1/S2 no murmur,rub, gallop or click PMI normal Pacer site A Abdomen: benighn, BS positve, no tenderness, no AAA no bruit.  No HSM or HJR Distal pulses intact with no bruits Plus one bilateral  Edema with varicosities Neuro non-focal Skin warm and dry No muscular weakness   Current Outpatient Prescriptions  Medication Sig Dispense Refill  . ALPRAZolam (XANAX) 0.25 MG tablet Take 0.25 mg by mouth 2 (two) times daily as needed.       . benazepril (LOTENSIN) 20 MG tablet Take 10 mg by mouth daily.       . furosemide (LASIX) 40 MG tablet Take 40 mg by mouth 2 (two) times daily.       Marland Kitchen glipiZIDE-metformin (METAGLIP) 5-500 MG per tablet Take 1 tablet by mouth 2 (two) times daily before a meal.        . memantine (NAMENDA) 10 MG tablet Take 10 mg by mouth 2 (two) times daily.        . metoprolol (TOPROL-XL) 50 MG 24 hr tablet Take 50 mg by mouth daily.        . nitroGLYCERIN (NITROSTAT) 0.4 MG SL tablet Place 0.4 mg under the  tongue every 5 (five) minutes as needed.        Marland Kitchen PARoxetine (PAXIL) 20 MG tablet Take 20 mg by mouth.        . spironolactone (ALDACTONE) 25 MG tablet 25 mg. 1/2 tab po qd       . warfarin (COUMADIN) 4 MG tablet Take by mouth as directed.        Marland Kitchen DISCONTD: pravastatin (PRAVACHOL) 20 MG tablet Take 20 mg by mouth daily.          Allergies  Review of patient's allergies indicates no known allergies.  Electrocardiogram: Afib with V pacing  Assessment and Plan

## 2011-03-11 NOTE — Assessment & Plan Note (Signed)
Cath 10/11 no critical disease.  Previous MI in the setting of Taka tsubo myopathy

## 2011-03-11 NOTE — Assessment & Plan Note (Signed)
Continue coumadin.  Back up pacer and rate ok

## 2011-03-11 NOTE — Assessment & Plan Note (Signed)
F/U [name]  Now at assisted living Daughter take good care of her

## 2011-03-11 NOTE — Progress Notes (Signed)
Addended by: Scherrie Bateman E on: 03/11/2011 12:37 PM   Modules accepted: Orders

## 2011-03-11 NOTE — Assessment & Plan Note (Signed)
Check ABI but pulses seem fine.  Document venous insuf with duplex.  Continue higher dose lasix and support hose

## 2011-03-11 NOTE — Assessment & Plan Note (Signed)
Cholesterol is at goal.  Continue current dose of statin and diet Rx.  No myalgias or side effects.  F/U  LFT's in 6 months. No results found for this basename: LDLCALC             

## 2011-03-18 ENCOUNTER — Other Ambulatory Visit: Payer: Self-pay | Admitting: *Deleted

## 2011-03-18 ENCOUNTER — Encounter (INDEPENDENT_AMBULATORY_CARE_PROVIDER_SITE_OTHER): Payer: Medicare Other | Admitting: *Deleted

## 2011-03-18 DIAGNOSIS — M79604 Pain in right leg: Secondary | ICD-10-CM

## 2011-03-18 DIAGNOSIS — I739 Peripheral vascular disease, unspecified: Secondary | ICD-10-CM

## 2011-03-22 ENCOUNTER — Encounter: Payer: Self-pay | Admitting: Cardiovascular Disease

## 2011-03-25 ENCOUNTER — Encounter (INDEPENDENT_AMBULATORY_CARE_PROVIDER_SITE_OTHER): Payer: Medicare Other | Admitting: *Deleted

## 2011-03-25 DIAGNOSIS — M79604 Pain in right leg: Secondary | ICD-10-CM

## 2011-03-25 DIAGNOSIS — R609 Edema, unspecified: Secondary | ICD-10-CM

## 2011-03-25 DIAGNOSIS — M79605 Pain in left leg: Secondary | ICD-10-CM

## 2011-03-25 DIAGNOSIS — M79609 Pain in unspecified limb: Secondary | ICD-10-CM

## 2011-03-28 ENCOUNTER — Encounter: Payer: Self-pay | Admitting: Cardiovascular Disease

## 2011-03-29 ENCOUNTER — Other Ambulatory Visit: Payer: Self-pay | Admitting: *Deleted

## 2011-03-29 MED ORDER — WARFARIN SODIUM 4 MG PO TABS: ORAL_TABLET | ORAL | Status: DC

## 2011-03-30 ENCOUNTER — Encounter: Payer: Medicare Other | Admitting: *Deleted

## 2011-04-05 ENCOUNTER — Ambulatory Visit (INDEPENDENT_AMBULATORY_CARE_PROVIDER_SITE_OTHER): Payer: Medicare Other | Admitting: *Deleted

## 2011-04-05 DIAGNOSIS — I4891 Unspecified atrial fibrillation: Secondary | ICD-10-CM

## 2011-04-05 LAB — POCT INR: INR: 2.2

## 2011-05-03 ENCOUNTER — Encounter: Payer: Medicare Other | Admitting: *Deleted

## 2011-05-06 ENCOUNTER — Ambulatory Visit (INDEPENDENT_AMBULATORY_CARE_PROVIDER_SITE_OTHER): Payer: Medicare Other | Admitting: *Deleted

## 2011-05-06 DIAGNOSIS — I4891 Unspecified atrial fibrillation: Secondary | ICD-10-CM

## 2011-05-06 LAB — POCT INR: INR: 2.3

## 2011-05-31 ENCOUNTER — Encounter: Payer: Self-pay | Admitting: Internal Medicine

## 2011-05-31 ENCOUNTER — Ambulatory Visit (INDEPENDENT_AMBULATORY_CARE_PROVIDER_SITE_OTHER): Payer: Medicare Other | Admitting: *Deleted

## 2011-05-31 ENCOUNTER — Ambulatory Visit (INDEPENDENT_AMBULATORY_CARE_PROVIDER_SITE_OTHER): Payer: Medicare Other | Admitting: Internal Medicine

## 2011-05-31 DIAGNOSIS — Z950 Presence of cardiac pacemaker: Secondary | ICD-10-CM

## 2011-05-31 DIAGNOSIS — I495 Sick sinus syndrome: Secondary | ICD-10-CM

## 2011-05-31 DIAGNOSIS — I428 Other cardiomyopathies: Secondary | ICD-10-CM

## 2011-05-31 DIAGNOSIS — I4891 Unspecified atrial fibrillation: Secondary | ICD-10-CM

## 2011-05-31 LAB — PACEMAKER DEVICE OBSERVATION
AL AMPLITUDE: 1 mv
BATTERY VOLTAGE: 2.8 V
BRDY-0002RV: 60 {beats}/min
RV LEAD AMPLITUDE: 15.68 mv
RV LEAD IMPEDENCE PM: 819 Ohm

## 2011-05-31 LAB — POCT INR: INR: 1.8

## 2011-05-31 NOTE — Progress Notes (Signed)
HPI [name] returns today for followup. She is a pleasant 76 yo woman with a h/o symptomatic bradycardia, chronic atrial fib, s/p PPM. She has HTN. She denies c/p. She admits to being sedentary. No other complaints. No Known Allergies   Current Outpatient Prescriptions  Medication Sig Dispense Refill  . ALPRAZolam (XANAX) 0.25 MG tablet Take 0.25 mg by mouth 2 (two) times daily as needed.       . benazepril (LOTENSIN) 20 MG tablet Take 10 mg by mouth daily.       . furosemide (LASIX) 40 MG tablet Take 40 mg by mouth 2 (two) times daily.       Marland Kitchen glipiZIDE-metformin (METAGLIP) 5-500 MG per tablet Take 1 tablet by mouth 2 (two) times daily before a meal.        . memantine (NAMENDA) 10 MG tablet Take 10 mg by mouth 2 (two) times daily.        . metoprolol (TOPROL-XL) 50 MG 24 hr tablet Take 50 mg by mouth daily.        . nitroGLYCERIN (NITROSTAT) 0.4 MG SL tablet Place 0.4 mg under the tongue every 5 (five) minutes as needed.        Marland Kitchen PARoxetine (PAXIL) 20 MG tablet Take 20 mg by mouth.        . spironolactone (ALDACTONE) 25 MG tablet Take 0.5 tablets (12.5 mg total) by mouth daily.  45 tablet  3  . warfarin (COUMADIN) 4 MG tablet Take as directed by Anticoagulation clinic   30 tablet  3     Past Medical History  Diagnosis Date  . HYPERCHOLESTEROLEMIA   . ANXIETY   . MYOCARDIAL INFARCTION   . CAD   . CARDIOMYOPATHY   . PAROXYSMAL ATRIAL FIBRILLATION   . SICK SINUS SYNDROME   . BRADYCARDIA   . CHF   . GERD   . Edema   . Shortness of breath     ROS:   All systems reviewed and negative except as noted in the HPI.   Past Surgical History  Procedure Date  . Coronary angioplasty   . Pacemaker insertion   . Cervical spine surgery   . Back surgery   . Radical hysterectomy      No family history on file.   History   Social History  . Marital Status: Widowed    Spouse Name: N/A    Number of Children: N/A  . Years of Education: N/A   Occupational History  . Not  on file.   Social History Main Topics  . Smoking status: Never Smoker   . Smokeless tobacco: Not on file  . Alcohol Use: Not on file  . Drug Use: Not on file  . Sexually Active: Not on file   Other Topics Concern  . Not on file   Social History Narrative  . No narrative on file     BP 125/76  Pulse 66  Ht 5\' 6"  (1.676 m)  Wt 208 lb (94.348 kg)  BMI 33.57 kg/m2  Physical Exam:  Well appearing NAD HEENT: Unremarkable Neck:  No JVD, no thyromegally Lymphatics:  No adenopathy Back:  No CVA tenderness Lungs:  Clear. Well healed PPM incision. HEART:  Iregular rate rhythm, no murmurs, no rubs, no clicks Abd:  soft, positive bowel sounds, no organomegally, no rebound, no guarding Ext:  2 plus pulses, no edema, no cyanosis, no clubbing Skin:  No rashes no nodules Neuro:  CN II through XII intact, motor grossly intact  DEVICE  Normal device function.  See PaceArt for details.   Assess/Plan:

## 2011-05-31 NOTE — Assessment & Plan Note (Signed)
Her device is working normally and her incision is well healed. Continue current followup in several months.

## 2011-05-31 NOTE — Assessment & Plan Note (Signed)
She is asymptomatic. Her rate appears to be well controlled. She will continue her current meds.

## 2011-06-03 ENCOUNTER — Encounter: Payer: Medicare Other | Admitting: *Deleted

## 2011-06-16 ENCOUNTER — Emergency Department (HOSPITAL_COMMUNITY)
Admission: EM | Admit: 2011-06-16 | Discharge: 2011-06-16 | Disposition: A | Discharge status: Home / Self Care | Payer: Medicare Other | Attending: Emergency Medicine | Admitting: Emergency Medicine

## 2011-06-16 ENCOUNTER — Emergency Department (HOSPITAL_COMMUNITY): Payer: Medicare Other

## 2011-06-16 DIAGNOSIS — F411 Generalized anxiety disorder: Secondary | ICD-10-CM | POA: Insufficient documentation

## 2011-06-16 DIAGNOSIS — R079 Chest pain, unspecified: Secondary | ICD-10-CM | POA: Insufficient documentation

## 2011-06-16 DIAGNOSIS — R0789 Other chest pain: Secondary | ICD-10-CM

## 2011-06-16 DIAGNOSIS — F0390 Unspecified dementia without behavioral disturbance: Secondary | ICD-10-CM | POA: Insufficient documentation

## 2011-06-16 DIAGNOSIS — I252 Old myocardial infarction: Secondary | ICD-10-CM | POA: Insufficient documentation

## 2011-06-16 DIAGNOSIS — I10 Essential (primary) hypertension: Secondary | ICD-10-CM | POA: Insufficient documentation

## 2011-06-16 DIAGNOSIS — I4891 Unspecified atrial fibrillation: Secondary | ICD-10-CM | POA: Insufficient documentation

## 2011-06-16 DIAGNOSIS — M1990 Unspecified osteoarthritis, unspecified site: Secondary | ICD-10-CM | POA: Insufficient documentation

## 2011-06-16 DIAGNOSIS — Z79899 Other long term (current) drug therapy: Secondary | ICD-10-CM | POA: Insufficient documentation

## 2011-06-16 DIAGNOSIS — K219 Gastro-esophageal reflux disease without esophagitis: Secondary | ICD-10-CM | POA: Insufficient documentation

## 2011-06-16 DIAGNOSIS — N390 Urinary tract infection, site not specified: Secondary | ICD-10-CM | POA: Insufficient documentation

## 2011-06-16 DIAGNOSIS — E785 Hyperlipidemia, unspecified: Secondary | ICD-10-CM | POA: Insufficient documentation

## 2011-06-16 DIAGNOSIS — E119 Type 2 diabetes mellitus without complications: Secondary | ICD-10-CM | POA: Insufficient documentation

## 2011-06-16 DIAGNOSIS — I509 Heart failure, unspecified: Secondary | ICD-10-CM | POA: Insufficient documentation

## 2011-06-16 LAB — URINALYSIS, ROUTINE W REFLEX MICROSCOPIC
Nitrite: POSITIVE — AB
Protein, ur: NEGATIVE mg/dL
Specific Gravity, Urine: 1.015 (ref 1.005–1.030)
Urobilinogen, UA: 0.2 mg/dL (ref 0.0–1.0)

## 2011-06-16 LAB — CBC
Hemoglobin: 13.4 g/dL (ref 12.0–15.0)
MCH: 31.6 pg (ref 26.0–34.0)
Platelets: 318 10*3/uL (ref 150–400)
RBC: 4.24 MIL/uL (ref 3.87–5.11)
WBC: 10.1 10*3/uL (ref 4.0–10.5)

## 2011-06-16 LAB — DIFFERENTIAL
Basophils Absolute: 0 10*3/uL (ref 0.0–0.1)
Basophils Relative: 0 % (ref 0–1)
Eosinophils Absolute: 0.1 10*3/uL (ref 0.0–0.7)
Monocytes Relative: 7 % (ref 3–12)
Neutro Abs: 7.2 10*3/uL (ref 1.7–7.7)
Neutrophils Relative %: 72 % (ref 43–77)

## 2011-06-16 LAB — POCT I-STAT TROPONIN I

## 2011-06-16 LAB — PROTIME-INR
INR: 2.58 — ABNORMAL HIGH (ref 0.00–1.49)
Prothrombin Time: 28.1 seconds — ABNORMAL HIGH (ref 11.6–15.2)

## 2011-06-16 LAB — BASIC METABOLIC PANEL
Chloride: 101 mEq/L (ref 96–112)
GFR calc Af Amer: >60 mL/min (ref >60)
GFR calc non Af Amer: 56 mL/min — ABNORMAL LOW (ref >60)
Potassium: 4.6 mEq/L (ref 3.5–5.1)
Sodium: 141 mEq/L (ref 135–145)

## 2011-06-16 LAB — URINE MICROSCOPIC-ADD ON

## 2011-06-16 LAB — PRO B NATRIURETIC PEPTIDE: Pro B Natriuretic peptide (BNP): 514.5 pg/mL — ABNORMAL HIGH (ref 0–125)

## 2011-06-18 LAB — URINE CULTURE: Colony Count: >=100000

## 2011-06-20 NOTE — Consult Note (Addendum)
[name], POSA                ACCOUNT NO.:  [id number]  MEDICAL RECORD NO.:  [id number]  LOCATION:  [facility]                         FACILITY:  [facility]  PHYSICIAN:  [name]. [name], MD, FACCDATE OF BIRTH:  05-28-1935  DATE OF CONSULTATION:  06/16/2011 DATE OF DISCHARGE:                                CONSULTATION   PRIMARY CARDIOLOGIST:  [name]. [name], M.D.  ELECTROPHYSIOLOGIST:  Doylene Canning. Ladona Ridgel, M.D.  PRIMARY CARE DOCTOR:  Geoffry Paradise, M.D.  CHIEF COMPLAINT:  Chest discomfort.  HISTORY OF PRESENT ILLNESS:  [name] is a 76 year old female with a history of chronic AFib, pacemaker implantation, nonobstructive CAD by cath in November 2011 and nonischemic cardiomyopathy with an EF of 25%, who presented to [facility] at the request of her daughter for chest discomfort.  For the past two weeks, she has an occasional fleeting chest pain, lasting seconds to minutes.  She has a difficult time describing this, stating that it is not necessarily hurt, but feels like a tapping sensation.  She feels it occasionally when she is walking and it is relieved by rest.  It is not associated with any other symptoms including shortness of breath, nausea, vomiting, or diaphoresis.  She does not have increased symptoms when taking a deep breath.  When mentioning these symptoms to her daughter and stating that she should make an appointment with Dr. [name] soon, the patient's daughter became concerned and so persuaded the patient has come to the ER.  Now, she denies any pain, and wants to go home.  INR is therapeutic.  Troponin is negative x1.  UA demonstrates UTI and she was treated with one dose of Rocephin in the ER.  EKG is ventricular paced.  PAST MEDICAL HISTORY: 1. Nonobstructive CAD by cath with 40% LAD, otherwise, normal coronary     arteries, November 2011. 2. Nonischemic cardiomyopathy with most recent EF of 20% by echo,     October 2011. 3. History of Takotsubo  cardiomyopathy in 2006 with positive enzymes     at that time. 4. Complete heart block, status post pacemaker, change in May 2012. 5. Atrial fibrillation, on chronic Coumadin. 6. Hypertension. 7. Hyperlipidemia. 8. GERD. 9. Osteoarthritis. 10.History of elevated sed rate. 11.Diabetes mellitus. 12.Valvular heart disease with mild MR and moderate TR by echo,     November 2011. 13.Status post C-spine surgery. 14.Status post back surgery. 15.Status post hysterectomy. 16.History of spastic movements, on treatment with Xanax.  MEDICATIONS: 1. Xanax 0.25 mg b.i.d. p.r.n. 2. Lotensin 10 mg daily. 3. Lasix 40 mg, CAD. 4. Glipizide/Metformin 05/500 mg b.i.d. 5. Namenda 10 mg b.i.d. 6. Toprol XL 50 mg daily. 7. Nitroglycerin subcu 5 minutes up to 3 doses for chest pain. 8. Aldactone 12.5 mg daily. 9. Coumadin as directed.  ALLERGIES:  No known drug allergies.  SOCIAL HISTORY:  Ms. [name] lives in [place].  She is widowed, has 3 children.  Her daughter accompanies her today.  She denies any tobacco or alcohol use.  FAMILY HISTORY:  Positive for coronary artery disease in her brother.  REVIEW OF SYSTEMS:  No fevers, chills, nausea, vomiting, or diarrhea. She does report hard of hearing.  All other systems reviewed, otherwise negative.  LABORATORY DATA:  WBC 10.1, hemoglobin 13.4, hematocrit 39.1, platelet count 318.  Sodium 141, potassium 4.6, chloride 101, CO2 of 20, glucose 131, BUN 25, creatinine 0.47, INR 2.58, troponin negative x1.  BNP 514. UA showed positive nitrate and large leukocytes.  EKG:  V paced with 75 beats per minute.  RADIOLOGIC STUDIES:  Chest x-ray showed no acute abnormality.  PHYSICAL EXAMINATION:  VITAL SIGNS: Temperature 98.9, pulse 79, respirations 11, blood pressure 102/73, pulse ox 99% on room air. GENERAL:  This is a pleasant elderly white female in no acute distress. HEENT:  Normocephalic, atraumatic with extraocular movements intact  and clear sclerae.  Nares are without discharge. NECK:  Supple without carotid bruit. HEART:  Auscultation of the heart reveals regular rate and rhythm with S1 and S2 without murmurs, rubs, or gallops. LUNGS:  Clear to auscultation bilaterally without wheeze, rales, or rhonchi. ABDOMEN:  Soft, nontender, nondistended with positive bowel sounds. EXTREMITIES:  Warm, dry, and without edema.  She has 2+ pedal pulses bilaterally. NEUROLOGICAL:  She is alert and oriented x3, responds to questions appropriately with a normal affect, but does occasionally have difficulty understanding question I asked, but is able to respond when rephrased or repeated.  Occasionally, she displayed some spastic throwing her arm up in the air with quick return to baseline pose, but her daughter states that she is followed for this and it occurs whenever she is starved-out.  ASSESSMENT/PLAN:  The patient was seen and examined by Dr. Clarene Duke. This is a 76 year old lady with a history of nonischemic cardiomyopathy; atrial fibrillation, therapeutic, on Coumadin; pacemaker implantation; and mild nonobstructive coronary artery disease by cath in November 2011; who presents with an episode of atypical chest pain.  She denies active chest pain and cardiac enzymes were negative x1.  EKG is paced. At this time, I felt that the patient can be discharged from the ER with close outpatient follow with Dr. [name] in 1-2 weeks.  The patient is requested sublingual nitroglycerine at home, which we will refill.  Our office will call her with the appointment with Dr. [name].  We have also spoke in the ER who plans to treat her urinary tract infection.  I have instructed the patient and her daughter to be aware that some antibiotics could interfere with her INR check and that she should call us tomorrow to ensure she does have follow up in the event that she is prescribed any antibiotics with the potential for interaction by the  ER once that is decided.     Dayna Dunn, P.A.C.   ______________________________ [name] [name], MD, [facility]    DD/MEDQ  D:  06/16/2011  T:  06/17/2011  Job:  [id number]  cc:   [name]. [name], MD, [facility] Doylene Canning. Ladona Ridgel, MD Geoffry Paradise, M.D.  Electronically Signed by Ronie Spies  on 07/07/2011 07:02:55 PM Electronically Signed by Charlton Haws MD [facility] on 07/11/2011 06:23:01 PM

## 2011-06-28 ENCOUNTER — Encounter: Payer: Medicare Other | Admitting: *Deleted

## 2011-07-14 ENCOUNTER — Encounter: Payer: Medicare Other | Admitting: *Deleted

## 2011-07-15 ENCOUNTER — Encounter: Payer: Medicare Other | Admitting: *Deleted

## 2011-07-18 LAB — DIFFERENTIAL
Basophils Absolute: 0.1
Basophils Relative: 1
Eosinophils Absolute: 0.1 — ABNORMAL LOW
Eosinophils Relative: 1
Monocytes Absolute: 0.6

## 2011-07-18 LAB — CBC
HCT: 38.2
Platelets: 418 — ABNORMAL HIGH
RBC: 4.23
WBC: 11.3 — ABNORMAL HIGH

## 2011-07-18 LAB — COMPREHENSIVE METABOLIC PANEL
AST: 33
Albumin: 4.1
Alkaline Phosphatase: 77
BUN: 16
CO2: 27
Chloride: 104
GFR calc Af Amer: >60
GFR calc non Af Amer: 56 — ABNORMAL LOW
Potassium: 4.1
Total Bilirubin: 0.5

## 2011-07-18 LAB — WET PREP, GENITAL: Yeast Wet Prep HPF POC: NONE SEEN

## 2011-07-18 LAB — URINALYSIS, ROUTINE W REFLEX MICROSCOPIC
Glucose, UA: NEGATIVE
Ketones, ur: 15 — AB
Protein, ur: 30 — AB

## 2011-07-18 LAB — URINE MICROSCOPIC-ADD ON

## 2011-07-21 LAB — URINALYSIS, ROUTINE W REFLEX MICROSCOPIC
Nitrite: NEGATIVE
Specific Gravity, Urine: 1.016
Urobilinogen, UA: 1

## 2011-07-21 LAB — CBC
HCT: 32.7 — ABNORMAL LOW
Hemoglobin: 11.5 — ABNORMAL LOW
Platelets: 330
RBC: 3.66 — ABNORMAL LOW
WBC: 12.6 — ABNORMAL HIGH

## 2011-07-21 LAB — URINE MICROSCOPIC-ADD ON

## 2011-07-21 LAB — COMPREHENSIVE METABOLIC PANEL
Albumin: 3.5
Alkaline Phosphatase: 61
BUN: 12
Chloride: 104
Potassium: 3.7
Total Bilirubin: 0.4

## 2011-07-21 LAB — DIFFERENTIAL
Basophils Absolute: 0
Basophils Relative: 0
Eosinophils Relative: 1
Monocytes Absolute: 0.6
Neutro Abs: 10.1 — ABNORMAL HIGH

## 2011-07-21 LAB — APTT: aPTT: 42 — ABNORMAL HIGH

## 2011-07-21 LAB — POCT CARDIAC MARKERS: Troponin i, poc: <0.05

## 2011-07-21 LAB — B-NATRIURETIC PEPTIDE (CONVERTED LAB): Pro B Natriuretic peptide (BNP): 203 — ABNORMAL HIGH

## 2011-07-22 ENCOUNTER — Ambulatory Visit (INDEPENDENT_AMBULATORY_CARE_PROVIDER_SITE_OTHER): Payer: Medicare Other | Admitting: *Deleted

## 2011-07-22 DIAGNOSIS — I4891 Unspecified atrial fibrillation: Secondary | ICD-10-CM

## 2011-07-27 ENCOUNTER — Other Ambulatory Visit: Payer: Self-pay

## 2011-07-27 DIAGNOSIS — R0602 Shortness of breath: Secondary | ICD-10-CM

## 2011-07-27 MED ORDER — FUROSEMIDE 40 MG PO TABS: 40.0000 mg | ORAL_TABLET | Freq: Two times a day (BID) | ORAL | Status: DC

## 2011-08-12 ENCOUNTER — Other Ambulatory Visit: Payer: Self-pay

## 2011-08-12 MED ORDER — ALPRAZOLAM 0.25 MG PO TABS: 0.2500 mg | ORAL_TABLET | Freq: Two times a day (BID) | ORAL | Status: DC

## 2011-08-19 ENCOUNTER — Ambulatory Visit (INDEPENDENT_AMBULATORY_CARE_PROVIDER_SITE_OTHER): Payer: Medicare Other | Admitting: Cardiovascular Disease

## 2011-08-19 ENCOUNTER — Encounter: Payer: Self-pay | Admitting: Cardiovascular Disease

## 2011-08-19 ENCOUNTER — Ambulatory Visit (INDEPENDENT_AMBULATORY_CARE_PROVIDER_SITE_OTHER): Payer: Medicare Other | Admitting: *Deleted

## 2011-08-19 DIAGNOSIS — F411 Generalized anxiety disorder: Secondary | ICD-10-CM

## 2011-08-19 DIAGNOSIS — Z7901 Long term (current) use of anticoagulants: Secondary | ICD-10-CM

## 2011-08-19 DIAGNOSIS — R0602 Shortness of breath: Secondary | ICD-10-CM

## 2011-08-19 DIAGNOSIS — E7800 Pure hypercholesterolemia, unspecified: Secondary | ICD-10-CM

## 2011-08-19 DIAGNOSIS — I2510 Atherosclerotic heart disease of native coronary artery without angina pectoris: Secondary | ICD-10-CM

## 2011-08-19 DIAGNOSIS — I4891 Unspecified atrial fibrillation: Secondary | ICD-10-CM

## 2011-08-19 LAB — BASIC METABOLIC PANEL
BUN: 16 mg/dL (ref 6–23)
Calcium: 9.2 mg/dL (ref 8.4–10.5)
GFR: 57.48 mL/min — ABNORMAL LOW (ref >60.00)
Glucose, Bld: 167 mg/dL — ABNORMAL HIGH (ref 70–99)

## 2011-08-19 MED ORDER — ALPRAZOLAM 0.25 MG PO TABS: 0.2500 mg | ORAL_TABLET | Freq: Two times a day (BID) | ORAL | Status: DC

## 2011-08-19 NOTE — Assessment & Plan Note (Signed)
Cholesterol is at goal.  Continue current dose of statin and diet Rx.  No myalgias or side effects.  F/U  LFT's in 6 months. No results found for this basename: LDLCALC             

## 2011-08-19 NOTE — Assessment & Plan Note (Signed)
Chronic afib with good rate control and anticoagulation  Pacer back up.  F/U Ladona Ridgel and coumadin clinic

## 2011-08-19 NOTE — Progress Notes (Signed)
[name] is seen today for F/U of DCM history of Takatsubo with EF 25%. Severe anxiety. History of chronic afib. SSS with pacer implant 11/22/2002. Just had her pacer changed out witout complications. Wound looks good.. Last cath 10/11 with no CAD EF 35-40%. Echo 07/10/10 estimated EF 25%. Has had LE edema and erythema. NO evidence of cellulits. She has venous insuf and varicosities. Duplex showed no DVT and no significant arterial disease.   No SSCP, palpitatons, dyspnea  Living at [facility] and doing well.  Danced last week   ROS: Denies fever, malais, weight loss, blurry vision, decreased visual acuity, cough, sputum, SOB, hemoptysis, pleuritic pain, palpitaitons, heartburn, abdominal pain, melena, lower extremity edema, claudication, or rash.  All other systems reviewed and negative  General: Affect appropriate Healthy:  appears stated age HEENT: normal Neck supple with no adenopathy JVP normal no bruits no thyromegaly Lungs clear with no wheezing and good diaphragmatic motion Heart:  S1/S2 no murmur,rub, gallop or click PMI normal Abdomen: benighn, BS positve, no tenderness, no AAA no bruit.  No HSM or HJR Distal pulses intact with no bruits No edema Neuro non-focal Skin warm and dry No muscular weakness   Current Outpatient Prescriptions  Medication Sig Dispense Refill  . ALPRAZolam (XANAX) 0.25 MG tablet Take 1 tablet (0.25 mg total) by mouth 2 (two) times daily.  60 tablet  3  . benazepril (LOTENSIN) 20 MG tablet Take 10 mg by mouth daily.       . furosemide (LASIX) 40 MG tablet Take 1 tablet (40 mg total) by mouth 2 (two) times daily.  30 tablet  3  . glipiZIDE-metformin (METAGLIP) 5-500 MG per tablet Take 1 tablet by mouth 2 (two) times daily before a meal.        . memantine (NAMENDA) 10 MG tablet Take 10 mg by mouth 2 (two) times daily.        . metoprolol (TOPROL-XL) 50 MG 24 hr tablet Take 50 mg by mouth daily.        . nitroGLYCERIN (NITROSTAT) 0.4 MG SL tablet Place 0.4  mg under the tongue every 5 (five) minutes as needed.        Marland Kitchen PARoxetine (PAXIL) 20 MG tablet Take 20 mg by mouth.        . spironolactone (ALDACTONE) 25 MG tablet Take 0.5 tablets (12.5 mg total) by mouth daily.  45 tablet  3  . warfarin (COUMADIN) 4 MG tablet Take as directed by Anticoagulation clinic   30 tablet  3    Allergies  Review of patient's allergies indicates no known allergies.  Electrocardiogram:  07/07/11  Afib with V pacing  Assessment and Plan

## 2011-08-19 NOTE — Assessment & Plan Note (Signed)
Improved  Refill for Xanax called in

## 2011-08-19 NOTE — Assessment & Plan Note (Signed)
Stable with no angina and good activity level.  Continue medical Rx  

## 2011-08-19 NOTE — Assessment & Plan Note (Signed)
I think she wakes up at night due to anxiety.  Will check BMET and BNP

## 2011-08-19 NOTE — Patient Instructions (Signed)
Your physician wants you to follow-up in: 6 MONTHS You will receive a reminder letter in the mail two months in advance. If you don't receive a letter, please call our office to schedule the follow-up appointment.   Your physician recommends that you return for lab work in: TODAY  

## 2011-08-22 ENCOUNTER — Encounter: Payer: Self-pay | Admitting: *Deleted

## 2011-09-02 ENCOUNTER — Encounter: Payer: Medicare Other | Admitting: *Deleted

## 2011-09-16 ENCOUNTER — Ambulatory Visit (INDEPENDENT_AMBULATORY_CARE_PROVIDER_SITE_OTHER): Payer: Medicare Other | Admitting: *Deleted

## 2011-09-16 DIAGNOSIS — Z7901 Long term (current) use of anticoagulants: Secondary | ICD-10-CM

## 2011-09-16 DIAGNOSIS — I4891 Unspecified atrial fibrillation: Secondary | ICD-10-CM

## 2011-10-10 ENCOUNTER — Other Ambulatory Visit: Payer: Self-pay | Admitting: *Deleted

## 2011-10-10 MED ORDER — WARFARIN SODIUM 4 MG PO TABS: ORAL_TABLET | ORAL | Status: DC

## 2011-10-14 ENCOUNTER — Ambulatory Visit (INDEPENDENT_AMBULATORY_CARE_PROVIDER_SITE_OTHER): Payer: Medicare Other | Admitting: *Deleted

## 2011-10-14 DIAGNOSIS — Z7901 Long term (current) use of anticoagulants: Secondary | ICD-10-CM

## 2011-10-14 DIAGNOSIS — I4891 Unspecified atrial fibrillation: Secondary | ICD-10-CM

## 2011-10-20 ENCOUNTER — Other Ambulatory Visit: Payer: Self-pay | Admitting: Cardiovascular Disease

## 2011-10-20 MED ORDER — METOPROLOL SUCCINATE ER 50 MG PO TB24: 50.0000 mg | ORAL_TABLET | Freq: Every day | ORAL | Status: DC

## 2011-11-10 ENCOUNTER — Other Ambulatory Visit: Payer: Self-pay | Admitting: Cardiovascular Disease

## 2011-11-10 DIAGNOSIS — R0602 Shortness of breath: Secondary | ICD-10-CM

## 2011-11-10 MED ORDER — FUROSEMIDE 40 MG PO TABS: 40.0000 mg | ORAL_TABLET | Freq: Two times a day (BID) | ORAL | Status: DC

## 2011-11-11 ENCOUNTER — Encounter (INDEPENDENT_AMBULATORY_CARE_PROVIDER_SITE_OTHER): Payer: Medicare Other

## 2011-11-11 ENCOUNTER — Ambulatory Visit (INDEPENDENT_AMBULATORY_CARE_PROVIDER_SITE_OTHER): Payer: Self-pay | Admitting: Cardiology

## 2011-11-11 ENCOUNTER — Encounter: Payer: Medicare Other | Admitting: *Deleted

## 2011-11-11 ENCOUNTER — Encounter: Payer: Self-pay | Admitting: Cardiovascular Disease

## 2011-11-11 DIAGNOSIS — R0989 Other specified symptoms and signs involving the circulatory and respiratory systems: Secondary | ICD-10-CM

## 2011-11-11 DIAGNOSIS — I4891 Unspecified atrial fibrillation: Secondary | ICD-10-CM

## 2011-11-11 DIAGNOSIS — Z7901 Long term (current) use of anticoagulants: Secondary | ICD-10-CM

## 2011-11-11 LAB — POCT INR: INR: 3.2

## 2011-11-12 ENCOUNTER — Encounter: Payer: Self-pay | Admitting: Cardiovascular Disease

## 2011-11-14 ENCOUNTER — Encounter: Payer: Medicare Other | Admitting: *Deleted

## 2011-12-09 ENCOUNTER — Ambulatory Visit (INDEPENDENT_AMBULATORY_CARE_PROVIDER_SITE_OTHER): Payer: Medicare Other | Admitting: *Deleted

## 2011-12-09 DIAGNOSIS — I4891 Unspecified atrial fibrillation: Secondary | ICD-10-CM

## 2011-12-09 DIAGNOSIS — Z7901 Long term (current) use of anticoagulants: Secondary | ICD-10-CM

## 2011-12-30 ENCOUNTER — Ambulatory Visit (INDEPENDENT_AMBULATORY_CARE_PROVIDER_SITE_OTHER): Payer: Medicare Other

## 2011-12-30 DIAGNOSIS — Z7901 Long term (current) use of anticoagulants: Secondary | ICD-10-CM

## 2011-12-30 DIAGNOSIS — I4891 Unspecified atrial fibrillation: Secondary | ICD-10-CM

## 2012-01-27 ENCOUNTER — Ambulatory Visit (INDEPENDENT_AMBULATORY_CARE_PROVIDER_SITE_OTHER): Payer: Medicare Other | Admitting: Pharmacist

## 2012-01-27 DIAGNOSIS — Z7901 Long term (current) use of anticoagulants: Secondary | ICD-10-CM

## 2012-01-27 DIAGNOSIS — I4891 Unspecified atrial fibrillation: Secondary | ICD-10-CM

## 2012-01-27 LAB — POCT INR: INR: 2.5

## 2012-01-31 ENCOUNTER — Encounter (HOSPITAL_COMMUNITY): Payer: Self-pay | Admitting: *Deleted

## 2012-01-31 ENCOUNTER — Emergency Department (HOSPITAL_COMMUNITY): Payer: Medicare Other

## 2012-01-31 ENCOUNTER — Observation Stay (HOSPITAL_COMMUNITY)
Admission: EM | Admit: 2012-01-31 | Discharge: 2012-02-01 | Disposition: A | Discharge status: Home / Self Care | Payer: Medicare Other | Attending: Cardiology | Admitting: Cardiology

## 2012-01-31 DIAGNOSIS — Z950 Presence of cardiac pacemaker: Secondary | ICD-10-CM | POA: Insufficient documentation

## 2012-01-31 DIAGNOSIS — I509 Heart failure, unspecified: Secondary | ICD-10-CM

## 2012-01-31 DIAGNOSIS — I2510 Atherosclerotic heart disease of native coronary artery without angina pectoris: Secondary | ICD-10-CM | POA: Insufficient documentation

## 2012-01-31 DIAGNOSIS — Z7901 Long term (current) use of anticoagulants: Secondary | ICD-10-CM | POA: Insufficient documentation

## 2012-01-31 DIAGNOSIS — F411 Generalized anxiety disorder: Secondary | ICD-10-CM

## 2012-01-31 DIAGNOSIS — I495 Sick sinus syndrome: Secondary | ICD-10-CM | POA: Insufficient documentation

## 2012-01-31 DIAGNOSIS — I517 Cardiomegaly: Secondary | ICD-10-CM | POA: Insufficient documentation

## 2012-01-31 DIAGNOSIS — E119 Type 2 diabetes mellitus without complications: Secondary | ICD-10-CM | POA: Insufficient documentation

## 2012-01-31 DIAGNOSIS — R079 Chest pain, unspecified: Principal | ICD-10-CM | POA: Insufficient documentation

## 2012-01-31 DIAGNOSIS — R0602 Shortness of breath: Secondary | ICD-10-CM

## 2012-01-31 DIAGNOSIS — I4891 Unspecified atrial fibrillation: Secondary | ICD-10-CM | POA: Insufficient documentation

## 2012-01-31 DIAGNOSIS — I10 Essential (primary) hypertension: Secondary | ICD-10-CM

## 2012-01-31 DIAGNOSIS — I428 Other cardiomyopathies: Secondary | ICD-10-CM | POA: Insufficient documentation

## 2012-01-31 DIAGNOSIS — R062 Wheezing: Secondary | ICD-10-CM | POA: Insufficient documentation

## 2012-01-31 DIAGNOSIS — K219 Gastro-esophageal reflux disease without esophagitis: Secondary | ICD-10-CM | POA: Insufficient documentation

## 2012-01-31 DIAGNOSIS — E7800 Pure hypercholesterolemia, unspecified: Secondary | ICD-10-CM

## 2012-01-31 HISTORY — DX: Presence of cardiac pacemaker: Z95.0

## 2012-01-31 HISTORY — DX: Major depressive disorder, single episode, unspecified: F32.9

## 2012-01-31 HISTORY — DX: Depression, unspecified: F32.A

## 2012-01-31 HISTORY — DX: Takotsubo syndrome: I51.81

## 2012-01-31 HISTORY — DX: Unspecified osteoarthritis, unspecified site: M19.90

## 2012-01-31 HISTORY — DX: Essential (primary) hypertension: I10

## 2012-01-31 HISTORY — DX: Acute myocardial infarction, unspecified: I21.9

## 2012-01-31 HISTORY — DX: Atherosclerotic heart disease of native coronary artery without angina pectoris: I25.10

## 2012-01-31 LAB — DIFFERENTIAL
Basophils Relative: 0 % (ref 0–1)
Lymphocytes Relative: 20 % (ref 12–46)
Lymphs Abs: 2.4 10*3/uL (ref 0.7–4.0)
Monocytes Absolute: 0.8 10*3/uL (ref 0.1–1.0)
Monocytes Relative: 7 % (ref 3–12)
Neutro Abs: 8.8 10*3/uL — ABNORMAL HIGH (ref 1.7–7.7)

## 2012-01-31 LAB — CBC
Platelets: 343 10*3/uL (ref 150–400)
RBC: 3.89 MIL/uL (ref 3.87–5.11)
RDW: 13.8 % (ref 11.5–15.5)
WBC: 12.2 10*3/uL — ABNORMAL HIGH (ref 4.0–10.5)

## 2012-01-31 LAB — BASIC METABOLIC PANEL
CO2: 25 mEq/L (ref 19–32)
Chloride: 101 mEq/L (ref 96–112)
Creatinine, Ser: 0.98 mg/dL (ref 0.50–1.10)
GFR calc Af Amer: 64 mL/min — ABNORMAL LOW (ref >90)
Potassium: 4.1 mEq/L (ref 3.5–5.1)
Sodium: 139 mEq/L (ref 135–145)

## 2012-01-31 LAB — POCT I-STAT TROPONIN I: Troponin i, poc: 0.01 ng/mL (ref 0.00–0.08)

## 2012-01-31 LAB — CARDIAC PANEL(CRET KIN+CKTOT+MB+TROPI): Troponin I: <0.3 ng/mL (ref <0.30)

## 2012-01-31 LAB — PROTIME-INR
INR: 2.02 — ABNORMAL HIGH (ref 0.00–1.49)
Prothrombin Time: 23.2 seconds — ABNORMAL HIGH (ref 11.6–15.2)

## 2012-01-31 LAB — PRO B NATRIURETIC PEPTIDE: Pro B Natriuretic peptide (BNP): 1944 pg/mL — ABNORMAL HIGH (ref 0–450)

## 2012-01-31 LAB — GLUCOSE, CAPILLARY: Glucose-Capillary: 164 mg/dL — ABNORMAL HIGH (ref 70–99)

## 2012-01-31 MED ORDER — PAROXETINE HCL 20 MG PO TABS
20.0000 mg | ORAL_TABLET | Freq: Every day | ORAL | Status: DC
  Administered 2012-02-01: 20 mg via ORAL
  Filled 2012-01-31: qty 1

## 2012-01-31 MED ORDER — BENAZEPRIL HCL 10 MG PO TABS
10.0000 mg | ORAL_TABLET | Freq: Every day | ORAL | Status: DC
  Administered 2012-02-01: 10 mg via ORAL
  Filled 2012-01-31: qty 1

## 2012-01-31 MED ORDER — NITROGLYCERIN 0.4 MG SL SUBL
0.4000 mg | SUBLINGUAL_TABLET | SUBLINGUAL | Status: DC | PRN
  Filled 2012-01-31: qty 25

## 2012-01-31 MED ORDER — ASPIRIN 325 MG PO TABS
325.0000 mg | ORAL_TABLET | ORAL | Status: AC
  Administered 2012-01-31: 325 mg via ORAL
  Filled 2012-01-31: qty 1

## 2012-01-31 MED ORDER — INSULIN ASPART 100 UNIT/ML ~~LOC~~ SOLN
0.0000 [IU] | Freq: Three times a day (TID) | SUBCUTANEOUS | Status: DC
  Administered 2012-02-01 (×3): 3 [IU] via SUBCUTANEOUS

## 2012-01-31 MED ORDER — FUROSEMIDE 40 MG PO TABS
40.0000 mg | ORAL_TABLET | Freq: Two times a day (BID) | ORAL | Status: DC
  Administered 2012-01-31 – 2012-02-01 (×2): 40 mg via ORAL
  Filled 2012-01-31 (×3): qty 1

## 2012-01-31 MED ORDER — ACETAMINOPHEN 325 MG PO TABS: 650.0000 mg | ORAL_TABLET | ORAL | Status: DC | PRN

## 2012-01-31 MED ORDER — FUROSEMIDE 10 MG/ML IJ SOLN
80.0000 mg | Freq: Once | INTRAMUSCULAR | Status: AC
  Administered 2012-01-31: 80 mg via INTRAVENOUS
  Filled 2012-01-31: qty 8

## 2012-01-31 MED ORDER — WARFARIN SODIUM 4 MG PO TABS
4.0000 mg | ORAL_TABLET | Freq: Once | ORAL | Status: AC
  Administered 2012-01-31: 4 mg via ORAL
  Filled 2012-01-31 (×2): qty 1

## 2012-01-31 MED ORDER — ONDANSETRON HCL 4 MG/2ML IJ SOLN: 4.0000 mg | Freq: Four times a day (QID) | INTRAMUSCULAR | Status: DC | PRN

## 2012-01-31 MED ORDER — SODIUM CHLORIDE 0.9 % IJ SOLN
3.0000 mL | Freq: Two times a day (BID) | INTRAMUSCULAR | Status: DC
  Administered 2012-01-31 – 2012-02-01 (×2): 3 mL via INTRAVENOUS

## 2012-01-31 MED ORDER — SPIRONOLACTONE 12.5 MG HALF TABLET
12.5000 mg | ORAL_TABLET | Freq: Every day | ORAL | Status: DC
  Administered 2012-02-01: 12.5 mg via ORAL
  Filled 2012-01-31: qty 1

## 2012-01-31 MED ORDER — SODIUM CHLORIDE 0.9 % IJ SOLN: 3.0000 mL | INTRAMUSCULAR | Status: DC | PRN

## 2012-01-31 MED ORDER — METFORMIN HCL 500 MG PO TABS
500.0000 mg | ORAL_TABLET | Freq: Two times a day (BID) | ORAL | Status: DC
  Administered 2012-02-01 (×2): 500 mg via ORAL
  Filled 2012-01-31 (×3): qty 1

## 2012-01-31 MED ORDER — OFLOXACIN 0.3 % OT SOLN
10.0000 [drp] | Freq: Two times a day (BID) | OTIC | Status: DC
  Filled 2012-01-31 (×2): qty 5

## 2012-01-31 MED ORDER — ALPRAZOLAM 0.25 MG PO TABS
0.2500 mg | ORAL_TABLET | Freq: Two times a day (BID) | ORAL | Status: DC
  Administered 2012-01-31 – 2012-02-01 (×2): 0.25 mg via ORAL
  Filled 2012-01-31 (×2): qty 1

## 2012-01-31 MED ORDER — OFLOXACIN 0.3 % OP SOLN
10.0000 [drp] | Freq: Two times a day (BID) | OPHTHALMIC | Status: DC
  Administered 2012-02-01 (×2): 10 [drp] via OTIC
  Filled 2012-01-31: qty 5

## 2012-01-31 MED ORDER — WARFARIN - PHARMACIST DOSING INPATIENT: Freq: Every day | Status: DC

## 2012-01-31 MED ORDER — ZOLPIDEM TARTRATE 5 MG PO TABS: 5.0000 mg | ORAL_TABLET | Freq: Every evening | ORAL | Status: DC | PRN

## 2012-01-31 MED ORDER — METOPROLOL SUCCINATE ER 50 MG PO TB24
50.0000 mg | ORAL_TABLET | Freq: Every day | ORAL | Status: DC
  Administered 2012-02-01: 50 mg via ORAL
  Filled 2012-01-31: qty 1

## 2012-01-31 MED ORDER — SODIUM CHLORIDE 0.9 % IV SOLN: 250.0000 mL | INTRAVENOUS | Status: DC | PRN

## 2012-01-31 MED ORDER — MEMANTINE HCL 10 MG PO TABS
10.0000 mg | ORAL_TABLET | Freq: Two times a day (BID) | ORAL | Status: DC
  Administered 2012-01-31 – 2012-02-01 (×2): 10 mg via ORAL
  Filled 2012-01-31 (×3): qty 1

## 2012-01-31 MED ORDER — GLIPIZIDE-METFORMIN HCL 5-500 MG PO TABS: 1.0000 | ORAL_TABLET | Freq: Two times a day (BID) | ORAL | Status: DC

## 2012-01-31 MED ORDER — GLIPIZIDE 5 MG PO TABS
5.0000 mg | ORAL_TABLET | Freq: Two times a day (BID) | ORAL | Status: DC
  Administered 2012-02-01: 5 mg via ORAL
  Filled 2012-01-31 (×3): qty 1

## 2012-01-31 NOTE — ED Notes (Signed)
[id number] Ready

## 2012-01-31 NOTE — ED Provider Notes (Signed)
History     CSN: [id number]  Arrival date & time 01/31/12  1035   First MD Initiated Contact with Patient 01/31/12 1141      Chief Complaint  Patient presents with  . Shortness of Breath  . Chest Pain     HPI Patient complains of wheezing and sob, chest pain since last night. She states she is having pain into her left shoulder as well  Past Medical History  Diagnosis Date  . HYPERCHOLESTEROLEMIA   . ANXIETY   . Takotsubo cardiomyopathy     2006  . CARDIOMYOPATHY     Nonischemic; nonobstructive CAD, EF 35-40% by cath 07/2010, EF 25% by echo 07/2010  . Atrial fibrillation     on coumadin  . SICK SINUS SYNDROME     s/p Medtronic PPM '04, generator change '12  . CHF   . GERD   . Diabetes mellitus     Past Surgical History  Procedure Date  . Coronary angioplasty   . Pacemaker insertion   . Cervical spine surgery   . Back surgery   . Radical hysterectomy     Family History  Problem Relation Age of Onset  . Other      no known CAD    History  Substance Use Topics  . Smoking status: Never Smoker   . Smokeless tobacco: Not on file  . Alcohol Use: No    OB History    Grav Para Term Preterm Abortions TAB SAB Ect Mult Living                  Review of Systems  Allergies  Review of patient's allergies indicates no known allergies.  Home Medications   No current outpatient prescriptions on file.  BP 120/72  Pulse 70  Temp(Src) 98.5 F (36.9 C) (Oral)  Resp 18  SpO2 96%  Physical Exam  Nursing note and vitals reviewed. Constitutional: She is oriented to person, place, and time. She appears well-developed and well-nourished. No distress.  HENT:  Head: Normocephalic and atraumatic.  Eyes: Pupils are equal, round, and reactive to light.  Neck: Normal range of motion.  Cardiovascular: Normal rate and intact distal pulses.        AFIB/FLUTTER AND VENTRICULAR-PACED RHYTHM ~ V-paced rhythm, rate equals 64 NO FURTHER ANALYSIS ATTEMPTED DUE TO PACED  RHYTHM RECONSTRUCTED PACER SPIKES IN LD(S) II,aVR,V1 ~ bedside recording Abnormal ECG No significant change when compared to previous tracings  Pulmonary/Chest: No respiratory distress.  Abdominal: Normal appearance. She exhibits no distension.  Musculoskeletal: Normal range of motion.  Neurological: She is alert and oriented to person, place, and time. No cranial nerve deficit.  Skin: Skin is warm and dry. No rash noted.  Psychiatric: She has a normal mood and affect. Her behavior is normal.    ED Course  Procedures (including critical care time) Scheduled Meds:   . ALPRAZolam  0.25 mg Oral BID  . aspirin  325 mg Oral STAT  . benazepril  10 mg Oral Daily  . furosemide  80 mg Intravenous Once  . furosemide  40 mg Oral BID  . glipiZIDE  5 mg Oral BID AC  . insulin aspart  0-15 Units Subcutaneous TID WC  . memantine  10 mg Oral BID  . metFORMIN  500 mg Oral BID WC  . metoprolol succinate  50 mg Oral Daily  . ofloxacin  10 drop Right Ear BID  . PARoxetine  20 mg Oral Daily  . sodium chloride  3  mL Intravenous Q12H  . spironolactone  12.5 mg Oral Daily  . warfarin  4 mg Oral ONCE-1800  . Warfarin - Pharmacist Dosing Inpatient   Does not apply q1800  . DISCONTD: glipiZIDE-metformin  1 tablet Oral BID AC   Continuous Infusions:  PRN Meds:.sodium chloride, acetaminophen, nitroGLYCERIN, ondansetron (ZOFRAN) IV, sodium chloride, zolpidem  Labs Reviewed  CBC - Abnormal; Notable for the following:    WBC 12.2 (*)    All other components within normal limits  BASIC METABOLIC PANEL - Abnormal; Notable for the following:    Glucose, Bld 183 (*)    GFR calc non Af Amer 55 (*)    GFR calc Af Amer 64 (*)    All other components within normal limits  PRO B NATRIURETIC PEPTIDE - Abnormal; Notable for the following:    Pro B Natriuretic peptide (BNP) 1944.0 (*)    All other components within normal limits  PROTIME-INR - Abnormal; Notable for the following:    Prothrombin Time 23.2 (*)      INR 2.02 (*)    All other components within normal limits  GLUCOSE, CAPILLARY - Abnormal; Notable for the following:    Glucose-Capillary 162 (*)    All other components within normal limits  GLUCOSE, CAPILLARY - Abnormal; Notable for the following:    Glucose-Capillary 164 (*)    All other components within normal limits  DIFFERENTIAL - Abnormal; Notable for the following:    Neutro Abs 8.8 (*)    All other components within normal limits  POCT I-STAT TROPONIN I  PROTIME-INR  CARDIAC PANEL(CRET KIN+CKTOT+MB+TROPI)  CARDIAC PANEL(CRET KIN+CKTOT+MB+TROPI)  CARDIAC PANEL(CRET KIN+CKTOT+MB+TROPI)  CBC  BASIC METABOLIC PANEL  URINALYSIS, ROUTINE W REFLEX MICROSCOPIC   Dg Chest 2 View  01/31/2012  *RADIOLOGY REPORT*  Clinical Data: 77 year old female with chest pain, shortness of breath, altered mental status.  CHEST - 2 VIEW  Comparison: 06/16/2011 and earlier.  Findings: Stable lung volumes.  Stable left chest cardiac pacemaker.  Stable cardiomegaly with left atrial enlargement. Other mediastinal contours are within normal limits.  Local ACDF hardware.  No pneumothorax.  No pleural effusion.  Increased basilar predominant vascular congestion without overt edema.  No consolidation.  IMPRESSION: Increased basilar predominant vascular congestion.  No other acute cardiopulmonary abnormality.  Original Report Authenticated By: Ulla Potash III, M.D.     1. Congestive heart failure   2. Shortness of breath   3. HYPERCHOLESTEROLEMIA   4. ANXIETY   5. CAD       MDM         Nelia Shi, MD 01/31/12 2034

## 2012-01-31 NOTE — Progress Notes (Signed)
ANTICOAGULATION CONSULT NOTE - Initial Consult  Pharmacy Consult for Coumadin Indication: atrial fibrillation  No Known Allergies  Patient Measurements:   Ht: 66 inches Wt: ~95 kg  Vital Signs: Temp: 98.7 F (37.1 C) (04/23 1556) Temp src: Oral (04/23 1556) BP: 117/75 mmHg (04/23 1556) Pulse Rate: 60  (04/23 1556)  Labs:  Basename 01/31/12 1145 01/31/12 1050  HGB -- 12.2  HCT -- 36.1  PLT -- 343  APTT -- --  LABPROT 23.2* --  INR 2.02* --  HEPARINUNFRC -- --  CREATININE -- 0.98  CKTOTAL -- --  CKMB -- --  TROPONINI -- --   Estimated Creat Clearance: 55 ml/min  Medical History: Past Medical History  Diagnosis Date  . HYPERCHOLESTEROLEMIA   . ANXIETY   . Takotsubo cardiomyopathy     2006  . CARDIOMYOPATHY     Nonischemic; nonobstructive CAD, EF 35-40% by cath 07/2010, EF 25% by echo 07/2010  . Atrial fibrillation     on coumadin  . SICK SINUS SYNDROME     s/p Medtronic PPM '04, generator change '12  . CHF   . GERD   . Diabetes mellitus     Medications:  See electronic home med rec. Coumadin 2 mg M/W/F and 4 mg T/T/S/S.  Assessment: 77 y.o. female presents with wheezing and chest pain. On Coumadin PTA for afib. Baseline INR 2.02. No bleeding noted. CBC stable.  Goal of Therapy:  INR 2-3   Plan:  1. Daily INR 2. Coumadin 4mg  po today  Christoper Fabian, PharmD, BCPS Clinical pharmacist, pager [phone] 01/31/2012,4:23 PM

## 2012-01-31 NOTE — ED Notes (Signed)
Patient complains of wheezing and sob, chest pain since last night.  She states she is having pain into her left shoulder as well

## 2012-01-31 NOTE — ED Notes (Signed)
Pt is eating peanut butter graham crackers and drinking water talking to Cardiology at this time

## 2012-01-31 NOTE — ED Notes (Signed)
Attempted to call report - nurse unavailable to take at this time - will call back in 15 min

## 2012-01-31 NOTE — H&P (Signed)
History and Physical  Patient ID: [name] MRN: [id number], SOB: 17-Jul-1935 77 y.o. Date of Encounter: 01/31/2012, 3:27 PM  Primary Physician: Minda Meo, MD Primary Cardiologist: Dr. Trudee Kuster  Chief Complaint: wheezing and chest pain  HPI: 77 y.o. female w/ PMHx significant for nonobstructive CAD by cath '11, Takatsubo '06, NICM, Atrial Fibrillation (on coumadin), sick sinus syndrome (s/p PPM '04), HLD, HTN, DMII, and anxiety who presented to [facility] on 01/31/2012 with complaints of wheezing and chest pain.  Last cath 07/2010 with nonobstructive CAD, EF 35-40%. Echo 07/2010 estimated EF 25%. She is a poor historian and answers most questions "I dont know". She reports having some wheezing yesterday and an onset of chest discomfort in the middle of her chest that radiated across her chest. Unable to quantify or qualify the pain. No associated symptoms. Reports the chest pain was intermittent through this morning. Denies sob, orthopnea, worsening edema, cough, dysuria, fever, chills, change in bladder or bowels. She stated it is probably just "my nerves". Her daughters at the bedside stated she is always worrying about her health and "she is scared to live and scared to die". They reported her home of 40 yrs was sold yesterday and they think that is what brought on this episode of chest pain and wheezing. They also noted that she had a "punctured eardrum" about 1 mo ago after sticking a bobbypin in her ear and she has still been complaining of ear pain.  In the ED, EKG revealed Atrial fibrillation, V-Paced 64bpm unchanged from prior EKG. CXR showed increased basilar predominant vascular congestion. Labs significant for normal poc troponin, pBNP 1944, glucose 183, INR 2.02, WBC 12.2. Temp 99.3. Received 325mg  ASA, 80mg  IV Lasix.   Past Medical History  Diagnosis Date  . HYPERCHOLESTEROLEMIA   . ANXIETY   . Takotsubo cardiomyopathy     2006  . CARDIOMYOPATHY    Nonischemic; nonobstructive CAD, EF 35-40% by cath 07/2010, EF 25% by echo 07/2010  . Atrial fibrillation     on coumadin  . SICK SINUS SYNDROME     s/p PPM  . CHF   . GERD   . Diabetes mellitus     R/L Cardiac Cath - 2011 Left main coronary artery was normal. Left anterior descending artery was normal in its proximal portion.There was a 40% calcified lesion just after the first septal perforator and distal vessel had 40% tubular disease. The first and second diagonal branches were normal. Circumflex coronary artery was nondominant and normal.  There were 2 large obtuse marginal branches which were normal. Right coronary artery was dominant.  It was normal in its proximal mid and distal location.  PDA was somewhat small and normal.  Right heart catheterization was done for congestive heart failure. Aortic pressure was 100/50, LV pressure was 100/8, mean right atrial pressure was 13, RV pressure was 41/8, PA pressure was 41/23, and pulmonary capillary wedge pressure was 19.  Cardiac output was 3.4 L per minute.  RAO ventriculography.  RAO ventriculography showed mid anterior wall, distal anterior wall, apical inferior, apical wall hypokinesis.  The EF was 35-40%.  Basal function was hyperdynamic.  There was no significant MR.  IMPRESSION:  The patient would appear to have nonischemic cardiomyopathy.   2D Echocardiogram - 2011 Study Conclusions: - Left ventricle: LVEF is approxmately 25 % with akinesis of the inferoseptum, md/distal inferior, mid/distal anteroseptum, distal postetiro distal anterior, apical walls. - Mitral valve: Mild regurgitation. - Right ventricle: RVEF is moderately depressed  with akinesis of the mid and distal walls. - Tricuspid valve: Moderate regurgitation. - Pulmonary arteries: PA peak pressure: 60mm Hg (S). - Pericardium, extracardiac: A trivial pericardial effusion was identified.  Surgical History:  Past Surgical History  Procedure Date  . Coronary angioplasty    . Pacemaker insertion   . Cervical spine surgery   . Back surgery   . Radical hysterectomy      Home Meds: Medication Sig  ALPRAZolam (XANAX) 0.25 MG tablet Take 1 tablet (0.25 mg total) by mouth 2 (two) times daily.  benazepril (LOTENSIN) 20 MG tablet Take 10 mg by mouth daily.   furosemide (LASIX) 40 MG tablet Take 1 tablet (40 mg total) by mouth 2 (two) times daily.  glipiZIDE-metformin (METAGLIP) 5-500 MG per tablet Take 1 tablet by mouth 2 (two) times daily before a meal.    memantine (NAMENDA) 10 MG tablet Take 10 mg by mouth 2 (two) times daily.   metoprolol (TOPROL-XL) 50 MG 24 hr tablet Take 1 tablet (50 mg total) by mouth daily.  PARoxetine (PAXIL) 20 MG tablet Take 20 mg by mouth.    spironolactone (ALDACTONE) 25 MG tablet Take 0.5 tablets (12.5 mg total) by mouth daily.  warfarin (COUMADIN) 4 MG tablet Take 2-4 mg by mouth every evening. 1/2 tab Monday, Wednesday, and Friday then 1 tab all other days  nitroGLYCERIN (NITROSTAT) 0.4 MG SL tablet Place 0.4 mg under the tongue every 5 (five) minutes as needed. For chest pain    Allergies: No Known Allergies   Social History  . Marital Status: Widowed   Occupational History  . Retired   Social History Main Topics  . Smoking status: Never Smoker   . Alcohol Use: No  . Drug Use: No   Social History Narrative  . Lives at [facility]     Family History  Problem Relation Age of Onset  . Other      no known CAD    Review of Systems: General: negative for chills, fever, night sweats or weight changes.  Cardiovascular: As per HPI Dermatological: negative for rash Respiratory: negative for cough or wheezing Urologic: negative for hematuria Abdominal: negative for nausea, vomiting, diarrhea, bright red blood per rectum, melena, or hematemesis Neurologic: negative for visual changes, syncope, or dizziness All other systems reviewed and are otherwise negative except as noted above.  Labs:     Component Value Date   WBC 12.2* 01/31/2012   HGB 12.2 01/31/2012   HCT 36.1 01/31/2012   MCV 92.8 01/31/2012   PLT 343 01/31/2012     01/31/2012 11:45  Prothrombin Time 23.2 (H)  INR 2.02 (H)   Lab 01/31/12 1050  NA 139  K 4.1  CL 101  CO2 25  BUN 15  CREATININE 0.98  CALCIUM 9.3  GLUCOSE 183*     01/31/2012 10:50 01/31/2012 11:15  Troponin i, poc  0.01  Pro B Natriuretic peptide (BNP) 1944.0 (H)     Radiology/Studies:   01/31/2012 - CXR Findings: Stable lung volumes.  Stable left chest cardiac pacemaker.  Stable cardiomegaly with left atrial enlargement. Other mediastinal contours are within normal limits.  Local ACDF hardware.  No pneumothorax.  No pleural effusion.  Increased basilar predominant vascular congestion without overt edema.  No consolidation.  IMPRESSION: Increased basilar predominant vascular congestion.  No other acute cardiopulmonary abnormality.      EKG: Atrial fibrillation, V-Paced 64bpm  Physical Exam: Blood pressure 140/69, pulse 64, temperature 99 F (37.2 C), temperature source  Oral, resp. rate 28, SpO2 99.00%. General: Overweight elderly female in no acute distress. Head: Right tympanic membrane cloudy with small area of blood. Normocephalic, atraumatic, sclera non-icteric, nares are without discharge Neck: Supple. Negative for carotid bruits. JVD not elevated. Lungs: Clear bilaterally to auscultation without wheezes, rales, or rhonchi. Breathing is unlabored. Heart: RRR with S1 S2. No murmurs, rubs, or gallops appreciated. Abdomen: Obese, Soft, non-tender, non-distended with normoactive bowel sounds. No rebound/guarding. No obvious abdominal masses. Msk:  Strength and tone appear normal for age. Extremities: Trace BLE edema. No clubbing or cyanosis. Distal pedal pulses are 2+ and equal bilaterally. Neuro: Alert and oriented X 3. Moves all extremities spontaneously. Psych:  Responds to questions appropriately with a normal affect.    ASSESSMENT AND  PLAN:   77 y.o. female w/ PMHx significant for nonobstructive CAD by cath '11, Takatsubo '06, NICM, Atrial Fibrillation (on coumadin), sick sinus syndrome (s/p PPM '04), HLD, HTN, DMII, and anxiety who presented to [facility] on 01/31/2012 with complaints of wheezing and chest pain.  1. Chest pain 2. Wheezing  3. NICM EF 25% by echo 07/2010 4. Atrial Fibrillation/Sick Sinus Syndrome s/p [employer] PPM  Minimal nonobstructive CAD by cath 07/2010. EKG w/o acute changes. Initial poc troponin normal. Atypical chest pain. Do not suspect ACS. Likely related to volume status. Last EF 25% by echo and 35-40% by cath in 2011. Received IV Lasix prior to my exam. She is not wheezing, lungs are clear, and she does not appear volume overloaded, however, pBNP elevated and CXR with increased vascular congestion. Also with low grade temp 99.3 and elevated WBC 12.2. ? Infection, ? R/t right tympanic membrane perforation. Add on Diff to CBC, check UA. Will admit for further evaluation and treatment. Place on home oral lasix dosing. Cycle enzymes. Give abx ear drops. A.Fib rate controlled. Cont coumadin. Patient would like her [employer] PPM interrogated while in the hospital so she doesn't have to come back to the office in May for scheduled interrogation. Will notify [employer] rep.   Signed, HOPE, JESSICA PA-C 01/31/2012, 3:27 PM  History reviewed with the patient, no changes to be made. The patient is having atypical chest pain as described.  This is similar to her pervious pain at the time of her last cath.  There is no objective evidence of ischemia.  She does have some evidence for mild volume overload.  She is quite anxious.  The patient exam reveals  Lungs:  Clear, COR RRR, Abd Positive bowel sounds, no rebound no guarding, Ext No edema.  All available labs, radiology testing, previous records reviewed. Agree with documented assessment and plan.  She can be ruled out. If enzymes negative no further work up.   She was given IV Lasix in the ER.  Otherwise continue previous regimine.  If no objective evidence of ischemia overgnight she can be discharged to home.  Fayrene Fearing Hawkin Charo  4:17 PM 08/26/2011

## 2012-01-31 NOTE — ED Notes (Signed)
Pt. Ate 100 % of lunch.  Pt. Is in no distress, no sob. Noted. Pt. Denies any chest pain or pressure

## 2012-02-01 ENCOUNTER — Encounter (HOSPITAL_COMMUNITY): Payer: Self-pay | Admitting: General Practice

## 2012-02-01 DIAGNOSIS — I10 Essential (primary) hypertension: Secondary | ICD-10-CM

## 2012-02-01 DIAGNOSIS — I4891 Unspecified atrial fibrillation: Secondary | ICD-10-CM

## 2012-02-01 DIAGNOSIS — R079 Chest pain, unspecified: Secondary | ICD-10-CM

## 2012-02-01 DIAGNOSIS — E119 Type 2 diabetes mellitus without complications: Secondary | ICD-10-CM

## 2012-02-01 LAB — CARDIAC PANEL(CRET KIN+CKTOT+MB+TROPI)
CK, MB: 1.1 ng/mL (ref 0.3–4.0)
CK, MB: 1.6 ng/mL (ref 0.3–4.0)
Troponin I: <0.3 ng/mL (ref <0.30)

## 2012-02-01 LAB — CBC
HCT: 34.6 % — ABNORMAL LOW (ref 36.0–46.0)
Hemoglobin: 11.7 g/dL — ABNORMAL LOW (ref 12.0–15.0)
MCH: 31.2 pg (ref 26.0–34.0)
MCHC: 33.8 g/dL (ref 30.0–36.0)
MCV: 92.3 fL (ref 78.0–100.0)

## 2012-02-01 LAB — BASIC METABOLIC PANEL
BUN: 18 mg/dL (ref 6–23)
Calcium: 9.5 mg/dL (ref 8.4–10.5)
Creatinine, Ser: 1.04 mg/dL (ref 0.50–1.10)
GFR calc non Af Amer: 51 mL/min — ABNORMAL LOW (ref >90)
Glucose, Bld: 205 mg/dL — ABNORMAL HIGH (ref 70–99)

## 2012-02-01 LAB — GLUCOSE, CAPILLARY: Glucose-Capillary: 156 mg/dL — ABNORMAL HIGH (ref 70–99)

## 2012-02-01 LAB — PROTIME-INR: INR: 2.06 — ABNORMAL HIGH (ref 0.00–1.49)

## 2012-02-01 MED ORDER — WARFARIN SODIUM 4 MG PO TABS: 4.0000 mg | ORAL_TABLET | ORAL | Status: DC

## 2012-02-01 MED ORDER — WARFARIN SODIUM 2 MG PO TABS
2.0000 mg | ORAL_TABLET | ORAL | Status: DC
  Administered 2012-02-01: 2 mg via ORAL
  Filled 2012-02-01: qty 1

## 2012-02-01 MED ORDER — OFLOXACIN 0.3 % OP SOLN: 10.0000 [drp] | Freq: Two times a day (BID) | OPHTHALMIC | Status: DC

## 2012-02-01 MED ORDER — OFLOXACIN 0.3 % OP SOLN: 10.0000 [drp] | Freq: Two times a day (BID) | OPHTHALMIC | Status: AC

## 2012-02-01 NOTE — Discharge Summary (Signed)
Discharge Summary   Patient ID: [name],  MRN: [id number], DOB/AGE: 06-03-1935 77 y.o.  Admit date: 01/31/2012 Discharge date: 02/01/2012  Discharge Diagnoses Principal Problem:  *Chest pain Active Problems:  CARDIOMYOPATHY  SICK SINUS SYNDROME  Hypertension  Type 2 diabetes mellitus   Allergies No Known Allergies  Diagnostic Studies/Procedures  CHEST - 2 VIEW- 01/31/12  Comparison: 06/16/2011 and earlier.  Findings: Stable lung volumes. Stable left chest cardiac  pacemaker. Stable cardiomegaly with left atrial enlargement. Other  mediastinal contours are within normal limits. Local ACDF  hardware. No pneumothorax. No pleural effusion. Increased  basilar predominant vascular congestion without overt edema. No  consolidation.  IMPRESSION:  Increased basilar predominant vascular congestion. No other acute  cardiopulmonary abnormality.  History of Present Illness  Ms. Kruser is a 77 yo Caucasian female with PMHx significant for CAD (nonobsructive CAD by cath 2011; EF 35-40%), Takatsubo CM (2006), NICM (EF 25% per 10/11 echo), atrial fibrillation (on Coumadin), SSS (s/p PPM 2004), HTN, HL, type 2 DM and anxiety who was admitted to [facility] on 01/31/12 with atypical chest pain and ACS rule out.  She reported experiencing some wheezing yesterday with associated chest discomfort located in the middle of her chest with L-sided radiation, intermittent, which she attributed to her "nerves." Her home of 40 years was apparently sold the day prior to admission. Additionally, she reports a chronic ear ache for the past several weeks attributed to trauma after she punctured her TM with a bobbypin. This was evaluated at an outside ED.   In the ED, EKG revealed V-paced rhythm with underlying atrial fibrillation unchanged from prior tracings. CXR revealed increased bibasilar vascular congestion. POC TnI WNL. pBNP elevated. A mild leukocytosis was found, however she was  afebrile. She received full-dose ASA and Lasix 80mg  IV x 1. Her chest pain was felt to be atypical and without objective evidence of ischemia. However, given her cardiac history and risk factors, she was subsequently admitted for ACS rule out.   Hospital Course  She was continued on all outpatient medications. Additionally, SSI was ordered for added coverage on her admission.   The following day, she reported an episode of chest pain in the morning rated at a 7/10, related to her presenting chest pain. Cardiac enzymes were cycled and returned WNL x 3. EKG was ordered revealing nonspecific changes, and were consistent with prior tracings. She had a modest diuresis, and remained stable throughout the day. She denied shortness of breath, and there was no evidence of volume overload on exam.   She was felt to be stable for discharge by Dr. Clifton James, with close follow-up by her PCP and Dr. [name]. She will attend previously scheduled Coumadin clinic appointments as well. She will resume all prior outpatient medications. Of note, ofloxacin ophthalmic solution was ordered in the ED and continued on admission with intended use as an otic antibiotic to be applied to the R ear given the patient's history of TM puncture. Upon calling pharmacy, they stated that given this formulation, it could still be applied as an otic solution. This will be continued on discharge.   Discharge Vitals:  Blood pressure 124/45, pulse 59, temperature 98.3 F (36.8 C), temperature source Oral, resp. rate 20, weight 98.3 kg (216 lb 11.4 oz), SpO2 98.00%.   Labs: Recent Labs  Basename 02/01/12 0705 01/31/12 1050   WBC 10.3 12.2*   HGB 11.7* 12.2   HCT 34.6* 36.1   MCV 92.3 92.8   PLT 293 343  Lab 02/01/12 0705 01/31/12 1050  NA 137 139  K 3.6 4.1  CL 97 101  CO2 27 25  BUN 18 15  CREATININE 1.04 0.98  CALCIUM 9.5 9.3  PROT -- --  BILITOT -- --  ALKPHOS -- --  ALT -- --  AST -- --  AMYLASE -- --  LIPASE -- --    GLUCOSE 205* 183*   Recent Labs  Basename 02/01/12 0705 02/01/12 0110 01/31/12 1946   CKTOTAL 74 76 71   CKMB 1.6 1.1 1.7   CKMBINDEX -- -- --   TROPONINI <0.30 <0.30 <0.30   Disposition:  Discharge Orders    Future Appointments: Provider: Department: Dept Phone: Center:   02/24/2012 10:15 AM [facility] [phone] None   02/24/2012 10:30 AM Wendall Stade, MD [facility] [place] [phone] [facility]   03/16/2012 10:15 AM Marinus Maw, MD [facility] [phone] [facility]     Follow-up Information    Follow up with [facility]. (As scheduled. )    Contact information:   [address] [place] [address]       Follow up with ARONSON,RICHARD A, MD. Schedule an appointment as soon as possible for a visit in 1 week.   Contact information:   [address] [place] [place]. [facility] [place] [address] [phone]       Please follow up. (Please attend previously scheduled INR/Coumadin checks. )         Discharge Medications:  Medication List  As of 02/01/2012  6:09 PM   START taking these medications         ofloxacin 0.3 % ophthalmic solution   Commonly known as: OCUFLOX   Place 10 drops into both eyes 2 (two) times daily.         CONTINUE taking these medications         ALPRAZolam 0.25 MG tablet   Commonly known as: XANAX   Take 1 tablet (0.25 mg total) by mouth 2 (two) times daily.      benazepril 20 MG tablet   Commonly known as: LOTENSIN      furosemide 40 MG tablet   Commonly known as: LASIX   Take 1 tablet (40 mg total) by mouth 2 (two) times daily.      glipiZIDE-metformin 5-500 MG per tablet   Commonly known as: METAGLIP      metoprolol succinate 50 MG 24 hr tablet   Commonly known as: TOPROL-XL   Take 1 tablet (50 mg total) by mouth daily.      NAMENDA 10 MG tablet   Generic drug: memantine      nitroGLYCERIN 0.4 MG SL tablet   Commonly  known as: NITROSTAT      PARoxetine 20 MG tablet   Commonly known as: PAXIL      spironolactone 25 MG tablet   Commonly known as: ALDACTONE   Take 0.5 tablets (12.5 mg total) by mouth daily.      warfarin 4 MG tablet   Commonly known as: COUMADIN          Where to get your medications       Information on where to get these meds is not yet available. Ask your nurse or doctor.         ofloxacin 0.3 % ophthalmic solution           Outstanding Labs/Studies: None  Duration of Discharge Encounter: Greater than 30 minutes including physician  time.  Signed, R. Hurman Horn, PA-C 02/01/2012, 6:09 PM

## 2012-02-01 NOTE — Progress Notes (Signed)
Patient's chest pain was relieved with one dose of Nitro. Patient was more calm. Will continue to monitor patient for further changes in condition.

## 2012-02-01 NOTE — Progress Notes (Signed)
Patient complained of chest pain rating it at a 7 out of 10. Pain location is right chest radiating to left side. No complaints of dizziness. STAT 12-lead EKG performed, one nitro given and scheduled xanax was given. BP is 148/55 pulse is 69, no signs or symptoms of SOB. PA notified. No other orders given. Will continue to monitor patient for further changes in condition.

## 2012-02-01 NOTE — Progress Notes (Signed)
Patient Name: [name] Date of Encounter: 02/01/2012     Principal Problem:  *Chest pain Active Problems:  CARDIOMYOPATHY  SICK SINUS SYNDROME  Hypertension  Type 2 diabetes mellitus    SUBJECTIVE: No chest pain today. Wheezing is better.    OBJECTIVE  Filed Vitals:   02/01/12 0550 02/01/12 1020 02/01/12 1028 02/01/12 1436  BP: 137/55 148/55  124/45  Pulse: 63 69  59  Temp: 98.9 F (37.2 C) 98.5 F (36.9 C)  98.3 F (36.8 C)  TempSrc:  Oral  Oral  Resp: 20 20  20   Weight:   98.3 kg (216 lb 11.4 oz)   SpO2: 99% 99%  98%    Intake/Output Summary (Last 24 hours) at 02/01/12 1731 Last data filed at 02/01/12 1640  Gross per 24 hour  Intake   1198 ml  Output   1550 ml  Net   -352 ml   PHYSICAL EXAM  General:  Well developed, well nourished, in no acute distress. Head:  Normocephalic, atraumatic, sclera non-icteric, no xanthomas, nares are without discharge.  Neck:  Supple without JVD. Lungs:  Resp regular and unlabored, CTA. No wheezes Heart:  RRR no s3, s4, or murmurs. Abdomen:  Soft, non-tender, non-distended, BS + x 4.  Msk:   Strength and tone appears normal for age. Extremities:  No clubbing, cyanosis or edema. DP/PT/Radials 2+ and equal bilaterally. Neuro:  Alert and oriented X 3. Moves all extremities spontaneously. Psych: Normal affect.  LABS:  Recent Labs  [facility] 02/01/12 0705 01/31/12 1050   WBC 10.3 12.2*   HGB 11.7* 12.2   HCT 34.6* 36.1   MCV 92.3 92.8   PLT 293 343    Lab 02/01/12 0705 01/31/12 1050  NA 137 139  K 3.6 4.1  CL 97 101  CO2 27 25  BUN 18 15  CREATININE 1.04 0.98  CALCIUM 9.5 9.3  PROT -- --  BILITOT -- --  ALKPHOS -- --  ALT -- --  AST -- --  AMYLASE -- --  LIPASE -- --  GLUCOSE 205* 183*   Recent Labs  Basename 02/01/12 0705 02/01/12 0110 01/31/12 1946   CKTOTAL 74 76 71   CKMB 1.6 1.1 1.7   CKMBINDEX -- -- --   TROPONINI <0.30 <0.30 <0.30    TELE: V-pacing, 70-80 bpm  ECG:  02/01/12 0206:  V-pacing, underlying atrial fibrillation, 70 bpm, TWIs V3-V6 02/01/12 1159: V-pacing, underlying atrial fibrillation, 76 bpm more prominent TWIs V3-V6  Radiology/Studies:  Dg Chest 2 View  01/31/2012  *RADIOLOGY REPORT*  Clinical Data: 77 year old female with chest pain, shortness of breath, altered mental status.  CHEST - 2 VIEW  Comparison: 06/16/2011 and earlier.  Findings: Stable lung volumes.  Stable left chest cardiac pacemaker.  Stable cardiomegaly with left atrial enlargement. Other mediastinal contours are within normal limits.  Local ACDF hardware.  No pneumothorax.  No pleural effusion.  Increased basilar predominant vascular congestion without overt edema.  No consolidation.  IMPRESSION: Increased basilar predominant vascular congestion.  No other acute cardiopulmonary abnormality.  Original Report Authenticated By: Harley Hallmark, M.D.    Current Medications:     . ALPRAZolam  0.25 mg Oral BID  . benazepril  10 mg Oral Daily  . furosemide  40 mg Oral BID  . glipiZIDE  5 mg Oral BID AC  . insulin aspart  0-15 Units Subcutaneous TID WC  . memantine  10 mg Oral BID  . metFORMIN  500 mg Oral BID WC  .  metoprolol succinate  50 mg Oral Daily  . ofloxacin  10 drop Right Ear BID  . PARoxetine  20 mg Oral Daily  . sodium chloride  3 mL Intravenous Q12H  . spironolactone  12.5 mg Oral Daily  . warfarin  2 mg Oral Custom  . warfarin  4 mg Oral ONCE-1800  . warfarin  4 mg Oral Custom  . Warfarin - Pharmacist Dosing Inpatient   Does not apply q1800  . DISCONTD: glipiZIDE-metformin  1 tablet Oral BID AC  . DISCONTD: ofloxacin  10 drop Right Ear BID    ASSESSMENT AND PLAN:  77 yo female with PMHx significant for CAD (nonobsructive CAD by cath 2011; EF 35-40%), Takatsubo CM (2006), NICM (EF 25% per 10/11 echo), atrial fibrillation (on Coumadin), SSS (s/p PPM 2004), HTN, HL, type 2 DM and anxiety who was admitted to [facility] on 01/31/12 with atypical chest pain and ACS rule  out. She was seen last night by Dr. Antoine Poche.   1. Chest pain-  Atypical. Cardiac enzymes negative x 3. She describes pain over her right breast. Worsened with movement.   2. NICM- No assessment of EF since 2011. She will need outpatient f/u with Dr. [name]. Continue medical management.   3. Acute on chronic systolic heart failure: She appears to be euvolemic. Symptoms improved with diuresis. No further wheezing. Continue Lasix 40 mg po BID.   4. SSS- stable; occasional V-pacing on telemetry/EKG this AM.   5. HTN- BP well controlled.   6. Type 2 DM- Continue home regimen.   7. Atrial fibrillation: Continue current meds including coumadin for chronic anticoagulation.   8. Otitis externa: Continue ocuflox drops, 10 drops to right ear BID as written by ED physician. I have not evaluated her ear. F/U with primary MD next week or return to Urgent Care if ear pain worsens.   9. Dispo: will d/c home tonight. Close f/u with primary care is recommended. She will keep her scheduled f/u with Dr. [name] as planned in May.     Ioanna Colquhoun 5:58 PM 02/01/2012

## 2012-02-01 NOTE — Progress Notes (Signed)
ANTICOAGULATION CONSULT NOTE - Follow Up Consult  Pharmacy Consult for coumadin Indication: atrial fibrillation  No Known Allergies   Vital Signs: Temp: 98.9 F (37.2 C) (04/24 0550) Temp src: Oral (04/23 2025) BP: 137/55 mmHg (04/24 0550) Pulse Rate: 63  (04/24 0550)  Labs:  Basename 02/01/12 0705 02/01/12 0110 01/31/12 1946 01/31/12 1145 01/31/12 1050  HGB 11.7* -- -- -- 12.2  HCT 34.6* -- -- -- 36.1  PLT 293 -- -- -- 343  APTT -- -- -- -- --  LABPROT 23.6* -- -- 23.2* --  INR 2.06* -- -- 2.02* --  HEPARINUNFRC -- -- -- -- --  CREATININE 1.04 -- -- -- 0.98  CKTOTAL 74 76 71 -- --  CKMB 1.6 1.1 1.7 -- --  TROPONINI <0.30 <0.30 <0.30 -- --    Medications:  Scheduled:    . ALPRAZolam  0.25 mg Oral BID  . aspirin  325 mg Oral STAT  . benazepril  10 mg Oral Daily  . furosemide  80 mg Intravenous Once  . furosemide  40 mg Oral BID  . glipiZIDE  5 mg Oral BID AC  . insulin aspart  0-15 Units Subcutaneous TID WC  . memantine  10 mg Oral BID  . metFORMIN  500 mg Oral BID WC  . metoprolol succinate  50 mg Oral Daily  . ofloxacin  10 drop Right Ear BID  . PARoxetine  20 mg Oral Daily  . sodium chloride  3 mL Intravenous Q12H  . spironolactone  12.5 mg Oral Daily  . warfarin  4 mg Oral ONCE-1800  . Warfarin - Pharmacist Dosing Inpatient   Does not apply q1800  . DISCONTD: glipiZIDE-metformin  1 tablet Oral BID AC  . DISCONTD: ofloxacin  10 drop Right Ear BID    Assessment: 77 y.o. female presents with wheezing and chest pain. On Coumadin PTA for afib. INR 2.06. No bleeding noted. CBC stable. Coumadin regimen PTA- 2 mg M/W/F and 4 mg T/T/S/S.  Goal of Therapy:  INR 2-3   Plan:  -Will continue coumadin at home regimen -PT/INR MWF only  Benny Lennert PharmD 02/01/2012,8:03 AM

## 2012-02-01 NOTE — Progress Notes (Signed)
DC IV, DC Tele, DC Home. Discharge instructions and home medications discussed with patient and patient's daughter. Patient and family denied any questions or concerns at that time. Patient leaving unit via wheelchair and appear in no acute distress.

## 2012-02-01 NOTE — Discharge Instructions (Signed)
PLEASE REMEMBER TO BRING ALL OF YOUR MEDICATIONS TO EACH OF YOUR FOLLOW-UP OFFICE VISITS.  PLEASE ATTEND ALL PREVIOUSLY SCHEDULED APPOINTMENTS. PLEASE FOLLOW-UP WITH PCP.   PLEASE TAKE ALL MEDICATIONS AS PRESCRIBED.

## 2012-02-02 LAB — GLUCOSE, CAPILLARY: Glucose-Capillary: 186 mg/dL — ABNORMAL HIGH (ref 70–99)

## 2012-02-02 NOTE — Discharge Summary (Signed)
See full note.cdm 

## 2012-02-24 ENCOUNTER — Encounter: Payer: Self-pay | Admitting: Cardiovascular Disease

## 2012-02-24 ENCOUNTER — Ambulatory Visit (INDEPENDENT_AMBULATORY_CARE_PROVIDER_SITE_OTHER): Payer: Medicare Other | Admitting: Cardiovascular Disease

## 2012-02-24 ENCOUNTER — Ambulatory Visit (INDEPENDENT_AMBULATORY_CARE_PROVIDER_SITE_OTHER): Payer: Medicare Other

## 2012-02-24 VITALS — BP 112/63 | HR 75 | Ht 67.0 in | Wt 209.0 lb

## 2012-02-24 DIAGNOSIS — I2510 Atherosclerotic heart disease of native coronary artery without angina pectoris: Secondary | ICD-10-CM

## 2012-02-24 DIAGNOSIS — E7800 Pure hypercholesterolemia, unspecified: Secondary | ICD-10-CM

## 2012-02-24 DIAGNOSIS — I4891 Unspecified atrial fibrillation: Secondary | ICD-10-CM

## 2012-02-24 DIAGNOSIS — I509 Heart failure, unspecified: Secondary | ICD-10-CM

## 2012-02-24 DIAGNOSIS — R609 Edema, unspecified: Secondary | ICD-10-CM

## 2012-02-24 DIAGNOSIS — Z7901 Long term (current) use of anticoagulants: Secondary | ICD-10-CM

## 2012-02-24 LAB — POCT INR: INR: 2.7

## 2012-02-24 NOTE — Assessment & Plan Note (Signed)
R/O recently has not had fixed epicardial disease in past.  Medical Rx

## 2012-02-24 NOTE — Assessment & Plan Note (Addendum)
Vpacing on coumadin no palpitations

## 2012-02-24 NOTE — Assessment & Plan Note (Signed)
From varicose veins elevated legs at end  Of day and diuretic with support hose

## 2012-02-24 NOTE — Assessment & Plan Note (Signed)
Cholesterol is at goal.  Continue current dose of statin and diet Rx.  No myalgias or side effects.  F/U  LFT's in 6 months. No results found for this basename: LDLCALC             

## 2012-02-24 NOTE — Progress Notes (Signed)
Patient ID: [name], female   DOB: [birth year], 77 y.o.   MRN: [id number] Ms. Shannahan is a 77 yo Caucasian female with PMHx significant for CAD (nonobsructive CAD by cath 2011; EF 35-40%), Takatsubo CM (2006), NICM (EF 25% per 10/11 echo), atrial fibrillation (on Coumadin), SSS (s/p PPM 2004), HTN, HL, type 2 DM and anxiety  Recent hospitalization 4/23 for atypical chest pain R/O.  Resides at assisted living now.  Dementia and psychiatric illness makes her hard to care for per daughter.  LE edema from varicose veings.  No  Recurrent pains  ROS: Denies fever, malais, weight loss, blurry vision, decreased visual acuity, cough, sputum, SOB, hemoptysis, pleuritic pain, palpitaitons, heartburn, abdominal pain, melena, lower extremity edema, claudication, or rash.  All other systems reviewed and negative  General: Affect appropriate Healthy:  appears stated age HEENT: normal Neck supple with no adenopathy JVP normal no bruits no thyromegaly Lungs clear with no wheezing and good diaphragmatic motion Heart:  S1/S2 no murmur, no rub, gallop or click PMI normal Abdomen: benighn, BS positve, no tenderness, no AAA no bruit.  No HSM or HJR Distal pulses intact with no bruits Plus 2 edema with varicosities Neuro non-focal Skin warm and dry No muscular weakness   Current Outpatient Prescriptions  Medication Sig Dispense Refill  . ALPRAZolam (XANAX) 0.25 MG tablet Take 1 tablet (0.25 mg total) by mouth 2 (two) times daily.  60 tablet  5  . benazepril (LOTENSIN) 20 MG tablet Take 10 mg by mouth daily.       . furosemide (LASIX) 40 MG tablet Take 1 tablet (40 mg total) by mouth 2 (two) times daily.  60 tablet  10  . glipiZIDE-metformin (METAGLIP) 5-500 MG per tablet Take 1 tablet by mouth 2 (two) times daily before a meal.        . memantine (NAMENDA) 10 MG tablet Take 10 mg by mouth 2 (two) times daily.       . metoprolol (TOPROL-XL) 50 MG 24 hr tablet Take 1 tablet (50 mg total) by mouth daily.  90  tablet  2  . nitroGLYCERIN (NITROSTAT) 0.4 MG SL tablet Place 0.4 mg under the tongue every 5 (five) minutes as needed. For chest pain      . PARoxetine (PAXIL) 20 MG tablet Take 20 mg by mouth.        . spironolactone (ALDACTONE) 25 MG tablet Take 0.5 tablets (12.5 mg total) by mouth daily.  45 tablet  3  . warfarin (COUMADIN) 4 MG tablet Take 2-4 mg by mouth every evening. 1/2 tab Monday, Wednesday, and Friday then 1 tab all other days        Allergies  Review of patient's allergies indicates no known allergies.  Electrocardiogram:  Assessment and Plan

## 2012-02-24 NOTE — Assessment & Plan Note (Signed)
Euvolemic continue current meds   

## 2012-03-10 ENCOUNTER — Other Ambulatory Visit: Payer: Self-pay | Admitting: *Deleted

## 2012-03-10 DIAGNOSIS — I2510 Atherosclerotic heart disease of native coronary artery without angina pectoris: Secondary | ICD-10-CM

## 2012-03-10 DIAGNOSIS — E7800 Pure hypercholesterolemia, unspecified: Secondary | ICD-10-CM

## 2012-03-10 DIAGNOSIS — F411 Generalized anxiety disorder: Secondary | ICD-10-CM

## 2012-03-10 MED ORDER — ALPRAZOLAM 0.25 MG PO TABS: 0.2500 mg | ORAL_TABLET | Freq: Two times a day (BID) | ORAL | Status: DC

## 2012-03-13 ENCOUNTER — Telehealth: Payer: Self-pay | Admitting: *Deleted

## 2012-03-13 ENCOUNTER — Encounter: Payer: Self-pay | Admitting: *Deleted

## 2012-03-13 NOTE — Telephone Encounter (Signed)
PHARMACIST NOTIFIED  TO CALL DR ARONSON'S OFFICE  FOR REFILL REQUEST OF ALPRAZOLAM./CY

## 2012-03-16 ENCOUNTER — Ambulatory Visit (INDEPENDENT_AMBULATORY_CARE_PROVIDER_SITE_OTHER): Payer: Medicare Other | Admitting: *Deleted

## 2012-03-16 ENCOUNTER — Encounter: Payer: Self-pay | Admitting: Internal Medicine

## 2012-03-16 ENCOUNTER — Ambulatory Visit (INDEPENDENT_AMBULATORY_CARE_PROVIDER_SITE_OTHER): Payer: Medicare Other | Admitting: Internal Medicine

## 2012-03-16 VITALS — BP 124/76 | HR 64 | Ht 67.0 in | Wt 208.8 lb

## 2012-03-16 DIAGNOSIS — I498 Other specified cardiac arrhythmias: Secondary | ICD-10-CM

## 2012-03-16 DIAGNOSIS — I10 Essential (primary) hypertension: Secondary | ICD-10-CM

## 2012-03-16 DIAGNOSIS — I4891 Unspecified atrial fibrillation: Secondary | ICD-10-CM

## 2012-03-16 DIAGNOSIS — Z7901 Long term (current) use of anticoagulants: Secondary | ICD-10-CM

## 2012-03-16 DIAGNOSIS — Z950 Presence of cardiac pacemaker: Secondary | ICD-10-CM

## 2012-03-16 LAB — PACEMAKER DEVICE OBSERVATION
AL AMPLITUDE: 0.5 mv
ATRIAL PACING PM: 70
BATTERY VOLTAGE: 2.79 V
BRDY-0002RV: 60 {beats}/min
RV LEAD IMPEDENCE PM: 772 Ohm
VENTRICULAR PACING PM: 100

## 2012-03-16 NOTE — Patient Instructions (Signed)
The current medical regimen is effective;  continue present plan and medications.  Follow up in 1 year with Dr Taylor.  You will receive a letter in the mail 2 months before you are due.  Please call us when you receive this letter to schedule your follow up appointment.  

## 2012-03-16 NOTE — Assessment & Plan Note (Signed)
Her blood pressure is well controlled. I've encouraged the patient to maintain a low-sodium diet. No change in medical therapy.

## 2012-03-16 NOTE — Progress Notes (Signed)
HPI [name] returns today for followup. She is a very pleasant 77 year old woman with hypertension, chronic atrial fibrillation, symptomatic bradycardia, status post pacemaker insertion. In the interim, she has done well. She underwent pacemaker generator change just over a year ago. She has had no problems with chest pain, shortness of breath, or swelling in her device site. No fevers or chills. No Known Allergies   Current Outpatient Prescriptions  Medication Sig Dispense Refill  . ALPRAZolam (XANAX) 0.25 MG tablet Take 1 tablet (0.25 mg total) by mouth 2 (two) times daily.  60 tablet  5  . benazepril (LOTENSIN) 20 MG tablet Take 10 mg by mouth daily.       . furosemide (LASIX) 40 MG tablet Take 1 tablet (40 mg total) by mouth 2 (two) times daily.  60 tablet  10  . glipiZIDE-metformin (METAGLIP) 5-500 MG per tablet Take 1 tablet by mouth 2 (two) times daily before a meal.        . memantine (NAMENDA) 10 MG tablet Take 10 mg by mouth 2 (two) times daily.       . metoprolol (TOPROL-XL) 50 MG 24 hr tablet Take 1 tablet (50 mg total) by mouth daily.  90 tablet  2  . nitroGLYCERIN (NITROSTAT) 0.4 MG SL tablet Place 0.4 mg under the tongue every 5 (five) minutes as needed. For chest pain      . PARoxetine (PAXIL) 20 MG tablet Take 20 mg by mouth.        . spironolactone (ALDACTONE) 25 MG tablet Take 0.5 tablets (12.5 mg total) by mouth daily.  45 tablet  3  . warfarin (COUMADIN) 4 MG tablet Take 2-4 mg by mouth every evening. 1/2 tab Monday, Wednesday, and Friday then 1 tab all other days         Past Medical History  Diagnosis Date  . HYPERCHOLESTEROLEMIA   . ANXIETY   . Takotsubo cardiomyopathy     2006  . CARDIOMYOPATHY     Nonischemic; nonobstructive CAD, EF 35-40% by cath 07/2010, EF 25% by echo 07/2010  . Atrial fibrillation     on coumadin  . SICK SINUS SYNDROME     s/p Medtronic PPM '04, generator change '12  . CHF   . GERD   . Diabetes mellitus   . Coronary artery disease      non obstructive  . Myocardial infarction   . Hypertension   . Pacemaker     Medtronic  . Shortness of breath   . Arthritis   . Depression   . DEMENTIA     ROS:   All systems reviewed and negative except as noted in the HPI.   Past Surgical History  Procedure Date  . Coronary angioplasty   . Pacemaker insertion 2004, 2012    Medtronic   . Cervical spine surgery   . Back surgery   . Radical hysterectomy      Family History  Problem Relation Age of Onset  . Other      no known CAD     History   Social History  . Marital Status: Widowed    Spouse Name: N/A    Number of Children: N/A  . Years of Education: N/A   Occupational History  . Not on file.   Social History Main Topics  . Smoking status: Never Smoker   . Smokeless tobacco: Never Used  . Alcohol Use: No  . Drug Use: No  . Sexually Active: No   Other  Topics Concern  . Not on file   Social History Narrative  . No narrative on file     BP 124/76  Pulse 64  Ht 5\' 7"  (1.702 m)  Wt 208 lb 12.8 oz (94.711 kg)  BMI 32.70 kg/m2  Physical Exam:  Well appearing 77 year old woman, NAD HEENT: Unremarkable Neck:  No JVD, no thyromegally Lungs:  Clear with no wheezes, rales, or rhonchi. HEART:  IRegular rate rhythm, no murmurs, no rubs, no clicks Abd:  soft, positive bowel sounds, no organomegally, no rebound, no guarding Ext:  2 plus pulses, no edema, no cyanosis, no clubbing Skin:  No rashes no nodules Neuro:  CN II through XII intact, motor grossly intact  DEVICE  Normal device function.  See PaceArt for details.   Assess/Plan:

## 2012-03-16 NOTE — Assessment & Plan Note (Signed)
Her device is working normally. We'll plan to recheck in several months. 

## 2012-03-16 NOTE — Assessment & Plan Note (Signed)
She has atrial fibrillation but her rate has been well-controlled. Continue her current medical therapy.

## 2012-03-27 ENCOUNTER — Other Ambulatory Visit: Payer: Self-pay | Admitting: *Deleted

## 2012-03-27 MED ORDER — SPIRONOLACTONE 25 MG PO TABS: 12.5000 mg | ORAL_TABLET | Freq: Every day | ORAL | Status: DC

## 2012-04-26 ENCOUNTER — Other Ambulatory Visit: Payer: Self-pay | Admitting: *Deleted

## 2012-04-26 DIAGNOSIS — F411 Generalized anxiety disorder: Secondary | ICD-10-CM

## 2012-04-26 DIAGNOSIS — E7800 Pure hypercholesterolemia, unspecified: Secondary | ICD-10-CM

## 2012-04-26 DIAGNOSIS — I2510 Atherosclerotic heart disease of native coronary artery without angina pectoris: Secondary | ICD-10-CM

## 2012-04-26 MED ORDER — ALPRAZOLAM 0.25 MG PO TABS: 0.2500 mg | ORAL_TABLET | Freq: Two times a day (BID) | ORAL | Status: DC

## 2012-04-27 ENCOUNTER — Ambulatory Visit (INDEPENDENT_AMBULATORY_CARE_PROVIDER_SITE_OTHER): Payer: Medicare Other | Admitting: Pharmacist

## 2012-04-27 DIAGNOSIS — Z7901 Long term (current) use of anticoagulants: Secondary | ICD-10-CM

## 2012-04-27 DIAGNOSIS — I4891 Unspecified atrial fibrillation: Secondary | ICD-10-CM

## 2012-04-30 ENCOUNTER — Encounter (HOSPITAL_COMMUNITY): Payer: Self-pay

## 2012-04-30 ENCOUNTER — Emergency Department (HOSPITAL_COMMUNITY): Payer: Medicare Other

## 2012-04-30 ENCOUNTER — Emergency Department (HOSPITAL_COMMUNITY)
Admission: EM | Admit: 2012-04-30 | Discharge: 2012-04-30 | Disposition: A | Discharge status: Home / Self Care | Payer: Medicare Other | Attending: Emergency Medicine | Admitting: Emergency Medicine

## 2012-04-30 DIAGNOSIS — Z7901 Long term (current) use of anticoagulants: Secondary | ICD-10-CM | POA: Insufficient documentation

## 2012-04-30 DIAGNOSIS — R079 Chest pain, unspecified: Secondary | ICD-10-CM | POA: Insufficient documentation

## 2012-04-30 DIAGNOSIS — E119 Type 2 diabetes mellitus without complications: Secondary | ICD-10-CM | POA: Insufficient documentation

## 2012-04-30 DIAGNOSIS — F411 Generalized anxiety disorder: Secondary | ICD-10-CM | POA: Insufficient documentation

## 2012-04-30 DIAGNOSIS — R0602 Shortness of breath: Secondary | ICD-10-CM | POA: Insufficient documentation

## 2012-04-30 DIAGNOSIS — E7800 Pure hypercholesterolemia, unspecified: Secondary | ICD-10-CM | POA: Insufficient documentation

## 2012-04-30 DIAGNOSIS — I4891 Unspecified atrial fibrillation: Secondary | ICD-10-CM | POA: Insufficient documentation

## 2012-04-30 DIAGNOSIS — Z79899 Other long term (current) drug therapy: Secondary | ICD-10-CM | POA: Insufficient documentation

## 2012-04-30 DIAGNOSIS — I428 Other cardiomyopathies: Secondary | ICD-10-CM | POA: Insufficient documentation

## 2012-04-30 DIAGNOSIS — I252 Old myocardial infarction: Secondary | ICD-10-CM | POA: Insufficient documentation

## 2012-04-30 DIAGNOSIS — I10 Essential (primary) hypertension: Secondary | ICD-10-CM | POA: Insufficient documentation

## 2012-04-30 LAB — CBC WITH DIFFERENTIAL/PLATELET
Basophils Absolute: 0.1 10*3/uL (ref 0.0–0.1)
HCT: 37.9 % (ref 36.0–46.0)
Lymphocytes Relative: 19 % (ref 12–46)
Monocytes Absolute: 0.8 10*3/uL (ref 0.1–1.0)
Neutro Abs: 7.1 10*3/uL (ref 1.7–7.7)
Neutrophils Relative %: 71 % (ref 43–77)
RDW: 13.2 % (ref 11.5–15.5)
WBC: 10 10*3/uL (ref 4.0–10.5)

## 2012-04-30 LAB — BASIC METABOLIC PANEL
CO2: 27 mEq/L (ref 19–32)
Chloride: 102 mEq/L (ref 96–112)
Creatinine, Ser: 0.86 mg/dL (ref 0.50–1.10)
GFR calc Af Amer: 75 mL/min — ABNORMAL LOW (ref >90)
Potassium: 4.6 mEq/L (ref 3.5–5.1)
Sodium: 143 mEq/L (ref 135–145)

## 2012-04-30 LAB — URINALYSIS, ROUTINE W REFLEX MICROSCOPIC
Glucose, UA: NEGATIVE mg/dL
Nitrite: NEGATIVE
Specific Gravity, Urine: 1.017 (ref 1.005–1.030)
pH: 6.5 (ref 5.0–8.0)

## 2012-04-30 LAB — URINE MICROSCOPIC-ADD ON

## 2012-04-30 MED ORDER — ASPIRIN 81 MG PO CHEW: 324.0000 mg | CHEWABLE_TABLET | Freq: Once | ORAL | Status: DC
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
## 2012-04-30 NOTE — ED Notes (Signed)
Family at bedside. 

## 2012-04-30 NOTE — ED Notes (Signed)
Pt d/c home in NAD. Pt voiced understanding of d/c instructions and follow up care. Pt ambulated with quick steady gait.  

## 2012-04-30 NOTE — ED Notes (Signed)
Report to annette in cdu

## 2012-04-30 NOTE — ED Provider Notes (Signed)
Second troponin is negative. Patient continues to be pain free. She can be discharged home to follow up with Dr. Jacky Kindle as needed.  Rodena Medin, PA-C 04/30/12 1542
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
## 2012-05-01 LAB — URINE CULTURE: Colony Count: 15000

## 2012-05-06 NOTE — ED Provider Notes (Signed)
Medical screening examination/treatment/procedure(s) were conducted as a shared visit with non-physician practitioner(s) and myself.  I personally evaluated the patient during the encounter   Kayla Sprout, MD 05/06/12 2119

## 2012-05-14 ENCOUNTER — Telehealth: Payer: Self-pay | Admitting: Cardiovascular Disease

## 2012-05-14 NOTE — Telephone Encounter (Signed)
Please return call to patient daughter Thayer Ohm [phone], she would like to speak with nurse to discuss pt care

## 2012-05-14 NOTE — Telephone Encounter (Signed)
SPOKE WITH DAUGHTER IN GREAT LENGTH  PT IS TELLING EVERYONE THAT SHE HAS DISEASE UNDER HER ARM THAT RADIATES TO HEART  AND SHE CAN DIE AT ANY TIME PER DAUGHTER PT   IS TORMENTED WITH THIS  DAUGHTER REQUESTING OFFICE VISIT APPT  SCHEDULED  FOR 05-30-12 AT 3:00 PM  DAUGHTER ALSO WOULD LIKE A PHONE CALL PRIOR IF AT ALL POSSIBLE  DAUGHTER AWARE WILL FORWARD TO DR [name] .Zack Seal

## 2012-05-30 ENCOUNTER — Ambulatory Visit (INDEPENDENT_AMBULATORY_CARE_PROVIDER_SITE_OTHER): Payer: Medicare Other | Admitting: Cardiovascular Disease

## 2012-05-30 DIAGNOSIS — I2510 Atherosclerotic heart disease of native coronary artery without angina pectoris: Secondary | ICD-10-CM

## 2012-05-30 DIAGNOSIS — Z950 Presence of cardiac pacemaker: Secondary | ICD-10-CM

## 2012-05-30 DIAGNOSIS — E7800 Pure hypercholesterolemia, unspecified: Secondary | ICD-10-CM

## 2012-05-30 DIAGNOSIS — F411 Generalized anxiety disorder: Secondary | ICD-10-CM

## 2012-05-30 MED ORDER — BENAZEPRIL HCL 20 MG PO TABS: 10.0000 mg | ORAL_TABLET | Freq: Every day | ORAL | Status: DC

## 2012-05-30 NOTE — Patient Instructions (Signed)
Your physician wants you to follow-up in:  6 MONTHS WITH DR NISHAN  You will receive a reminder letter in the mail two months in advance. If you don't receive a letter, please call our office to schedule the follow-up appointment. Your physician recommends that you continue on your current medications as directed. Please refer to the Current Medication list given to you today. 

## 2012-05-30 NOTE — Assessment & Plan Note (Signed)
Normal function F/U Dr Ladona Ridgel next month

## 2012-05-30 NOTE — Assessment & Plan Note (Signed)
Stable with no angina and good activity level.  Continue medical Rx  

## 2012-05-30 NOTE — Progress Notes (Signed)
Patient ID: [name], female   DOB: Nov 26, 1934, 77 y.o.   MRN: [id number] [name] is a 77 yo Caucasian female with PMHx significant for CAD (nonobsructive CAD by cath 2011; EF 35-40%), Takatsubo CM (2006), NICM (EF 25% per 10/11 echo), atrial fibrillation (on Coumadin), SSS (s/p PPM 2004), HTN, HL, type 2 DM and anxiety Recent hospitalization 4/23 for atypical chest pain R/O. Resides at assisted living now. Dementia and psychiatric illness makes her hard to care for per daughter. LE edema from varicose veings. Long discussion with her about not dying from her heart.  Also discussed the fact that her pacer cannot come out  ROS: Denies fever, malais, weight loss, blurry vision, decreased visual acuity, cough, sputum, SOB, hemoptysis, pleuritic pain, palpitaitons, heartburn, abdominal pain, melena, lower extremity edema, claudication, or rash.  All other systems reviewed and negative  General: Affect appropriate Healthy:  appears stated age HEENT: normal Neck supple with no adenopathy JVP normal no bruits no thyromegaly Lungs clear with no wheezing and good diaphragmatic motion Heart:  S1/S2 no murmur, no rub, gallop or click PMI normal Abdomen: benighn, BS positve, no tenderness, no AAA no bruit.  No HSM or HJR Distal pulses intact with no bruits No edema Neuro non-focal Skin warm and dry No muscular weakness   Current Outpatient Prescriptions  Medication Sig Dispense Refill  . ALPRAZolam (XANAX) 0.25 MG tablet Take 1 tablet (0.25 mg total) by mouth 2 (two) times daily.  60 tablet  5  . benazepril (LOTENSIN) 20 MG tablet Take 0.5 tablets (10 mg total) by mouth daily.  30 tablet  11  . furosemide (LASIX) 40 MG tablet Take 1 tablet (40 mg total) by mouth 2 (two) times daily.  60 tablet  10  . glipiZIDE-metformin (METAGLIP) 5-500 MG per tablet Take 1 tablet by mouth 2 (two) times daily before a meal.        . memantine (NAMENDA) 10 MG tablet Take 10 mg by mouth 2 (two) times daily.         . metoprolol (TOPROL-XL) 50 MG 24 hr tablet Take 1 tablet (50 mg total) by mouth daily.  90 tablet  2  . nitroGLYCERIN (NITROSTAT) 0.4 MG SL tablet Place 0.4 mg under the tongue every 5 (five) minutes as needed. For chest pain      . PARoxetine (PAXIL) 20 MG tablet Take 20 mg by mouth.        . spironolactone (ALDACTONE) 25 MG tablet Take 0.5 tablets (12.5 mg total) by mouth daily.  45 tablet  3  . warfarin (COUMADIN) 4 MG tablet Take 2-4 mg by mouth every evening. 1 tab Mondays and Wednesday, 1/2 tablet all other days.      Marland Kitchen DISCONTD: benazepril (LOTENSIN) 20 MG tablet Take 10 mg by mouth daily.         Allergies  Review of patient's allergies indicates no known allergies.  Electrocardiogram:  Assessment and Plan

## 2012-05-30 NOTE — Assessment & Plan Note (Signed)
Major issue and difficult for daughter to deal with  F/U psych

## 2012-05-30 NOTE — Assessment & Plan Note (Signed)
Cholesterol is at goal.  Continue current dose of statin and diet Rx.  No myalgias or side effects.  F/U  LFT's in 6 months. No results found for this basename: LDLCALC             

## 2012-06-04 ENCOUNTER — Other Ambulatory Visit: Payer: Self-pay | Admitting: Pharmacist

## 2012-06-04 MED ORDER — WARFARIN SODIUM 4 MG PO TABS: 2.0000 mg | ORAL_TABLET | Freq: Every evening | ORAL | Status: DC

## 2012-06-15 ENCOUNTER — Ambulatory Visit (INDEPENDENT_AMBULATORY_CARE_PROVIDER_SITE_OTHER): Payer: Medicare Other | Admitting: Pharmacist

## 2012-06-15 DIAGNOSIS — I4891 Unspecified atrial fibrillation: Secondary | ICD-10-CM

## 2012-06-15 DIAGNOSIS — Z7901 Long term (current) use of anticoagulants: Secondary | ICD-10-CM

## 2012-06-15 LAB — POCT INR: INR: 2.5

## 2012-06-21 ENCOUNTER — Encounter: Payer: Medicare Other | Admitting: *Deleted

## 2012-07-06 ENCOUNTER — Encounter: Payer: Self-pay | Admitting: *Deleted

## 2012-07-27 ENCOUNTER — Ambulatory Visit (INDEPENDENT_AMBULATORY_CARE_PROVIDER_SITE_OTHER): Payer: Medicare Other | Admitting: Pharmacist

## 2012-07-27 DIAGNOSIS — I4891 Unspecified atrial fibrillation: Secondary | ICD-10-CM

## 2012-07-27 DIAGNOSIS — Z7901 Long term (current) use of anticoagulants: Secondary | ICD-10-CM

## 2012-07-27 LAB — POCT INR: INR: 1.8

## 2012-08-24 ENCOUNTER — Ambulatory Visit (INDEPENDENT_AMBULATORY_CARE_PROVIDER_SITE_OTHER): Payer: Medicare Other | Admitting: *Deleted

## 2012-08-24 DIAGNOSIS — I4891 Unspecified atrial fibrillation: Secondary | ICD-10-CM

## 2012-08-24 DIAGNOSIS — Z7901 Long term (current) use of anticoagulants: Secondary | ICD-10-CM

## 2012-09-11 ENCOUNTER — Other Ambulatory Visit: Payer: Self-pay | Admitting: Cardiovascular Disease

## 2012-09-11 ENCOUNTER — Other Ambulatory Visit: Payer: Self-pay | Admitting: *Deleted

## 2012-09-11 DIAGNOSIS — F411 Generalized anxiety disorder: Secondary | ICD-10-CM

## 2012-09-11 DIAGNOSIS — I2510 Atherosclerotic heart disease of native coronary artery without angina pectoris: Secondary | ICD-10-CM

## 2012-09-11 DIAGNOSIS — E7800 Pure hypercholesterolemia, unspecified: Secondary | ICD-10-CM

## 2012-09-11 MED ORDER — ALPRAZOLAM 0.25 MG PO TABS: 0.2500 mg | ORAL_TABLET | Freq: Two times a day (BID) | ORAL | Status: DC

## 2012-09-11 MED ORDER — METOPROLOL SUCCINATE ER 50 MG PO TB24: 50.0000 mg | ORAL_TABLET | Freq: Every day | ORAL | Status: DC

## 2012-09-12 ENCOUNTER — Other Ambulatory Visit: Payer: Self-pay | Admitting: *Deleted

## 2012-09-12 MED ORDER — METOPROLOL SUCCINATE ER 50 MG PO TB24: 50.0000 mg | ORAL_TABLET | Freq: Every day | ORAL | Status: DC

## 2012-09-27 ENCOUNTER — Ambulatory Visit (INDEPENDENT_AMBULATORY_CARE_PROVIDER_SITE_OTHER): Payer: Medicare Other

## 2012-09-27 DIAGNOSIS — I4891 Unspecified atrial fibrillation: Secondary | ICD-10-CM

## 2012-09-27 DIAGNOSIS — Z7901 Long term (current) use of anticoagulants: Secondary | ICD-10-CM

## 2012-10-18 ENCOUNTER — Ambulatory Visit (INDEPENDENT_AMBULATORY_CARE_PROVIDER_SITE_OTHER): Payer: Medicare PPO | Admitting: *Deleted

## 2012-10-18 ENCOUNTER — Encounter: Payer: Self-pay | Admitting: *Deleted

## 2012-10-18 DIAGNOSIS — I4891 Unspecified atrial fibrillation: Secondary | ICD-10-CM

## 2012-10-18 DIAGNOSIS — Z7901 Long term (current) use of anticoagulants: Secondary | ICD-10-CM

## 2012-10-26 ENCOUNTER — Ambulatory Visit (INDEPENDENT_AMBULATORY_CARE_PROVIDER_SITE_OTHER): Payer: Medicare PPO | Admitting: *Deleted

## 2012-10-26 DIAGNOSIS — Z950 Presence of cardiac pacemaker: Secondary | ICD-10-CM

## 2012-10-26 DIAGNOSIS — I495 Sick sinus syndrome: Secondary | ICD-10-CM

## 2012-10-27 ENCOUNTER — Encounter: Payer: Self-pay | Admitting: Internal Medicine

## 2012-10-29 ENCOUNTER — Telehealth: Payer: Self-pay | Admitting: Internal Medicine

## 2012-10-29 NOTE — Telephone Encounter (Signed)
Transmission was received. Spoke w/daughter to inform.

## 2012-10-29 NOTE — Telephone Encounter (Signed)
Pt's dtr calling to see if device ck was received she did over the weekend?

## 2012-11-02 LAB — REMOTE PACEMAKER DEVICE
AL AMPLITUDE: 1.4 mv
BATTERY VOLTAGE: 2.79 V
BRDY-0002RV: 60 {beats}/min
BRDY-0004RV: 130 {beats}/min
RV LEAD THRESHOLD: 0.625 V
VENTRICULAR PACING PM: 100

## 2012-11-08 ENCOUNTER — Encounter: Payer: Self-pay | Admitting: *Deleted

## 2012-11-15 ENCOUNTER — Ambulatory Visit (INDEPENDENT_AMBULATORY_CARE_PROVIDER_SITE_OTHER): Payer: Medicare PPO

## 2012-11-15 DIAGNOSIS — I4891 Unspecified atrial fibrillation: Secondary | ICD-10-CM

## 2012-11-15 DIAGNOSIS — Z7901 Long term (current) use of anticoagulants: Secondary | ICD-10-CM

## 2012-11-16 ENCOUNTER — Ambulatory Visit: Payer: Medicare PPO | Admitting: Cardiovascular Disease

## 2012-12-07 ENCOUNTER — Encounter: Payer: Self-pay | Admitting: Cardiovascular Disease

## 2012-12-07 ENCOUNTER — Ambulatory Visit (INDEPENDENT_AMBULATORY_CARE_PROVIDER_SITE_OTHER): Payer: Medicare PPO | Admitting: Cardiovascular Disease

## 2012-12-07 ENCOUNTER — Ambulatory Visit (INDEPENDENT_AMBULATORY_CARE_PROVIDER_SITE_OTHER): Payer: Medicare PPO | Admitting: *Deleted

## 2012-12-07 VITALS — BP 126/74 | HR 88 | Ht 67.0 in | Wt 204.6 lb

## 2012-12-07 DIAGNOSIS — I4891 Unspecified atrial fibrillation: Secondary | ICD-10-CM

## 2012-12-07 DIAGNOSIS — Z7901 Long term (current) use of anticoagulants: Secondary | ICD-10-CM

## 2012-12-07 DIAGNOSIS — I219 Acute myocardial infarction, unspecified: Secondary | ICD-10-CM

## 2012-12-07 DIAGNOSIS — F411 Generalized anxiety disorder: Secondary | ICD-10-CM

## 2012-12-07 DIAGNOSIS — I498 Other specified cardiac arrhythmias: Secondary | ICD-10-CM

## 2012-12-07 DIAGNOSIS — I10 Essential (primary) hypertension: Secondary | ICD-10-CM

## 2012-12-07 LAB — POCT INR: INR: 2.8

## 2012-12-07 NOTE — Patient Instructions (Addendum)
Your physician wants you to follow-up in: 6 months with Dr. [name]. You will receive a reminder letter in the mail two months in advance. If you don't receive a letter, please call our office to schedule the follow-up appointment.  Continue taking your medications as prescribed.

## 2012-12-07 NOTE — Assessment & Plan Note (Signed)
At assisted living Family visits 1-2/week  Her outbursts keep her from traveling and socializing Stable

## 2012-12-07 NOTE — Assessment & Plan Note (Signed)
Maint NSR to see coumadin clinic today

## 2012-12-07 NOTE — Assessment & Plan Note (Signed)
Stable with no angina and good activity level.  Continue medical Rx  

## 2012-12-07 NOTE — Progress Notes (Signed)
Patient ID: [name], female   DOB: Mar 04, 1935, 78 y.o.   MRN: [id number] Ms. Dingus is a 78 yo Caucasian female with PMHx significant for CAD (nonobsructive CAD by cath 2011; EF 35-40%), Takatsubo CM (2006), NICM (EF 25% per 10/11 echo), atrial fibrillation (on Coumadin), SSS (s/p PPM 2004), HTN, HL, type 2 DM and anxiety Hospitalization 01/31/12  for atypical chest pain R/O. Resides at assisted living now. Dementia and psychiatric illness makes her hard to care for per daughter. LE edema from varicose veings. Long discussion with her about not dying from her heart. Also discussed the fact that her pacer cannot come out   ROS: Denies fever, malais, weight loss, blurry vision, decreased visual acuity, cough, sputum, SOB, hemoptysis, pleuritic pain, palpitaitons, heartburn, abdominal pain, melena, lower extremity edema, claudication, or rash.  All other systems reviewed and negative  General: Affect appropriate Healthy:  appears stated age HEENT: normal Neck supple with no adenopathy JVP normal no bruits no thyromegaly Lungs clear with no wheezing and good diaphragmatic motion Heart:  S1/S2 no murmur, no rub, gallop or click PMI normal Abdomen: benighn, BS positve, no tenderness, no AAA no bruit.  No HSM or HJR Distal pulses intact with no bruits No edema Neuro non-focal Skin warm and dry No muscular weakness   Current Outpatient Prescriptions  Medication Sig Dispense Refill  . ALPRAZolam (XANAX) 0.25 MG tablet Take 1 tablet (0.25 mg total) by mouth 2 (two) times daily.  60 tablet  5  . benazepril (LOTENSIN) 20 MG tablet Take 0.5 tablets (10 mg total) by mouth daily.  30 tablet  11  . furosemide (LASIX) 40 MG tablet Take 1 tablet (40 mg total) by mouth 2 (two) times daily.  60 tablet  10  . glipiZIDE-metformin (METAGLIP) 5-500 MG per tablet Take 1 tablet by mouth 2 (two) times daily before a meal.        . memantine (NAMENDA) 10 MG tablet Take 10 mg by mouth 2 (two) times daily.        . metoprolol succinate (TOPROL-XL) 50 MG 24 hr tablet Take 1 tablet (50 mg total) by mouth daily.  30 tablet  3  . nitroGLYCERIN (NITROSTAT) 0.4 MG SL tablet Place 0.4 mg under the tongue every 5 (five) minutes as needed. For chest pain      . PARoxetine (PAXIL) 20 MG tablet Take 20 mg by mouth.        . spironolactone (ALDACTONE) 25 MG tablet Take 0.5 tablets (12.5 mg total) by mouth daily.  45 tablet  3  . warfarin (COUMADIN) 4 MG tablet Take 0.5-1 tablets (2-4 mg total) by mouth every evening. as directed  30 tablet  3   No current facility-administered medications for this visit.    Allergies  Review of patient's allergies indicates no known allergies.  Electrocardiogram:  Assessment and Plan

## 2012-12-07 NOTE — Assessment & Plan Note (Signed)
Normal pacer function f/u clinic not dependant

## 2012-12-07 NOTE — Assessment & Plan Note (Signed)
Well controlled.  Continue current medications and low sodium Dash type diet.    

## 2012-12-12 ENCOUNTER — Other Ambulatory Visit: Payer: Self-pay | Admitting: *Deleted

## 2012-12-12 DIAGNOSIS — R0602 Shortness of breath: Secondary | ICD-10-CM

## 2012-12-12 MED ORDER — FUROSEMIDE 40 MG PO TABS: 40.0000 mg | ORAL_TABLET | Freq: Two times a day (BID) | ORAL | Status: DC

## 2013-01-23 ENCOUNTER — Ambulatory Visit (INDEPENDENT_AMBULATORY_CARE_PROVIDER_SITE_OTHER): Payer: Medicare PPO | Admitting: *Deleted

## 2013-01-23 ENCOUNTER — Other Ambulatory Visit: Payer: Self-pay | Admitting: Internal Medicine

## 2013-01-23 ENCOUNTER — Ambulatory Visit (INDEPENDENT_AMBULATORY_CARE_PROVIDER_SITE_OTHER): Payer: Medicare PPO | Admitting: Pharmacist

## 2013-01-23 ENCOUNTER — Encounter: Payer: Self-pay | Admitting: Internal Medicine

## 2013-01-23 DIAGNOSIS — Z950 Presence of cardiac pacemaker: Secondary | ICD-10-CM

## 2013-01-23 DIAGNOSIS — I498 Other specified cardiac arrhythmias: Secondary | ICD-10-CM

## 2013-01-23 DIAGNOSIS — Z7901 Long term (current) use of anticoagulants: Secondary | ICD-10-CM

## 2013-01-23 DIAGNOSIS — I4891 Unspecified atrial fibrillation: Secondary | ICD-10-CM

## 2013-01-23 MED ORDER — WARFARIN SODIUM 4 MG PO TABS: 2.0000 mg | ORAL_TABLET | Freq: Every evening | ORAL | Status: DC

## 2013-01-30 LAB — REMOTE PACEMAKER DEVICE
AL AMPLITUDE: 1.4 mv
AL IMPEDENCE PM: 441 Ohm
ATRIAL PACING PM: 70
BATTERY VOLTAGE: 2.79 V
RV LEAD IMPEDENCE PM: 832 Ohm
VENTRICULAR PACING PM: 99

## 2013-02-08 ENCOUNTER — Encounter: Payer: Self-pay | Admitting: *Deleted

## 2013-02-28 ENCOUNTER — Encounter: Payer: Self-pay | Admitting: *Deleted

## 2013-03-06 ENCOUNTER — Ambulatory Visit (INDEPENDENT_AMBULATORY_CARE_PROVIDER_SITE_OTHER): Payer: Medicare PPO | Admitting: *Deleted

## 2013-03-06 DIAGNOSIS — Z7901 Long term (current) use of anticoagulants: Secondary | ICD-10-CM

## 2013-03-06 DIAGNOSIS — I4891 Unspecified atrial fibrillation: Secondary | ICD-10-CM

## 2013-03-06 LAB — POCT INR: INR: 2.7

## 2013-03-21 ENCOUNTER — Encounter: Payer: Self-pay | Admitting: Internal Medicine

## 2013-03-21 ENCOUNTER — Ambulatory Visit (INDEPENDENT_AMBULATORY_CARE_PROVIDER_SITE_OTHER): Payer: Medicare PPO | Admitting: Internal Medicine

## 2013-03-21 VITALS — BP 130/70 | HR 100 | Wt 204.0 lb

## 2013-03-21 DIAGNOSIS — I4891 Unspecified atrial fibrillation: Secondary | ICD-10-CM

## 2013-03-21 DIAGNOSIS — I2510 Atherosclerotic heart disease of native coronary artery without angina pectoris: Secondary | ICD-10-CM

## 2013-03-21 DIAGNOSIS — Z950 Presence of cardiac pacemaker: Secondary | ICD-10-CM

## 2013-03-21 DIAGNOSIS — E7800 Pure hypercholesterolemia, unspecified: Secondary | ICD-10-CM

## 2013-03-21 DIAGNOSIS — F411 Generalized anxiety disorder: Secondary | ICD-10-CM

## 2013-03-21 LAB — PACEMAKER DEVICE OBSERVATION
AL AMPLITUDE: 0.7 mv
BATTERY VOLTAGE: 2.79 V
BRDY-0002RV: 60 {beats}/min
BRDY-0004RV: 130 {beats}/min
RV LEAD AMPLITUDE: 15.67 mv
RV LEAD THRESHOLD: 0.5 V

## 2013-03-21 MED ORDER — ALPRAZOLAM 0.25 MG PO TABS: 0.2500 mg | ORAL_TABLET | Freq: Two times a day (BID) | ORAL | Status: DC

## 2013-03-21 NOTE — Assessment & Plan Note (Signed)
Her underlying rhythm today is atrial fibrillation. Will follow. She will continue coumadin.

## 2013-03-21 NOTE — Progress Notes (Signed)
HPI [name] returns today for followup. She is a pleasant 78 yo woman with a h/o symptomatic bradycardia, s/p PPM. She also has HTN and problems with anxiety. In the interim, she denies sob or syncope. She does have muscle aches in the chest after exertion, thought to be musculoskeletal pain.  No Known Allergies   Current Outpatient Prescriptions  Medication Sig Dispense Refill  . ALPRAZolam (XANAX) 0.25 MG tablet Take 1 tablet (0.25 mg total) by mouth 2 (two) times daily.  60 tablet  5  . benazepril (LOTENSIN) 20 MG tablet Take 0.5 tablets (10 mg total) by mouth daily.  30 tablet  11  . furosemide (LASIX) 40 MG tablet Take 1 tablet (40 mg total) by mouth 2 (two) times daily.  60 tablet  10  . glipiZIDE-metformin (METAGLIP) 5-500 MG per tablet Take 1 tablet by mouth 2 (two) times daily before a meal.        . memantine (NAMENDA) 10 MG tablet Take 10 mg by mouth 2 (two) times daily.       . metoprolol succinate (TOPROL-XL) 50 MG 24 hr tablet Take 1 tablet (50 mg total) by mouth daily.  30 tablet  3  . nitroGLYCERIN (NITROSTAT) 0.4 MG SL tablet Place 0.4 mg under the tongue every 5 (five) minutes as needed. For chest pain      . PARoxetine (PAXIL) 20 MG tablet Take 20 mg by mouth.        . spironolactone (ALDACTONE) 25 MG tablet Take 0.5 tablets (12.5 mg total) by mouth daily.  45 tablet  3  . warfarin (COUMADIN) 4 MG tablet Take 0.5-1 tablets (2-4 mg total) by mouth every evening. as directed  30 tablet  3   No current facility-administered medications for this visit.     Past Medical History  Diagnosis Date  . HYPERCHOLESTEROLEMIA   . ANXIETY   . Takotsubo cardiomyopathy     2006  . CARDIOMYOPATHY     Nonischemic; nonobstructive CAD, EF 35-40% by cath 07/2010, EF 25% by echo 07/2010  . Atrial fibrillation     on coumadin  . SICK SINUS SYNDROME     s/p Medtronic PPM '04, generator change '12  . CHF   . GERD   . Diabetes mellitus   . Coronary artery disease     non obstructive   . Myocardial infarction   . Hypertension   . Pacemaker     Medtronic  . Shortness of breath   . Arthritis   . Depression   . DEMENTIA     ROS:   All systems reviewed and negative except as noted in the HPI.   Past Surgical History  Procedure Laterality Date  . Coronary angioplasty    . Pacemaker insertion  2004, 2012    Medtronic   . Cervical spine surgery    . Back surgery    . Radical hysterectomy       Family History  Problem Relation Age of Onset  . Other      no known CAD     History   Social History  . Marital Status: Widowed    Spouse Name: N/A    Number of Children: N/A  . Years of Education: N/A   Occupational History  . Not on file.   Social History Main Topics  . Smoking status: Never Smoker   . Smokeless tobacco: Never Used  . Alcohol Use: No  . Drug Use: No  . Sexually Active: No  Other Topics Concern  . Not on file   Social History Narrative  . No narrative on file     BP 130/70  Pulse 100  Wt 204 lb (92.534 kg)  BMI 31.94 kg/m2  Physical Exam:  Well appearing elderly woman, NAD HEENT: Unremarkable Neck:  No JVD, no thyromegally Lungs:  Clear with no wheezes HEART:  Regular rate rhythm, no murmurs, no rubs, no clicks Abd:  soft, positive bowel sounds, no organomegally, no rebound, no guarding Ext:  2 plus pulses, no edema, no cyanosis, no clubbing Skin:  No rashes no nodules Neuro:  CN II through XII intact, motor grossly intact  DEVICE  Normal device function.  See PaceArt for details.   Assess/Plan:

## 2013-03-21 NOTE — Assessment & Plan Note (Signed)
Her Medtronic DDD PPM is working normally. Will recheck in several months.

## 2013-03-21 NOTE — Patient Instructions (Addendum)
Remote monitoring is used to monitor your Pacemaker of ICD from home. This monitoring reduces the number of office visits required to check your device to one time per year. It allows [place] to keep an eye on the functioning of your device to ensure it is working properly. You are scheduled for a device check from home on 06/24/13. You may send your transmission at any time that day. If you have a wireless device, the transmission will be sent automatically. After your physician reviews your transmission, you will receive a postcard with your next transmission date.  Your physician wants you to follow-up in: 1 year with Dr. Ladona Ridgel. You will receive a reminder letter in the mail two months in advance. If you don't receive a letter, please call our office to schedule the follow-up appointment.  Your physician recommends that you continue on your current medications as directed. Please refer to the Current Medication list given to you today.

## 2013-04-12 ENCOUNTER — Emergency Department (HOSPITAL_COMMUNITY)
Admission: EM | Admit: 2013-04-12 | Discharge: 2013-04-12 | Disposition: A | Discharge status: Skilled Nursing Facility | Payer: Medicare PPO | Attending: Emergency Medicine | Admitting: Emergency Medicine

## 2013-04-12 ENCOUNTER — Emergency Department (HOSPITAL_COMMUNITY): Payer: Medicare PPO

## 2013-04-12 ENCOUNTER — Encounter (HOSPITAL_COMMUNITY): Payer: Self-pay | Admitting: *Deleted

## 2013-04-12 DIAGNOSIS — Z8679 Personal history of other diseases of the circulatory system: Secondary | ICD-10-CM | POA: Insufficient documentation

## 2013-04-12 DIAGNOSIS — Z950 Presence of cardiac pacemaker: Secondary | ICD-10-CM | POA: Insufficient documentation

## 2013-04-12 DIAGNOSIS — Z7901 Long term (current) use of anticoagulants: Secondary | ICD-10-CM | POA: Insufficient documentation

## 2013-04-12 DIAGNOSIS — I252 Old myocardial infarction: Secondary | ICD-10-CM | POA: Insufficient documentation

## 2013-04-12 DIAGNOSIS — D72829 Elevated white blood cell count, unspecified: Secondary | ICD-10-CM | POA: Insufficient documentation

## 2013-04-12 DIAGNOSIS — M25519 Pain in unspecified shoulder: Secondary | ICD-10-CM | POA: Insufficient documentation

## 2013-04-12 DIAGNOSIS — I10 Essential (primary) hypertension: Secondary | ICD-10-CM | POA: Insufficient documentation

## 2013-04-12 DIAGNOSIS — F329 Major depressive disorder, single episode, unspecified: Secondary | ICD-10-CM | POA: Insufficient documentation

## 2013-04-12 DIAGNOSIS — I2510 Atherosclerotic heart disease of native coronary artery without angina pectoris: Secondary | ICD-10-CM | POA: Insufficient documentation

## 2013-04-12 DIAGNOSIS — Z8739 Personal history of other diseases of the musculoskeletal system and connective tissue: Secondary | ICD-10-CM | POA: Insufficient documentation

## 2013-04-12 DIAGNOSIS — M25511 Pain in right shoulder: Secondary | ICD-10-CM

## 2013-04-12 DIAGNOSIS — R52 Pain, unspecified: Secondary | ICD-10-CM | POA: Insufficient documentation

## 2013-04-12 DIAGNOSIS — Z8659 Personal history of other mental and behavioral disorders: Secondary | ICD-10-CM | POA: Insufficient documentation

## 2013-04-12 DIAGNOSIS — I509 Heart failure, unspecified: Secondary | ICD-10-CM | POA: Insufficient documentation

## 2013-04-12 DIAGNOSIS — Z79899 Other long term (current) drug therapy: Secondary | ICD-10-CM | POA: Insufficient documentation

## 2013-04-12 DIAGNOSIS — Z8639 Personal history of other endocrine, nutritional and metabolic disease: Secondary | ICD-10-CM | POA: Insufficient documentation

## 2013-04-12 DIAGNOSIS — Z8719 Personal history of other diseases of the digestive system: Secondary | ICD-10-CM | POA: Insufficient documentation

## 2013-04-12 DIAGNOSIS — F411 Generalized anxiety disorder: Secondary | ICD-10-CM | POA: Insufficient documentation

## 2013-04-12 DIAGNOSIS — E119 Type 2 diabetes mellitus without complications: Secondary | ICD-10-CM | POA: Insufficient documentation

## 2013-04-12 DIAGNOSIS — Z862 Personal history of diseases of the blood and blood-forming organs and certain disorders involving the immune mechanism: Secondary | ICD-10-CM | POA: Insufficient documentation

## 2013-04-12 DIAGNOSIS — F3289 Other specified depressive episodes: Secondary | ICD-10-CM | POA: Insufficient documentation

## 2013-04-12 LAB — CBC
Hemoglobin: 11.3 g/dL — ABNORMAL LOW (ref 12.0–15.0)
MCH: 31 pg (ref 26.0–34.0)
MCV: 92.3 fL (ref 78.0–100.0)
RBC: 3.65 MIL/uL — ABNORMAL LOW (ref 3.87–5.11)

## 2013-04-12 LAB — POCT I-STAT, CHEM 8
BUN: 25 mg/dL — ABNORMAL HIGH (ref 6–23)
Calcium, Ion: 1.13 mmol/L (ref 1.13–1.30)
Creatinine, Ser: 1 mg/dL (ref 0.50–1.10)
Glucose, Bld: 244 mg/dL — ABNORMAL HIGH (ref 70–99)
TCO2: 23 mmol/L (ref 0–100)

## 2013-04-12 MED ORDER — NAPROXEN 250 MG PO TABS
500.0000 mg | ORAL_TABLET | Freq: Once | ORAL | Status: AC
  Administered 2013-04-12: 500 mg via ORAL
  Filled 2013-04-12: qty 1

## 2013-04-12 NOTE — ED Provider Notes (Signed)
History    CSN: [id number] Arrival date & time 04/12/13  0532  First MD Initiated Contact with Patient 04/12/13 0533     Chief Complaint  Patient presents with  . Shoulder Pain   (Consider location/radiation/quality/duration/timing/severity/associated sxs/prior Treatment) HPI Comments: This 78 year old female who has a history of anxiety, nonischemic nonobstructive cardiomyopathy with a echocardiogram showing no significant obstructions in 2011 who also has sick sinus syndrome status post pacemaker placement. She presents with a complaint of right shoulder pain and upper chest discomfort. She states that while she was watching a movie last night she developed acute onset of sharp and stabbing pain in her epigastrium which to the top of her chest and is maintained as a constant sharp and stabbing pain in her right shoulder. Nothing seems to make this better or worse, other than using her right arm. She has no numbness or as, no swelling of her legs, has frequent exacerbations in the past which are thought to be related to musculoskeletal pain. She denies fevers, cough, shortness of breath. Paramedics found the patient to appear comfortable, had normal vital signs rhythm strips.  The history is provided by the patient, the EMS personnel and medical records.   Past Medical History  Diagnosis Date  . HYPERCHOLESTEROLEMIA   . ANXIETY   . Takotsubo cardiomyopathy     2006  . CARDIOMYOPATHY     Nonischemic; nonobstructive CAD, EF 35-40% by cath 07/2010, EF 25% by echo 07/2010  . Atrial fibrillation     on coumadin  . SICK SINUS SYNDROME     s/p Medtronic PPM '04, generator change '12  . CHF   . GERD   . Diabetes mellitus   . Coronary artery disease     non obstructive  . Myocardial infarction   . Hypertension   . Pacemaker     Medtronic  . Shortness of breath   . Arthritis   . Depression   . DEMENTIA    Past Surgical History  Procedure Laterality Date  . Coronary angioplasty     . Pacemaker insertion  2004, 2012    Medtronic   . Cervical spine surgery    . Back surgery    . Radical hysterectomy     Family History  Problem Relation Age of Onset  . Other      no known CAD   History  Substance Use Topics  . Smoking status: Never Smoker   . Smokeless tobacco: Never Used  . Alcohol Use: No   OB History   Grav Para Term Preterm Abortions TAB SAB Ect Mult Living                 Review of Systems  All other systems reviewed and are negative.    Allergies  Review of patient's allergies indicates no known allergies.  Home Medications   Current Outpatient Rx  Name  Route  Sig  Dispense  Refill  . ALPRAZolam (XANAX) 0.25 MG tablet   Oral   Take 1 tablet (0.25 mg total) by mouth 2 (two) times daily.   60 tablet   5   . benazepril (LOTENSIN) 20 MG tablet   Oral   Take 0.5 tablets (10 mg total) by mouth daily.   30 tablet   11   . furosemide (LASIX) 40 MG tablet   Oral   Take 1 tablet (40 mg total) by mouth 2 (two) times daily.   60 tablet   10   . glipiZIDE-metformin (  METAGLIP) 5-500 MG per tablet   Oral   Take 1 tablet by mouth 2 (two) times daily before a meal.           . memantine (NAMENDA) 10 MG tablet   Oral   Take 10 mg by mouth 2 (two) times daily.          . metoprolol succinate (TOPROL-XL) 50 MG 24 hr tablet   Oral   Take 1 tablet (50 mg total) by mouth daily.   30 tablet   3   . nitroGLYCERIN (NITROSTAT) 0.4 MG SL tablet   Sublingual   Place 0.4 mg under the tongue every 5 (five) minutes as needed. For chest pain         . PARoxetine (PAXIL) 20 MG tablet   Oral   Take 20 mg by mouth.           . spironolactone (ALDACTONE) 25 MG tablet   Oral   Take 0.5 tablets (12.5 mg total) by mouth daily.   45 tablet   3   . warfarin (COUMADIN) 4 MG tablet   Oral   Take 2-4 mg by mouth daily. Take 1/2 tablet on Monday and Wednesday then take 1 tablet on Tuesday, Thursday, Friday, Saturday and Sunday          BP  106/88  Pulse 79  Temp(Src) 98.9 F (37.2 C) (Oral)  Resp 20  SpO2 100% Physical Exam  Nursing note and vitals reviewed. Constitutional: She appears well-developed and well-nourished. No distress.  HENT:  Head: Normocephalic and atraumatic.  Mouth/Throat: Oropharynx is clear and moist. No oropharyngeal exudate.  Eyes: Conjunctivae and EOM are normal. Pupils are equal, round, and reactive to light. Right eye exhibits no discharge. Left eye exhibits no discharge. No scleral icterus.  Neck: Normal range of motion. Neck supple. No JVD present. No thyromegaly present.  Cardiovascular: Normal rate, regular rhythm, normal heart sounds and intact distal pulses.  Exam reveals no gallop and no friction rub.   No murmur heard. Pulmonary/Chest: Effort normal and breath sounds normal. No respiratory distress. She has no wheezes. She has no rales. She exhibits tenderness ( Upper chest wall tenderness on the right and left parasternal areas).  Abdominal: Soft. Bowel sounds are normal. She exhibits no distension and no mass. There is no tenderness.  Musculoskeletal: Normal range of motion. She exhibits tenderness ( Tenderness to range of motion of the right arm and palpation over the, mild). She exhibits no edema.  Lymphadenopathy:    She has no cervical adenopathy.  Neurological: She is alert. Coordination normal.  Skin: Skin is warm and dry. No rash noted. No erythema.  Psychiatric: She has a normal mood and affect. Her behavior is normal.    ED Course  Procedures (including critical care time) Labs Reviewed  CBC - Abnormal; Notable for the following:    WBC 12.0 (*)    RBC 3.65 (*)    Hemoglobin 11.3 (*)    HCT 33.7 (*)    All other components within normal limits  POCT I-STAT, CHEM 8 - Abnormal; Notable for the following:    BUN 25 (*)    Glucose, Bld 244 (*)    Hemoglobin 11.9 (*)    HCT 35.0 (*)    All other components within normal limits  POCT I-STAT TROPONIN I   Dg Chest Port 1  View  04/12/2013   *RADIOLOGY REPORT*  Clinical Data: Chest pain.  PORTABLE CHEST - 1 VIEW  Comparison:  04/30/2012.  Findings: Dual lead left subclavian cardiac pacemaker.  The lung bases are excluded from view.  There appears to be cardiomegaly. Interstitial and alveolar pulmonary edema is present.  There is also pulmonary vascular congestion.  IMPRESSION: Moderate CHF.   Original Report Authenticated By: Andreas Newport, M.D.   1. Shoulder pain, acute, right   2. Leukocytosis   3. CHF (congestive heart failure), unspecified failure chronicity, unspecified type     MDM  Overall the patient will she has not had any focal signs of ischemia or neurologic deficits, her pain is likely muscular chest wall, will treat with anti-inflammatories.  Lab work and x-ray and EKG   ED ECG REPORT  I personally interpreted this EKG   Date: 04/12/2013   Rate: 68  Rhythm: Electronically paced rhythm  QRS Axis: left  Intervals: widened QRS  ST/T Wave abnormalities: nonspecific ST/T changes  Conduction Disutrbances:Paced  Narrative Interpretation:   Old EKG Reviewed: SLT rate slowed  The patient continues to appear comfortable, normal vital signs, normal laboratory workup other than a slight leukocytosis. The patient has been informed of these results, she needs to take an extra dose of Lasix the next couple of days and followup with her doctor on Monday.  The patient is requesting discharge stating that she wants to go home and that she feels good.  Vida Roller, MD 04/12/13 0730

## 2013-04-12 NOTE — ED Notes (Signed)
Patient resting on stretcher states the pain in her right shoulder comes and goes. States she was watching a movie earlier tonight and had a sharp stabbing pain in her upper abd. And pain in her right shoulder , states she has been seen for same by her MD and was told it was muscle pain. Patient is bery jumpy

## 2013-04-17 ENCOUNTER — Other Ambulatory Visit: Payer: Self-pay | Admitting: *Deleted

## 2013-04-17 ENCOUNTER — Ambulatory Visit (INDEPENDENT_AMBULATORY_CARE_PROVIDER_SITE_OTHER): Payer: Medicare PPO | Admitting: *Deleted

## 2013-04-17 DIAGNOSIS — I4891 Unspecified atrial fibrillation: Secondary | ICD-10-CM

## 2013-04-17 DIAGNOSIS — Z7901 Long term (current) use of anticoagulants: Secondary | ICD-10-CM

## 2013-04-17 MED ORDER — SPIRONOLACTONE 25 MG PO TABS: 12.5000 mg | ORAL_TABLET | Freq: Every day | ORAL | Status: DC

## 2013-05-29 ENCOUNTER — Ambulatory Visit (INDEPENDENT_AMBULATORY_CARE_PROVIDER_SITE_OTHER): Payer: Medicare PPO | Admitting: Pharmacist

## 2013-05-29 DIAGNOSIS — I4891 Unspecified atrial fibrillation: Secondary | ICD-10-CM

## 2013-05-29 DIAGNOSIS — Z7901 Long term (current) use of anticoagulants: Secondary | ICD-10-CM

## 2013-06-07 ENCOUNTER — Other Ambulatory Visit: Payer: Self-pay | Admitting: *Deleted

## 2013-06-07 MED ORDER — BENAZEPRIL HCL 20 MG PO TABS: 10.0000 mg | ORAL_TABLET | Freq: Every day | ORAL | Status: DC

## 2013-06-12 ENCOUNTER — Ambulatory Visit (INDEPENDENT_AMBULATORY_CARE_PROVIDER_SITE_OTHER): Payer: Medicare PPO | Admitting: *Deleted

## 2013-06-12 ENCOUNTER — Ambulatory Visit: Payer: Medicare PPO | Admitting: Cardiovascular Disease

## 2013-06-12 DIAGNOSIS — I498 Other specified cardiac arrhythmias: Secondary | ICD-10-CM

## 2013-06-12 DIAGNOSIS — I4891 Unspecified atrial fibrillation: Secondary | ICD-10-CM

## 2013-06-20 LAB — REMOTE PACEMAKER DEVICE
AL IMPEDENCE PM: 383 Ohm
BRDY-0004RV: 130 {beats}/min
RV LEAD THRESHOLD: 0.5 V

## 2013-07-02 ENCOUNTER — Encounter: Payer: Self-pay | Admitting: *Deleted

## 2013-07-03 ENCOUNTER — Ambulatory Visit (INDEPENDENT_AMBULATORY_CARE_PROVIDER_SITE_OTHER): Payer: Medicare PPO | Admitting: Pharmacist

## 2013-07-03 DIAGNOSIS — Z7901 Long term (current) use of anticoagulants: Secondary | ICD-10-CM

## 2013-07-03 DIAGNOSIS — I4891 Unspecified atrial fibrillation: Secondary | ICD-10-CM

## 2013-07-16 ENCOUNTER — Encounter: Payer: Self-pay | Admitting: Internal Medicine

## 2013-07-18 ENCOUNTER — Other Ambulatory Visit: Payer: Self-pay | Admitting: *Deleted

## 2013-07-18 MED ORDER — WARFARIN SODIUM 4 MG PO TABS: 2.0000 mg | ORAL_TABLET | ORAL | Status: DC

## 2013-07-19 ENCOUNTER — Encounter: Payer: Self-pay | Admitting: Cardiovascular Disease

## 2013-07-19 ENCOUNTER — Ambulatory Visit (INDEPENDENT_AMBULATORY_CARE_PROVIDER_SITE_OTHER): Payer: Medicare PPO | Admitting: Cardiovascular Disease

## 2013-07-19 VITALS — BP 138/72 | HR 72 | Ht 67.0 in | Wt 209.0 lb

## 2013-07-19 DIAGNOSIS — Z0000 Encounter for general adult medical examination without abnormal findings: Secondary | ICD-10-CM

## 2013-07-19 DIAGNOSIS — I495 Sick sinus syndrome: Secondary | ICD-10-CM

## 2013-07-19 DIAGNOSIS — I4891 Unspecified atrial fibrillation: Secondary | ICD-10-CM

## 2013-07-19 DIAGNOSIS — I10 Essential (primary) hypertension: Secondary | ICD-10-CM

## 2013-07-19 DIAGNOSIS — I2510 Atherosclerotic heart disease of native coronary artery without angina pectoris: Secondary | ICD-10-CM

## 2013-07-19 NOTE — Assessment & Plan Note (Signed)
Well controlled.  Continue current medications and low sodium Dash type diet.    

## 2013-07-19 NOTE — Assessment & Plan Note (Signed)
No palpitations continue anticoagulation for now unless family indicates falling continues

## 2013-07-19 NOTE — Progress Notes (Signed)
Patient ID: [name], female   DOB: [birth year], 78 y.o.   MRN: [id number] [name] is a 78 yo Caucasian female with PMHx significant for CAD (nonobsructive CAD by cath 2011; EF 35-40%), Takatsubo CM (2006), NICM (EF 25% per 10/11 echo), atrial fibrillation (on Coumadin), SSS (s/p PPM 2004), HTN, HL, type 2 DM and anxiety Hospitalization 01/31/12 for atypical chest pain R/O. Resides at assisted living now. Dementia and psychiatric illness makes her hard to care for per daughter. LE edema from varicose veings. Long discussion with her about not dying from her heart. Also discussed the fact that her pacer cannot come out  She has fallen a few times Discussed possibly needing to stop coumadin in future On klonipan instead xanax and this may be contributing    ROS: Denies fever, malais, weight loss, blurry vision, decreased visual acuity, cough, sputum, SOB, hemoptysis, pleuritic pain, palpitaitons, heartburn, abdominal pain, melena, lower extremity edema, claudication, or rash.  All other systems reviewed and negative  General: Affect appropriate Healthy:  appears stated age HEENT: normal Neck supple with no adenopathy JVP normal no bruits no thyromegaly Lungs clear with no wheezing and good diaphragmatic motion Heart:  S1/S2 no murmur, no rub, gallop or click PMI normal Abdomen: benighn, BS positve, no tenderness, no AAA no bruit.  No HSM or HJR Distal pulses intact with no bruits No edema Neuro non-focal Skin warm and dry No muscular weakness   Current Outpatient Prescriptions  Medication Sig Dispense Refill  . benazepril (LOTENSIN) 20 MG tablet Take 0.5 tablets (10 mg total) by mouth daily.  30 tablet  3  . clonazePAM (KLONOPIN) 0.5 MG tablet Take 0.5 mg by mouth 2 (two) times daily.      . furosemide (LASIX) 40 MG tablet Take 1 tablet (40 mg total) by mouth 2 (two) times daily.  60 tablet  10  . glipiZIDE-metformin (METAGLIP) 5-500 MG per tablet Take 1 tablet by mouth 2 (two) times  daily before a meal.        . memantine (NAMENDA) 10 MG tablet Take 10 mg by mouth 2 (two) times daily.       . metoprolol succinate (TOPROL-XL) 50 MG 24 hr tablet Take 1 tablet (50 mg total) by mouth daily.  30 tablet  3  . nitroGLYCERIN (NITROSTAT) 0.4 MG SL tablet Place 0.4 mg under the tongue every 5 (five) minutes as needed. For chest pain      . PARoxetine (PAXIL) 20 MG tablet Take 20 mg by mouth.        . spironolactone (ALDACTONE) 25 MG tablet Take 0.5 tablets (12.5 mg total) by mouth daily.  45 tablet  3  . warfarin (COUMADIN) 4 MG tablet Take 0.5-1 tablets (2-4 mg total) by mouth as directed.  30 tablet  3   No current facility-administered medications for this visit.    Allergies  Review of patient's allergies indicates no known allergies.  Electrocardiogram:  04/13/13  Flutter rate 68 V pacing   Assessment and Plan

## 2013-07-19 NOTE — Assessment & Plan Note (Signed)
Normal pacer funtion F/U Dr Ladona Ridgel

## 2013-07-19 NOTE — Assessment & Plan Note (Signed)
Stable with no angina and good activity level.  Continue medical Rx  

## 2013-07-19 NOTE — Patient Instructions (Signed)
Your physician wants you to follow-up in:  6 MONTHS WITH DR NISHAN  You will receive a reminder letter in the mail two months in advance. If you don't receive a letter, please call our office to schedule the follow-up appointment. Your physician recommends that you continue on your current medications as directed. Please refer to the Current Medication list given to you today. 

## 2013-08-07 ENCOUNTER — Ambulatory Visit (INDEPENDENT_AMBULATORY_CARE_PROVIDER_SITE_OTHER): Payer: Medicare PPO | Admitting: Pharmacist

## 2013-08-07 DIAGNOSIS — I4891 Unspecified atrial fibrillation: Secondary | ICD-10-CM

## 2013-08-07 DIAGNOSIS — Z7901 Long term (current) use of anticoagulants: Secondary | ICD-10-CM

## 2013-09-11 ENCOUNTER — Ambulatory Visit (INDEPENDENT_AMBULATORY_CARE_PROVIDER_SITE_OTHER): Payer: Medicare PPO | Admitting: *Deleted

## 2013-09-11 DIAGNOSIS — I4891 Unspecified atrial fibrillation: Secondary | ICD-10-CM

## 2013-09-11 DIAGNOSIS — Z7901 Long term (current) use of anticoagulants: Secondary | ICD-10-CM

## 2013-09-11 LAB — POCT INR: INR: 2.8

## 2013-09-16 ENCOUNTER — Ambulatory Visit (INDEPENDENT_AMBULATORY_CARE_PROVIDER_SITE_OTHER): Payer: Medicare PPO

## 2013-09-16 DIAGNOSIS — Z950 Presence of cardiac pacemaker: Secondary | ICD-10-CM

## 2013-09-16 DIAGNOSIS — I498 Other specified cardiac arrhythmias: Secondary | ICD-10-CM

## 2013-09-17 ENCOUNTER — Encounter: Payer: Self-pay | Admitting: Internal Medicine

## 2013-09-25 LAB — MDC_IDC_ENUM_SESS_TYPE_REMOTE
Battery Impedance: 174 Ohm
Brady Statistic AS VS Percent: 1 %
Lead Channel Impedance Value: 729 Ohm
Lead Channel Pacing Threshold Pulse Width: 0.4 ms
Lead Channel Sensing Intrinsic Amplitude: 1.4 mV
Lead Channel Setting Pacing Amplitude: 3.5 V

## 2013-10-07 ENCOUNTER — Encounter: Payer: Self-pay | Admitting: *Deleted

## 2013-10-22 ENCOUNTER — Ambulatory Visit (INDEPENDENT_AMBULATORY_CARE_PROVIDER_SITE_OTHER): Payer: Medicare PPO

## 2013-10-22 DIAGNOSIS — I4891 Unspecified atrial fibrillation: Secondary | ICD-10-CM

## 2013-10-22 DIAGNOSIS — Z7901 Long term (current) use of anticoagulants: Secondary | ICD-10-CM

## 2013-10-22 LAB — POCT INR: INR: 2.6

## 2013-10-23 ENCOUNTER — Other Ambulatory Visit: Payer: Self-pay

## 2013-10-23 MED ORDER — METOPROLOL SUCCINATE ER 50 MG PO TB24: 50.0000 mg | ORAL_TABLET | Freq: Every day | ORAL | Status: DC

## 2013-11-09 ENCOUNTER — Emergency Department (HOSPITAL_COMMUNITY)
Admission: EM | Admit: 2013-11-09 | Discharge: 2013-11-10 | Disposition: A | Discharge status: Home / Self Care | Payer: Medicare PPO | Attending: Emergency Medicine | Admitting: Emergency Medicine

## 2013-11-09 ENCOUNTER — Emergency Department (HOSPITAL_COMMUNITY): Payer: Medicare PPO

## 2013-11-09 DIAGNOSIS — F329 Major depressive disorder, single episode, unspecified: Secondary | ICD-10-CM | POA: Insufficient documentation

## 2013-11-09 DIAGNOSIS — Z950 Presence of cardiac pacemaker: Secondary | ICD-10-CM | POA: Insufficient documentation

## 2013-11-09 DIAGNOSIS — I252 Old myocardial infarction: Secondary | ICD-10-CM | POA: Insufficient documentation

## 2013-11-09 DIAGNOSIS — E7800 Pure hypercholesterolemia, unspecified: Secondary | ICD-10-CM | POA: Insufficient documentation

## 2013-11-09 DIAGNOSIS — R0602 Shortness of breath: Secondary | ICD-10-CM | POA: Insufficient documentation

## 2013-11-09 DIAGNOSIS — R079 Chest pain, unspecified: Secondary | ICD-10-CM

## 2013-11-09 DIAGNOSIS — I5181 Takotsubo syndrome: Secondary | ICD-10-CM | POA: Insufficient documentation

## 2013-11-09 DIAGNOSIS — E119 Type 2 diabetes mellitus without complications: Secondary | ICD-10-CM | POA: Insufficient documentation

## 2013-11-09 DIAGNOSIS — I2510 Atherosclerotic heart disease of native coronary artery without angina pectoris: Secondary | ICD-10-CM | POA: Insufficient documentation

## 2013-11-09 DIAGNOSIS — I509 Heart failure, unspecified: Secondary | ICD-10-CM | POA: Insufficient documentation

## 2013-11-09 DIAGNOSIS — I10 Essential (primary) hypertension: Secondary | ICD-10-CM | POA: Insufficient documentation

## 2013-11-09 DIAGNOSIS — F068 Other specified mental disorders due to known physiological condition: Secondary | ICD-10-CM | POA: Insufficient documentation

## 2013-11-09 DIAGNOSIS — K219 Gastro-esophageal reflux disease without esophagitis: Secondary | ICD-10-CM | POA: Insufficient documentation

## 2013-11-09 DIAGNOSIS — I4891 Unspecified atrial fibrillation: Secondary | ICD-10-CM | POA: Insufficient documentation

## 2013-11-09 DIAGNOSIS — I428 Other cardiomyopathies: Secondary | ICD-10-CM | POA: Insufficient documentation

## 2013-11-09 DIAGNOSIS — F411 Generalized anxiety disorder: Secondary | ICD-10-CM | POA: Insufficient documentation

## 2013-11-09 DIAGNOSIS — F3289 Other specified depressive episodes: Secondary | ICD-10-CM | POA: Insufficient documentation

## 2013-11-09 DIAGNOSIS — I495 Sick sinus syndrome: Secondary | ICD-10-CM | POA: Insufficient documentation

## 2013-11-09 DIAGNOSIS — Z7901 Long term (current) use of anticoagulants: Secondary | ICD-10-CM | POA: Insufficient documentation

## 2013-11-09 DIAGNOSIS — M129 Arthropathy, unspecified: Secondary | ICD-10-CM | POA: Insufficient documentation

## 2013-11-09 DIAGNOSIS — Z79899 Other long term (current) drug therapy: Secondary | ICD-10-CM | POA: Insufficient documentation

## 2013-11-09 NOTE — ED Notes (Signed)
Reported experiencing left sided chest pain that radiated to right chest area since eating breakfast this morning. Took 324mg  Aspirin by EMS.  Chest pain subsided en route to hospital.

## 2013-11-09 NOTE — ED Provider Notes (Signed)
CSN: [id number]     Arrival date & time 11/09/13  2301 History   First MD Initiated Contact with Patient 11/09/13 2348     Chief Complaint  Patient presents with  . Chest Pain   (Consider location/radiation/quality/duration/timing/severity/associated sxs/prior Treatment) Patient is a 79 y.o. female presenting with chest pain. The history is provided by the patient. The history is limited by the condition of the patient (Dementia, psychiatric condition).  Chest Pain She has dementia and a psychiatric history and is not a reliable historian. She lives at an assisted living facility and called her daughter is to state that she was having some chest pains. He apparently been present for most of the day and through to the back. She denies dyspnea, nausea, diaphoresis. She states she's not having any pain currently and that it improved as she was coming to the hospital by ambulance.  Past Medical History  Diagnosis Date  . HYPERCHOLESTEROLEMIA   . ANXIETY   . Takotsubo cardiomyopathy     2006  . CARDIOMYOPATHY     Nonischemic; nonobstructive CAD, EF 35-40% by cath 07/2010, EF 25% by echo 07/2010  . Atrial fibrillation     on coumadin  . SICK SINUS SYNDROME     s/p Medtronic PPM '04, generator change '12  . CHF   . GERD   . Diabetes mellitus   . Coronary artery disease     non obstructive  . Myocardial infarction   . Hypertension   . Pacemaker     Medtronic  . Shortness of breath   . Arthritis   . Depression   . DEMENTIA    Past Surgical History  Procedure Laterality Date  . Coronary angioplasty    . Pacemaker insertion  2004, 2012    Medtronic   . Cervical spine surgery    . Back surgery    . Radical hysterectomy     Family History  Problem Relation Age of Onset  . Other      no known CAD   History  Substance Use Topics  . Smoking status: Never Smoker   . Smokeless tobacco: Never Used  . Alcohol Use: No   OB History   Grav Para Term Preterm Abortions TAB SAB Ect  Mult Living                 Review of Systems  Unable to perform ROS: Psychiatric disorder  Cardiovascular: Positive for chest pain.    Allergies  Review of patient's allergies indicates no known allergies.  Home Medications   Current Outpatient Rx  Name  Route  Sig  Dispense  Refill  . benazepril (LOTENSIN) 20 MG tablet   Oral   Take 0.5 tablets (10 mg total) by mouth daily.   30 tablet   3   . clonazePAM (KLONOPIN) 0.5 MG tablet   Oral   Take 0.5 mg by mouth 2 (two) times daily.         Marland Kitchen escitalopram (LEXAPRO) 10 MG tablet   Oral   Take 10 mg by mouth daily.         . furosemide (LASIX) 40 MG tablet   Oral   Take 1 tablet (40 mg total) by mouth 2 (two) times daily.   60 tablet   10   . glipiZIDE-metformin (METAGLIP) 5-500 MG per tablet   Oral   Take 1 tablet by mouth 2 (two) times daily before a meal.           .  memantine (NAMENDA) 10 MG tablet   Oral   Take 10 mg by mouth 2 (two) times daily.          . metoprolol succinate (TOPROL-XL) 50 MG 24 hr tablet   Oral   Take 1 tablet (50 mg total) by mouth daily.   30 tablet   3   . nystatin-triamcinolone (MYCOLOG II) cream   Topical   Apply 1 application topically 2 (two) times daily.         Marland Kitchen spironolactone (ALDACTONE) 25 MG tablet   Oral   Take 0.5 tablets (12.5 mg total) by mouth daily.   45 tablet   3   . warfarin (COUMADIN) 4 MG tablet   Oral   Take 2-4 mg by mouth See admin instructions. Take 1 tablet on Monday and Wednesday. Take 0.5 tablet all other days.         . nitroGLYCERIN (NITROSTAT) 0.4 MG SL tablet   Sublingual   Place 0.4 mg under the tongue every 5 (five) minutes as needed. For chest pain          BP 152/45  Pulse 70  Temp(Src) 99 F (37.2 C) (Oral)  Resp 24  Ht 5\' 5"  (1.651 m)  Wt 200 lb (90.719 kg)  BMI 33.28 kg/m2  SpO2 95% Physical Exam  Nursing note and vitals reviewed.  79 year old female, resting comfortably and in no acute distress. Vital signs  are significant for tachypnea with respiratory rate of 24, and hypertension with blood pressure 152/45. Oxygen saturation is 95%, which is normal. Head is normocephalic and atraumatic. PERRLA, EOMI. Oropharynx is clear. Neck is nontender and supple without adenopathy or JVD. Back is nontender and there is no CVA tenderness. Lungs are clear without rales, wheezes, or rhonchi. Chest is nontender. Heart has regular rate and rhythm without murmur. Abdomen is soft, flat, nontender without masses or hepatosplenomegaly and peristalsis is normoactive. Extremities have no cyanosis or edema, full range of motion is present. Skin is warm and dry without rash. Neurologic: She is awake, alert, oriented, cranial nerves are intact, there are no motor or sensory deficits.  ED Course  Procedures (including critical care time) Labs Review Results for orders placed during the hospital encounter of 11/09/13  CBC      Result Value Range   WBC 14.2 (*) 4.0 - 10.5 K/uL   RBC 3.71 (*) 3.87 - 5.11 MIL/uL   Hemoglobin 11.7 (*) 12.0 - 15.0 g/dL   HCT 16.1 (*) 09.6 - 04.5 %   MCV 94.1  78.0 - 100.0 fL   MCH 31.5  26.0 - 34.0 pg   MCHC 33.5  30.0 - 36.0 g/dL   RDW 40.9  81.1 - 91.4 %   Platelets 286  150 - 400 K/uL  BASIC METABOLIC PANEL      Result Value Range   Sodium 142  137 - 147 mEq/L   Potassium 4.4  3.7 - 5.3 mEq/L   Chloride 101  96 - 112 mEq/L   CO2 23  19 - 32 mEq/L   Glucose, Bld 267 (*) 70 - 99 mg/dL   BUN 23  6 - 23 mg/dL   Creatinine, Ser 7.82  0.50 - 1.10 mg/dL   Calcium 9.3  8.4 - 95.6 mg/dL   GFR calc non Af Amer 54 (*) >90 mL/min   GFR calc Af Amer 62 (*) >90 mL/min  PROTIME-INR      Result Value Range   Prothrombin Time 29.1 (*)  11.6 - 15.2 seconds   INR 2.87 (*) 0.00 - 1.49  POCT I-STAT TROPONIN I      Result Value Range   Troponin i, poc 0.01  0.00 - 0.08 ng/mL   Comment 3           POCT I-STAT TROPONIN I      Result Value Range   Troponin i, poc 0.02  0.00 - 0.08 ng/mL    Comment 3           POCT I-STAT TROPONIN I      Result Value Range   Troponin i, poc 0.02  0.00 - 0.08 ng/mL   Comment 3            Imaging Review Dg Chest Port 1 View  11/10/2013   CLINICAL DATA:  Chest pain  EXAM: PORTABLE CHEST - 1 VIEW  COMPARISON:  04/12/2013  FINDINGS: Unchanged orientation of left approach dual-chamber pacer leads.  Chronic cardiomegaly. Chronic coarsening of interstitial markings diffusely, especially perihilar. No asymmetric opacity, effusion, or pneumothorax.  IMPRESSION: Pulmonary venous congestion.   Electronically Signed   By: Tiburcio PeaJonathan  Watts M.D.   On: 11/10/2013 00:41    EKG Interpretation    Date/Time:  Saturday November 09 2013 23:11:01 EST Ventricular Rate:  68 PR Interval:    QRS Duration: 152 QT Interval:  437 QTC Calculation: 465 R Axis:   -87 Text Interpretation:  Atrial fibrillation IVCD, consider atypical RBBB LVH with secondary repolarization abnormality Probable inferior infarct, recent Anterior infarct, old Lateral leads are also involved Probable Left anterior fasicular block When compared with ECG of 04/30/2012, No significant change was found Confirmed by [facility]  MD, Naelani Lafrance ([id number]) on 11/09/2013 11:20:24 PM            MDM   1. Chest pain    Chest pain of uncertain cause. Old records are reviewed and she has a history of atrial fibrillation for which she is anticoagulated with warfarin. She also has a history of nonischemic cardiomyopathy. Cardiac catheterization in September 2011 showed single vessel disease with 40% stenosis of the LAD. ECG is unchanged. Screening labs including troponin have been ordered and patient will be observed in the ED. If she remains pain-free and repeat troponin is negative, anticipate she should be able to be discharged back to her assisted living Center.  Repeat troponin increased from 0.01-0.02. While not likely to be clinically significant, postal rise so she was kept in the ED for a third troponin which is  unchanged. At this point, it is felt that she is safe to discharge with followup with her PCP.  Dione Boozeavid Shoichi Mielke, MD 11/10/13 [phone]

## 2013-11-10 LAB — POCT I-STAT TROPONIN I
TROPONIN I, POC: 0.02 ng/mL (ref 0.00–0.08)
Troponin i, poc: 0.01 ng/mL (ref 0.00–0.08)
Troponin i, poc: 0.02 ng/mL (ref 0.00–0.08)

## 2013-11-10 LAB — BASIC METABOLIC PANEL
BUN: 23 mg/dL (ref 6–23)
CO2: 23 meq/L (ref 19–32)
Calcium: 9.3 mg/dL (ref 8.4–10.5)
Chloride: 101 mEq/L (ref 96–112)
Creatinine, Ser: 0.99 mg/dL (ref 0.50–1.10)
GFR calc Af Amer: 62 mL/min — ABNORMAL LOW (ref >90)
GFR calc non Af Amer: 54 mL/min — ABNORMAL LOW (ref >90)
Glucose, Bld: 267 mg/dL — ABNORMAL HIGH (ref 70–99)
Potassium: 4.4 mEq/L (ref 3.7–5.3)
Sodium: 142 mEq/L (ref 137–147)

## 2013-11-10 LAB — CBC
HEMATOCRIT: 34.9 % — AB (ref 36.0–46.0)
HEMOGLOBIN: 11.7 g/dL — AB (ref 12.0–15.0)
MCH: 31.5 pg (ref 26.0–34.0)
MCHC: 33.5 g/dL (ref 30.0–36.0)
MCV: 94.1 fL (ref 78.0–100.0)
Platelets: 286 10*3/uL (ref 150–400)
RBC: 3.71 MIL/uL — ABNORMAL LOW (ref 3.87–5.11)
RDW: 13.6 % (ref 11.5–15.5)
WBC: 14.2 10*3/uL — ABNORMAL HIGH (ref 4.0–10.5)

## 2013-11-10 LAB — PROTIME-INR
INR: 2.87 — AB (ref 0.00–1.49)
Prothrombin Time: 29.1 seconds — ABNORMAL HIGH (ref 11.6–15.2)

## 2013-11-10 NOTE — ED Notes (Signed)
Assisted pt to bedside commode to void.  Pt staggers when initially standing "that's why I use a walker" and strikes out slapping the person closest to her with any noise.

## 2013-11-10 NOTE — ED Notes (Signed)
Pt aware we will redraw troponin for 0600.  Is unhappy about staying in ED so long but understands; daughter remains at bedside.

## 2013-11-10 NOTE — Discharge Instructions (Signed)
Chest Pain (Nonspecific) °It is often hard to give a specific diagnosis for the cause of chest pain. There is always a chance that your pain could be related to something serious, such as a heart attack or a blood clot in the lungs. You need to follow up with your caregiver for further evaluation. °CAUSES  °· Heartburn. °· Pneumonia or bronchitis. °· Anxiety or stress. °· Inflammation around your heart (pericarditis) or lung (pleuritis or pleurisy). °· A blood clot in the lung. °· A collapsed lung (pneumothorax). It can develop suddenly on its own (spontaneous pneumothorax) or from injury (trauma) to the chest. °· Shingles infection (herpes zoster virus). °The chest wall is composed of bones, muscles, and cartilage. Any of these can be the source of the pain. °· The bones can be bruised by injury. °· The muscles or cartilage can be strained by coughing or overwork. °· The cartilage can be affected by inflammation and become sore (costochondritis). °DIAGNOSIS  °Lab tests or other studies, such as X-rays, electrocardiography, stress testing, or cardiac imaging, may be needed to find the cause of your pain.  °TREATMENT  °· Treatment depends on what may be causing your chest pain. Treatment may include: °· Acid blockers for heartburn. °· Anti-inflammatory medicine. °· Pain medicine for inflammatory conditions. °· Antibiotics if an infection is present. °· You may be advised to change lifestyle habits. This includes stopping smoking and avoiding alcohol, caffeine, and chocolate. °· You may be advised to keep your head raised (elevated) when sleeping. This reduces the chance of acid going backward from your stomach into your esophagus. °· Most of the time, nonspecific chest pain will improve within 2 to 3 days with rest and mild pain medicine. °HOME CARE INSTRUCTIONS  °· If antibiotics were prescribed, take your antibiotics as directed. Finish them even if you start to feel better. °· For the next few days, avoid physical  activities that bring on chest pain. Continue physical activities as directed. °· Do not smoke. °· Avoid drinking alcohol. °· Only take over-the-counter or prescription medicine for pain, discomfort, or fever as directed by your caregiver. °· Follow your caregiver's suggestions for further testing if your chest pain does not go away. °· Keep any follow-up appointments you made. If you do not go to an appointment, you could develop lasting (chronic) problems with pain. If there is any problem keeping an appointment, you must call to reschedule. °SEEK MEDICAL CARE IF:  °· You think you are having problems from the medicine you are taking. Read your medicine instructions carefully. °· Your chest pain does not go away, even after treatment. °· You develop a rash with blisters on your chest. °SEEK IMMEDIATE MEDICAL CARE IF:  °· You have increased chest pain or pain that spreads to your arm, neck, jaw, back, or abdomen. °· You develop shortness of breath, an increasing cough, or you are coughing up blood. °· You have severe back or abdominal pain, feel nauseous, or vomit. °· You develop severe weakness, fainting, or chills. °· You have a fever. °THIS IS AN EMERGENCY. Do not wait to see if the pain will go away. Get medical help at once. Call your local emergency services (911 in [place] Do not drive yourself to the hospital. °MAKE SURE YOU:  °· Understand these instructions. °· Will watch your condition. °· Will get help right away if you are not doing well or get worse. °Document Released: 07/06/2005 Document Revised: 12/19/2011 Document Reviewed: 05/01/2008 °Exit[employer]® Patient Information ©2014 [employer]   [employer]. ° °

## 2013-11-10 NOTE — ED Notes (Signed)
0500 VS erroneous.  Please ignore

## 2013-11-10 NOTE — ED Notes (Signed)
20 ga IV removed from left AC, entire catheter in tact.  No erythema/edema at site.

## 2013-12-12 ENCOUNTER — Ambulatory Visit (INDEPENDENT_AMBULATORY_CARE_PROVIDER_SITE_OTHER): Payer: Medicare PPO

## 2013-12-12 DIAGNOSIS — Z7901 Long term (current) use of anticoagulants: Secondary | ICD-10-CM

## 2013-12-12 DIAGNOSIS — I4891 Unspecified atrial fibrillation: Secondary | ICD-10-CM

## 2013-12-12 LAB — POCT INR: INR: 2.9

## 2013-12-17 ENCOUNTER — Other Ambulatory Visit: Payer: Self-pay

## 2013-12-17 DIAGNOSIS — R0602 Shortness of breath: Secondary | ICD-10-CM

## 2013-12-17 MED ORDER — FUROSEMIDE 40 MG PO TABS: 40.0000 mg | ORAL_TABLET | Freq: Two times a day (BID) | ORAL | Status: DC

## 2013-12-18 ENCOUNTER — Encounter: Payer: Medicare PPO | Admitting: *Deleted

## 2013-12-26 ENCOUNTER — Encounter: Payer: Self-pay | Admitting: *Deleted

## 2014-01-13 ENCOUNTER — Encounter: Payer: Self-pay | Admitting: Internal Medicine

## 2014-01-13 ENCOUNTER — Ambulatory Visit (INDEPENDENT_AMBULATORY_CARE_PROVIDER_SITE_OTHER): Payer: Medicare PPO | Admitting: *Deleted

## 2014-01-13 DIAGNOSIS — Z950 Presence of cardiac pacemaker: Secondary | ICD-10-CM

## 2014-01-13 DIAGNOSIS — I498 Other specified cardiac arrhythmias: Secondary | ICD-10-CM

## 2014-01-13 LAB — MDC_IDC_ENUM_SESS_TYPE_REMOTE
Battery Impedance: 223 Ohm
Battery Remaining Longevity: 99 mo
Brady Statistic AP VS Percent: 0 %
Brady Statistic AS VP Percent: 32 %
Brady Statistic AS VS Percent: 1 %
Date Time Interrogation Session: 20150404164755
Lead Channel Impedance Value: 454 Ohm
Lead Channel Impedance Value: 767 Ohm
Lead Channel Pacing Threshold Amplitude: 0.625 V
Lead Channel Sensing Intrinsic Amplitude: 1 mV
Lead Channel Setting Pacing Amplitude: 2.5 V
MDC IDC MSMT BATTERY VOLTAGE: 2.79 V
MDC IDC MSMT LEADCHNL RV PACING THRESHOLD PULSEWIDTH: 0.4 ms
MDC IDC SET LEADCHNL RA PACING AMPLITUDE: 3.5 V
MDC IDC SET LEADCHNL RV PACING PULSEWIDTH: 0.4 ms
MDC IDC SET LEADCHNL RV SENSING SENSITIVITY: 4 mV
MDC IDC STAT BRADY AP VP PERCENT: 67 %

## 2014-01-23 ENCOUNTER — Other Ambulatory Visit: Payer: Self-pay | Admitting: *Deleted

## 2014-01-23 ENCOUNTER — Encounter: Payer: Self-pay | Admitting: *Deleted

## 2014-01-23 ENCOUNTER — Other Ambulatory Visit: Payer: Self-pay

## 2014-01-23 MED ORDER — WARFARIN SODIUM 4 MG PO TABS: ORAL_TABLET | ORAL | Status: DC

## 2014-01-23 MED ORDER — BENAZEPRIL HCL 20 MG PO TABS: 10.0000 mg | ORAL_TABLET | Freq: Every day | ORAL | Status: DC

## 2014-01-29 ENCOUNTER — Ambulatory Visit (INDEPENDENT_AMBULATORY_CARE_PROVIDER_SITE_OTHER): Payer: Medicare PPO | Admitting: *Deleted

## 2014-01-29 DIAGNOSIS — I4891 Unspecified atrial fibrillation: Secondary | ICD-10-CM

## 2014-01-29 DIAGNOSIS — Z7901 Long term (current) use of anticoagulants: Secondary | ICD-10-CM

## 2014-01-29 LAB — POCT INR: INR: 2.3

## 2014-02-11 ENCOUNTER — Emergency Department (HOSPITAL_COMMUNITY)
Admission: EM | Admit: 2014-02-11 | Discharge: 2014-02-11 | Disposition: A | Discharge status: Home / Self Care | Payer: Medicare PPO | Attending: Emergency Medicine | Admitting: Emergency Medicine

## 2014-02-11 ENCOUNTER — Emergency Department (HOSPITAL_COMMUNITY): Payer: Medicare PPO

## 2014-02-11 DIAGNOSIS — E119 Type 2 diabetes mellitus without complications: Secondary | ICD-10-CM | POA: Insufficient documentation

## 2014-02-11 DIAGNOSIS — S79929A Unspecified injury of unspecified thigh, initial encounter: Secondary | ICD-10-CM

## 2014-02-11 DIAGNOSIS — F0390 Unspecified dementia without behavioral disturbance: Secondary | ICD-10-CM | POA: Insufficient documentation

## 2014-02-11 DIAGNOSIS — Z7901 Long term (current) use of anticoagulants: Secondary | ICD-10-CM | POA: Insufficient documentation

## 2014-02-11 DIAGNOSIS — F411 Generalized anxiety disorder: Secondary | ICD-10-CM | POA: Insufficient documentation

## 2014-02-11 DIAGNOSIS — F3289 Other specified depressive episodes: Secondary | ICD-10-CM | POA: Insufficient documentation

## 2014-02-11 DIAGNOSIS — Z950 Presence of cardiac pacemaker: Secondary | ICD-10-CM | POA: Insufficient documentation

## 2014-02-11 DIAGNOSIS — IMO0002 Reserved for concepts with insufficient information to code with codable children: Secondary | ICD-10-CM | POA: Insufficient documentation

## 2014-02-11 DIAGNOSIS — W19XXXA Unspecified fall, initial encounter: Secondary | ICD-10-CM

## 2014-02-11 DIAGNOSIS — I509 Heart failure, unspecified: Secondary | ICD-10-CM | POA: Insufficient documentation

## 2014-02-11 DIAGNOSIS — F329 Major depressive disorder, single episode, unspecified: Secondary | ICD-10-CM | POA: Insufficient documentation

## 2014-02-11 DIAGNOSIS — I252 Old myocardial infarction: Secondary | ICD-10-CM | POA: Insufficient documentation

## 2014-02-11 DIAGNOSIS — I2510 Atherosclerotic heart disease of native coronary artery without angina pectoris: Secondary | ICD-10-CM | POA: Insufficient documentation

## 2014-02-11 DIAGNOSIS — M549 Dorsalgia, unspecified: Secondary | ICD-10-CM

## 2014-02-11 DIAGNOSIS — Y9389 Activity, other specified: Secondary | ICD-10-CM | POA: Insufficient documentation

## 2014-02-11 DIAGNOSIS — Z9861 Coronary angioplasty status: Secondary | ICD-10-CM | POA: Insufficient documentation

## 2014-02-11 DIAGNOSIS — I4891 Unspecified atrial fibrillation: Secondary | ICD-10-CM | POA: Insufficient documentation

## 2014-02-11 DIAGNOSIS — Z79899 Other long term (current) drug therapy: Secondary | ICD-10-CM | POA: Insufficient documentation

## 2014-02-11 DIAGNOSIS — M129 Arthropathy, unspecified: Secondary | ICD-10-CM | POA: Insufficient documentation

## 2014-02-11 DIAGNOSIS — Y92009 Unspecified place in unspecified non-institutional (private) residence as the place of occurrence of the external cause: Secondary | ICD-10-CM | POA: Insufficient documentation

## 2014-02-11 DIAGNOSIS — K219 Gastro-esophageal reflux disease without esophagitis: Secondary | ICD-10-CM | POA: Insufficient documentation

## 2014-02-11 DIAGNOSIS — R296 Repeated falls: Secondary | ICD-10-CM | POA: Insufficient documentation

## 2014-02-11 DIAGNOSIS — E7800 Pure hypercholesterolemia, unspecified: Secondary | ICD-10-CM | POA: Insufficient documentation

## 2014-02-11 DIAGNOSIS — S79919A Unspecified injury of unspecified hip, initial encounter: Secondary | ICD-10-CM | POA: Insufficient documentation

## 2014-02-11 MED ORDER — ACETAMINOPHEN-CODEINE #3 300-30 MG PO TABS: 1.0000 | ORAL_TABLET | Freq: Four times a day (QID) | ORAL | Status: DC | PRN

## 2014-02-11 NOTE — ED Notes (Signed)
Bed: WA17 Expected date:  Expected time:  Means of arrival:  Comments: EMS 

## 2014-02-11 NOTE — ED Notes (Addendum)
Per EMS, pt is from [facility] assisted living. Pt states she was getting out of her chair when her left leg gave out, causing her to fall. Pt has chronic pain in her legs, and states that she has 4/10 pain when bearing weight in her left leg and hip. Pt denies LOC. Pt alert and oriented.

## 2014-02-11 NOTE — Discharge Instructions (Signed)
If you need a pain medicine, use the pain prescription given above, Tylenol #3. This does contain codeine and may make you feel nauseated. Your x-ray shows no signs of pelvic or hip fracture.  Please call your doctor for a followup appointment within 24-48 hours. When you talk to your doctor please let them know that you were seen in the emergency department and have them acquire all of your records so that they can discuss the findings with you and formulate a treatment plan to fully care for your new and ongoing problems.

## 2014-02-11 NOTE — ED Provider Notes (Signed)
CSN: [id number]     Arrival date & time 02/11/14  1735 History   First MD Initiated Contact with Patient 02/11/14 1743     Chief Complaint  Patient presents with  . Fall     (Consider location/radiation/quality/duration/timing/severity/associated sxs/prior Treatment) HPI Comments: Pt is an elderly female with hx of DM and Htn, presents after having a fall at home - she lives at [facility] - Pt states that she usually walks with a walker - she tried to get up without one and lost her balance falling onto her R lower back and hip - this was acute in onset, sx are mild, persistent, and she states that she doesn't want to be here as she doesn't think she is injured.  She had a mild head injury with this but no LOC, no HA and no c/o at this time with neck or head.  Patient is a 79 y.o. female presenting with fall. The history is provided by the patient and the EMS personnel.  Fall    Past Medical History  Diagnosis Date  . HYPERCHOLESTEROLEMIA   . ANXIETY   . Takotsubo cardiomyopathy     2006  . CARDIOMYOPATHY     Nonischemic; nonobstructive CAD, EF 35-40% by cath 07/2010, EF 25% by echo 07/2010  . Atrial fibrillation     on coumadin  . SICK SINUS SYNDROME     s/p Medtronic PPM '04, generator change '12  . CHF   . GERD   . Diabetes mellitus   . Coronary artery disease     non obstructive  . Myocardial infarction   . Hypertension   . Pacemaker     Medtronic  . Shortness of breath   . Arthritis   . Depression   . DEMENTIA    Past Surgical History  Procedure Laterality Date  . Coronary angioplasty    . Pacemaker insertion  2004, 2012    Medtronic   . Cervical spine surgery    . Back surgery    . Radical hysterectomy     Family History  Problem Relation Age of Onset  . Other      no known CAD   History  Substance Use Topics  . Smoking status: Never Smoker   . Smokeless tobacco: Never Used  . Alcohol Use: No   OB History   Grav Para Term Preterm Abortions TAB  SAB Ect Mult Living                 Review of Systems  All other systems reviewed and are negative.     Allergies  Review of patient's allergies indicates no known allergies.  Home Medications   Prior to Admission medications   Medication Sig Start Date End Date Taking? Authorizing Provider  benazepril (LOTENSIN) 20 MG tablet Take 0.5 tablets (10 mg total) by mouth daily. 01/23/14   Wendall StadePeter C Nishan, MD  clonazePAM (KLONOPIN) 0.5 MG tablet Take 0.5 mg by mouth 2 (two) times daily.    Historical Provider, MD  escitalopram (LEXAPRO) 10 MG tablet Take 10 mg by mouth daily.    Historical Provider, MD  furosemide (LASIX) 40 MG tablet Take 1 tablet (40 mg total) by mouth 2 (two) times daily. 12/17/13 12/17/14  Wendall StadePeter C Nishan, MD  glipiZIDE-metformin (METAGLIP) 5-500 MG per tablet Take 1 tablet by mouth 2 (two) times daily before a meal.      Historical Provider, MD  memantine (NAMENDA) 10 MG tablet Take 10 mg by mouth  2 (two) times daily.     Historical Provider, MD  metoprolol succinate (TOPROL-XL) 50 MG 24 hr tablet Take 1 tablet (50 mg total) by mouth daily. 10/23/13   Wendall Stade, MD  nitroGLYCERIN (NITROSTAT) 0.4 MG SL tablet Place 0.4 mg under the tongue every 5 (five) minutes as needed. For chest pain    Historical Provider, MD  nystatin-triamcinolone (MYCOLOG II) cream Apply 1 application topically 2 (two) times daily.    Historical Provider, MD  spironolactone (ALDACTONE) 25 MG tablet Take 0.5 tablets (12.5 mg total) by mouth daily. 04/17/13   Wendall Stade, MD  warfarin (COUMADIN) 4 MG tablet 1/2 pill everyday except 1 pill on Mondays and Wednesdays or as directed by coumadin clinic 01/23/14   Wendall Stade, MD   BP 114/79  Pulse 79  Temp(Src) 98.5 F (36.9 C) (Oral)  Resp 18  SpO2 99% Physical Exam  Nursing note and vitals reviewed. Constitutional: She appears well-developed and well-nourished. No distress.  HENT:  Head: Normocephalic and atraumatic.  Mouth/Throat: Oropharynx  is clear and moist. No oropharyngeal exudate.  Eyes: Conjunctivae and EOM are normal. Pupils are equal, round, and reactive to light. Right eye exhibits no discharge. Left eye exhibits no discharge. No scleral icterus.  Neck: Normal range of motion. Neck supple. No JVD present. No thyromegaly present.  Cardiovascular: Normal rate, regular rhythm, normal heart sounds and intact distal pulses.  Exam reveals no gallop and no friction rub.   No murmur heard. Pulmonary/Chest: Effort normal and breath sounds normal. No respiratory distress. She has no wheezes. She has no rales.  Abdominal: Soft. Bowel sounds are normal. She exhibits no distension and no mass. There is no tenderness.  Musculoskeletal: Normal range of motion. She exhibits tenderness ( pain with manipulation of the RLE at the hip - no leg length discrepancy, no deformity, compartments softs, joints supple except for bilateral knees (chronic) and R hip (fall).). She exhibits no edema.  Lymphadenopathy:    She has no cervical adenopathy.  Neurological: She is alert. Coordination normal.  Skin: Skin is warm and dry. No rash noted. No erythema.  Psychiatric: She has a normal mood and affect. Her behavior is normal.    ED Course  Procedures (including critical care time) Labs Review Labs Reviewed - No data to display  Imaging Review Dg Hip Complete Right  02/11/2014   CLINICAL DATA:  Right hip pain secondary to a fall today.  EXAM: RIGHT HIP - COMPLETE 2+ VIEW  COMPARISON:  None.  FINDINGS: There is no fracture or dislocation. There are minimal degenerative changes of the superior lateral aspect of the right acetabulum. Pelvic bones are intact.  IMPRESSION: No acute abnormalities.   Electronically Signed   By: Geanie Cooley M.D.   On: 02/11/2014 18:30      MDM   Final diagnoses:  Fall  Back pain    Pt is able to straight leg raise bilaterally - r/o hip frx - no signs of head injury and neuro intact.  VS normal.  Xray negative, pt  stable appearing, no hard signs of hip frx mixed with no frx seen on xray, VS normal, no head inj, stable for d/c.  Meds given in ED:  Medications - No data to display  New Prescriptions   ACETAMINOPHEN-CODEINE (TYLENOL #3) 300-30 MG PER TABLET    Take 1-2 tablets by mouth every 6 (six) hours as needed for moderate pain.      Vida Roller, MD 02/11/14  1837 

## 2014-03-04 ENCOUNTER — Encounter: Payer: Self-pay | Admitting: Internal Medicine

## 2014-03-05 ENCOUNTER — Ambulatory Visit (INDEPENDENT_AMBULATORY_CARE_PROVIDER_SITE_OTHER): Payer: Medicare PPO | Admitting: Cardiovascular Disease

## 2014-03-05 ENCOUNTER — Ambulatory Visit (INDEPENDENT_AMBULATORY_CARE_PROVIDER_SITE_OTHER): Payer: Medicare PPO | Admitting: *Deleted

## 2014-03-05 VITALS — BP 126/68 | HR 80 | Ht 65.0 in | Wt 203.8 lb

## 2014-03-05 DIAGNOSIS — I10 Essential (primary) hypertension: Secondary | ICD-10-CM

## 2014-03-05 DIAGNOSIS — I4891 Unspecified atrial fibrillation: Secondary | ICD-10-CM

## 2014-03-05 DIAGNOSIS — E7800 Pure hypercholesterolemia, unspecified: Secondary | ICD-10-CM

## 2014-03-05 DIAGNOSIS — Z7901 Long term (current) use of anticoagulants: Secondary | ICD-10-CM

## 2014-03-05 DIAGNOSIS — I2510 Atherosclerotic heart disease of native coronary artery without angina pectoris: Secondary | ICD-10-CM

## 2014-03-05 DIAGNOSIS — Z950 Presence of cardiac pacemaker: Secondary | ICD-10-CM

## 2014-03-05 LAB — POCT INR: INR: 2.1

## 2014-03-05 MED ORDER — BENAZEPRIL HCL 20 MG PO TABS: 10.0000 mg | ORAL_TABLET | Freq: Every day | ORAL | Status: DC

## 2014-03-05 NOTE — Patient Instructions (Signed)
Your physician wants you to follow-up in:  6 MONTHS WITH DR NISHAN  You will receive a reminder letter in the mail two months in advance. If you don't receive a letter, please call our office to schedule the follow-up appointment. Your physician recommends that you continue on your current medications as directed. Please refer to the Current Medication list given to you today. 

## 2014-03-05 NOTE — Progress Notes (Signed)
Patient ID: [name], female   DOB: Dec 27, 1934, 79 y.o.   MRN: [id number] Ms. Terrazas is a 79 yo Caucasian female with PMHx significant for CAD (nonobsructive CAD by cath 2011; EF 35-40%), Takatsubo CM (2006), NICM (EF 25% per 10/11 echo), atrial fibrillation (on Coumadin), SSS (s/p PPM 2004), HTN, HL, type 2 DM and anxiety Hospitalization 01/31/12 for atypical chest pain R/O. Resides at assisted living now. Dementia and psychiatric illness makes her hard to care for per daughter. LE edema from varicose veings. Long discussion with her about not dying from her heart. Also discussed the fact that her pacer cannot come out She has fallen a few times Discussed possibly needing to stop coumadin in future On klonipan instead xanax and this may be contributing   Not exercising as much Family having a difficult time with her Not falling that much    ROS: Denies fever, malais, weight loss, blurry vision, decreased visual acuity, cough, sputum, SOB, hemoptysis, pleuritic pain, palpitaitons, heartburn, abdominal pain, melena, lower extremity edema, claudication, or rash.  All other systems reviewed and negative  General: Excitable  Healthy:  appears stated age HEENT: normal Neck supple with no adenopathy JVP normal no bruits no thyromegaly Lungs clear with no wheezing and good diaphragmatic motion Heart:  S1/S2 no murmur, no rub, gallop or click PMI normal Abdomen: benighn, BS positve, no tenderness, no AAA no bruit.  No HSM or HJR Distal pulses intact with no bruits No edema Neuro non-focal Skin warm and dry No muscular weakness   Current Outpatient Prescriptions  Medication Sig Dispense Refill  . acetaminophen-codeine (TYLENOL #3) 300-30 MG per tablet Take 1-2 tablets by mouth every 6 (six) hours as needed for moderate pain.  15 tablet  0  . benazepril (LOTENSIN) 20 MG tablet Take 0.5 tablets (10 mg total) by mouth daily.  30 tablet  6  . clonazePAM (KLONOPIN) 0.5 MG tablet Take 0.5 mg by  mouth 2 (two) times daily.      Marland Kitchen escitalopram (LEXAPRO) 10 MG tablet Take 10 mg by mouth daily.      . furosemide (LASIX) 40 MG tablet Take 1 tablet (40 mg total) by mouth 2 (two) times daily.  60 tablet  3  . glipiZIDE (GLUCOTROL) 5 MG tablet       . glipiZIDE-metformin (METAGLIP) 5-500 MG per tablet Take 1 tablet by mouth 2 (two) times daily before a meal.        . memantine (NAMENDA) 10 MG tablet Take 10 mg by mouth 2 (two) times daily.       . metFORMIN (GLUCOPHAGE) 500 MG tablet       . metoprolol succinate (TOPROL-XL) 50 MG 24 hr tablet Take 1 tablet (50 mg total) by mouth daily.  30 tablet  3  . nitroGLYCERIN (NITROSTAT) 0.4 MG SL tablet Place 0.4 mg under the tongue every 5 (five) minutes as needed. For chest pain      . nystatin-triamcinolone (MYCOLOG II) cream Apply 1 application topically 2 (two) times daily.      Marland Kitchen spironolactone (ALDACTONE) 25 MG tablet Take 0.5 tablets (12.5 mg total) by mouth daily.  45 tablet  3  . warfarin (COUMADIN) 4 MG tablet 1/2 pill everyday except 1 pill on Mondays and Wednesdays or as directed by coumadin clinic  30 tablet  3   No current facility-administered medications for this visit.    Allergies  Review of patient's allergies indicates no known allergies.  Electrocardiogram:  afib v pacing  Assessment and Plan

## 2014-03-05 NOTE — Assessment & Plan Note (Signed)
Stable with no angina and good activity level.  Continue medical Rx  

## 2014-03-05 NOTE — Assessment & Plan Note (Signed)
Cholesterol is at goal.  Continue current dose of statin and diet Rx.  No myalgias or side effects.  F/U  LFT's in 6 months. No results found for this basename: LDLCALC             

## 2014-03-05 NOTE — Assessment & Plan Note (Signed)
Anticoagulation and back up pacing Rate control is fine.  May need to stop coumadin in future for more frequent falls

## 2014-03-05 NOTE — Assessment & Plan Note (Signed)
Normal function  F/U EP  Phone interogaton from assisted living [place] is fine

## 2014-03-05 NOTE — Assessment & Plan Note (Signed)
Well controlled.  Continue current medications and low sodium Dash type diet.    

## 2014-03-25 ENCOUNTER — Other Ambulatory Visit: Payer: Self-pay | Admitting: *Deleted

## 2014-03-25 MED ORDER — METOPROLOL SUCCINATE ER 50 MG PO TB24: 50.0000 mg | ORAL_TABLET | Freq: Every day | ORAL | Status: DC

## 2014-03-28 ENCOUNTER — Encounter: Payer: Self-pay | Admitting: *Deleted

## 2014-04-16 ENCOUNTER — Ambulatory Visit (INDEPENDENT_AMBULATORY_CARE_PROVIDER_SITE_OTHER): Payer: Medicare PPO | Admitting: *Deleted

## 2014-04-16 DIAGNOSIS — I4891 Unspecified atrial fibrillation: Secondary | ICD-10-CM

## 2014-04-16 DIAGNOSIS — Z7901 Long term (current) use of anticoagulants: Secondary | ICD-10-CM

## 2014-04-16 LAB — POCT INR: INR: 3.5

## 2014-04-30 ENCOUNTER — Encounter: Payer: Self-pay | Admitting: *Deleted

## 2014-05-14 ENCOUNTER — Ambulatory Visit (INDEPENDENT_AMBULATORY_CARE_PROVIDER_SITE_OTHER): Payer: Medicare PPO | Admitting: Pharmacist

## 2014-05-14 DIAGNOSIS — Z7901 Long term (current) use of anticoagulants: Secondary | ICD-10-CM

## 2014-05-14 DIAGNOSIS — I4891 Unspecified atrial fibrillation: Secondary | ICD-10-CM

## 2014-05-14 LAB — POCT INR: INR: 2

## 2014-06-09 ENCOUNTER — Other Ambulatory Visit: Payer: Self-pay | Admitting: *Deleted

## 2014-06-09 DIAGNOSIS — R0602 Shortness of breath: Secondary | ICD-10-CM

## 2014-06-09 MED ORDER — FUROSEMIDE 40 MG PO TABS: 40.0000 mg | ORAL_TABLET | Freq: Two times a day (BID) | ORAL | Status: DC

## 2014-06-17 ENCOUNTER — Encounter: Payer: Self-pay | Admitting: Internal Medicine

## 2014-06-17 ENCOUNTER — Ambulatory Visit (INDEPENDENT_AMBULATORY_CARE_PROVIDER_SITE_OTHER): Payer: Medicare PPO | Admitting: Pharmacist

## 2014-06-17 ENCOUNTER — Ambulatory Visit (INDEPENDENT_AMBULATORY_CARE_PROVIDER_SITE_OTHER): Payer: Medicare PPO | Admitting: Internal Medicine

## 2014-06-17 VITALS — BP 128/68 | HR 66 | Ht 65.0 in | Wt 199.6 lb

## 2014-06-17 DIAGNOSIS — I480 Paroxysmal atrial fibrillation: Secondary | ICD-10-CM

## 2014-06-17 DIAGNOSIS — I4891 Unspecified atrial fibrillation: Secondary | ICD-10-CM

## 2014-06-17 DIAGNOSIS — I10 Essential (primary) hypertension: Secondary | ICD-10-CM

## 2014-06-17 DIAGNOSIS — Z7901 Long term (current) use of anticoagulants: Secondary | ICD-10-CM

## 2014-06-17 DIAGNOSIS — Z950 Presence of cardiac pacemaker: Secondary | ICD-10-CM

## 2014-06-17 LAB — MDC_IDC_ENUM_SESS_TYPE_INCLINIC
Battery Impedance: 272 Ohm
Battery Remaining Longevity: 7.5
Battery Voltage: 2.79 V
Brady Statistic AS VP Percent: 33.5 %
Lead Channel Impedance Value: 707 Ohm
Lead Channel Setting Pacing Amplitude: 2.5 V
Lead Channel Setting Pacing Amplitude: 3.5 V
Lead Channel Setting Pacing Pulse Width: 0.4 ms
Lead Channel Setting Sensing Sensitivity: 4 mV
MDC IDC MSMT LEADCHNL RA IMPEDANCE VALUE: 403 Ohm
MDC IDC MSMT LEADCHNL RV PACING THRESHOLD AMPLITUDE: 0.625 V
MDC IDC MSMT LEADCHNL RV PACING THRESHOLD PULSEWIDTH: 0.4 ms
MDC IDC STAT BRADY AP VP PERCENT: 65.8 %
MDC IDC STAT BRADY AP VS PERCENT: 0.1 % — AB
MDC IDC STAT BRADY AS VS PERCENT: 0.7 %

## 2014-06-17 LAB — POCT INR: INR: 1.5

## 2014-06-17 NOTE — Patient Instructions (Signed)

## 2014-06-17 NOTE — Progress Notes (Signed)
HPI [name] returns today for followup. She is a pleasant 79 yo woman with a h/o symptomatic bradycardia, s/p PPM. She also has HTN and problems with anxiety. In the interim, she denies sob or syncope. She does have muscle aches in the chest and back, thought to be musculoskeletal pain. No other symptoms.  Allergies  Allergen Reactions  . Penicillins Rash    Happened many years ago     Current Outpatient Prescriptions  Medication Sig Dispense Refill  . acetaminophen-codeine (TYLENOL #3) 300-30 MG per tablet Take 1-2 tablets by mouth every 6 (six) hours as needed for moderate pain.  15 tablet  0  . benazepril (LOTENSIN) 20 MG tablet Take 0.5 tablets (10 mg total) by mouth daily.  30 tablet  6  . clonazePAM (KLONOPIN) 0.5 MG tablet Take 0.5 mg by mouth daily.       Marland Kitchen escitalopram (LEXAPRO) 10 MG tablet Take 10 mg by mouth daily.      . furosemide (LASIX) 40 MG tablet Take 1/2 tablet in the morning and 1 tablet in the evening      . glipiZIDE (GLUCOTROL) 5 MG tablet Take 5 mg by mouth 2 (two) times daily before a meal.       . memantine (NAMENDA) 10 MG tablet Take 10 mg by mouth 2 (two) times daily.       . metFORMIN (GLUCOPHAGE) 500 MG tablet Take 500 mg by mouth 2 (two) times daily with a meal.       . metoprolol succinate (TOPROL-XL) 50 MG 24 hr tablet Take 1 tablet (50 mg total) by mouth daily.  30 tablet  3  . nitroGLYCERIN (NITROSTAT) 0.4 MG SL tablet Place 0.4 mg under the tongue every 5 (five) minutes as needed. For chest pain      . spironolactone (ALDACTONE) 25 MG tablet Take 0.5 tablets (12.5 mg total) by mouth daily.  45 tablet  3  . warfarin (COUMADIN) 4 MG tablet 1/2 pill everyday except 1 pill on Mondays and Wednesdays or as directed by coumadin clinic  30 tablet  3   No current facility-administered medications for this visit.     Past Medical History  Diagnosis Date  . HYPERCHOLESTEROLEMIA   . ANXIETY   . Takotsubo cardiomyopathy     2006  . CARDIOMYOPATHY    Nonischemic; nonobstructive CAD, EF 35-40% by cath 07/2010, EF 25% by echo 07/2010  . Atrial fibrillation     on coumadin  . SICK SINUS SYNDROME     s/p Medtronic PPM '04, generator change '12  . CHF   . GERD   . Diabetes mellitus   . Coronary artery disease     non obstructive  . Myocardial infarction   . Hypertension   . Pacemaker     Medtronic  . Shortness of breath   . Arthritis   . Depression   . DEMENTIA     ROS:   All systems reviewed and negative except as noted in the HPI.   Past Surgical History  Procedure Laterality Date  . Coronary angioplasty    . Pacemaker insertion  2004, 2012    Medtronic   . Cervical spine surgery    . Back surgery    . Radical hysterectomy       Family History  Problem Relation Age of Onset  . Other      no known CAD     History   Social History  . Marital Status: Widowed  Spouse Name: N/A    Number of Children: N/A  . Years of Education: N/A   Occupational History  . Not on file.   Social History Main Topics  . Smoking status: Never Smoker   . Smokeless tobacco: Never Used  . Alcohol Use: No  . Drug Use: No  . Sexual Activity: No   Other Topics Concern  . Not on file   Social History Narrative  . No narrative on file     BP 128/68  Pulse 66  Ht  (1.651 m)  Wt 199 lb 9.6 oz (90.538 kg)  BMI 33.22 kg/m2  Physical Exam:  Well appearing elderly woman, NAD HEENT: Unremarkable Neck:  No JVD, no thyromegally Lungs:  Clear with no wheezes HEART:  Regular rate rhythm, no murmurs, no rubs, no clicks Abd:  soft, positive bowel sounds, no organomegally, no rebound, no guarding Ext:  2 plus pulses, no edema, no cyanosis, no clubbing Skin:  No rashes no nodules Neuro:  CN II through XII intact, motor grossly intact  DEVICE  Normal device function.  See PaceArt for details.   Assess/Plan:

## 2014-06-17 NOTE — Assessment & Plan Note (Signed)
Her blood pressure is well controlled. No change in meds. 

## 2014-06-17 NOTE — Assessment & Plan Note (Signed)
Her medtronic DDD pm is working normally. Will follow.

## 2014-06-17 NOTE — Assessment & Plan Note (Signed)
Her ventricular rate is well controlled. She is pacing in the ventricle 99% of the time.

## 2014-06-18 ENCOUNTER — Other Ambulatory Visit: Payer: Self-pay

## 2014-06-18 MED ORDER — SPIRONOLACTONE 25 MG PO TABS: 12.5000 mg | ORAL_TABLET | Freq: Every day | ORAL | Status: DC

## 2014-07-09 ENCOUNTER — Ambulatory Visit (INDEPENDENT_AMBULATORY_CARE_PROVIDER_SITE_OTHER): Payer: Medicare PPO | Admitting: Pharmacist

## 2014-07-09 DIAGNOSIS — Z7901 Long term (current) use of anticoagulants: Secondary | ICD-10-CM

## 2014-07-09 DIAGNOSIS — I4891 Unspecified atrial fibrillation: Secondary | ICD-10-CM

## 2014-07-09 LAB — POCT INR: INR: 1.8

## 2014-07-16 ENCOUNTER — Other Ambulatory Visit: Payer: Self-pay | Admitting: Cardiovascular Disease

## 2014-07-31 ENCOUNTER — Ambulatory Visit (INDEPENDENT_AMBULATORY_CARE_PROVIDER_SITE_OTHER): Payer: Medicare PPO | Admitting: *Deleted

## 2014-07-31 DIAGNOSIS — Z7901 Long term (current) use of anticoagulants: Secondary | ICD-10-CM

## 2014-07-31 DIAGNOSIS — I4891 Unspecified atrial fibrillation: Secondary | ICD-10-CM

## 2014-07-31 DIAGNOSIS — Z23 Encounter for immunization: Secondary | ICD-10-CM

## 2014-07-31 LAB — POCT INR: INR: 2.1

## 2014-08-16 ENCOUNTER — Other Ambulatory Visit: Payer: Self-pay | Admitting: Cardiovascular Disease

## 2014-08-28 ENCOUNTER — Ambulatory Visit (INDEPENDENT_AMBULATORY_CARE_PROVIDER_SITE_OTHER): Payer: Medicare PPO | Admitting: Surgery

## 2014-08-28 DIAGNOSIS — I4891 Unspecified atrial fibrillation: Secondary | ICD-10-CM

## 2014-08-28 DIAGNOSIS — Z7901 Long term (current) use of anticoagulants: Secondary | ICD-10-CM

## 2014-08-28 LAB — POCT INR: INR: 2.7

## 2014-09-12 ENCOUNTER — Other Ambulatory Visit: Payer: Self-pay

## 2014-09-12 DIAGNOSIS — I10 Essential (primary) hypertension: Secondary | ICD-10-CM

## 2014-09-12 MED ORDER — FUROSEMIDE 40 MG PO TABS: ORAL_TABLET | ORAL | Status: DC

## 2014-09-18 ENCOUNTER — Ambulatory Visit (INDEPENDENT_AMBULATORY_CARE_PROVIDER_SITE_OTHER): Payer: Medicare PPO | Admitting: *Deleted

## 2014-09-18 ENCOUNTER — Encounter: Payer: Self-pay | Admitting: Internal Medicine

## 2014-09-18 DIAGNOSIS — I480 Paroxysmal atrial fibrillation: Secondary | ICD-10-CM

## 2014-09-18 LAB — MDC_IDC_ENUM_SESS_TYPE_REMOTE
Battery Impedance: 297 Ohm
Battery Remaining Longevity: 91 mo
Brady Statistic AP VP Percent: 67 %
Brady Statistic AP VS Percent: 0 %
Brady Statistic AS VP Percent: 33 %
Brady Statistic AS VS Percent: 0 %
Date Time Interrogation Session: 20151210162033
Lead Channel Pacing Threshold Pulse Width: 0.4 ms
Lead Channel Sensing Intrinsic Amplitude: 1 mV
Lead Channel Setting Pacing Amplitude: 3.5 V
Lead Channel Setting Sensing Sensitivity: 4 mV
MDC IDC MSMT BATTERY VOLTAGE: 2.79 V
MDC IDC MSMT LEADCHNL RA IMPEDANCE VALUE: 373 Ohm
MDC IDC MSMT LEADCHNL RV IMPEDANCE VALUE: 705 Ohm
MDC IDC MSMT LEADCHNL RV PACING THRESHOLD AMPLITUDE: 0.5 V
MDC IDC SET LEADCHNL RV PACING AMPLITUDE: 2.5 V
MDC IDC SET LEADCHNL RV PACING PULSEWIDTH: 0.4 ms

## 2014-09-18 NOTE — Progress Notes (Signed)
Remote pacemaker transmission.   

## 2014-09-26 ENCOUNTER — Encounter: Payer: Self-pay | Admitting: Cardiology

## 2014-10-01 ENCOUNTER — Ambulatory Visit (INDEPENDENT_AMBULATORY_CARE_PROVIDER_SITE_OTHER): Payer: Medicare PPO | Admitting: *Deleted

## 2014-10-01 DIAGNOSIS — Z7901 Long term (current) use of anticoagulants: Secondary | ICD-10-CM

## 2014-10-01 DIAGNOSIS — I4891 Unspecified atrial fibrillation: Secondary | ICD-10-CM

## 2014-10-01 LAB — POCT INR: INR: 3

## 2014-10-07 ENCOUNTER — Other Ambulatory Visit: Payer: Self-pay | Admitting: Cardiovascular Disease

## 2014-10-29 NOTE — Progress Notes (Signed)
Patient ID: [name], female   DOB: 1934/11/26, 80 y.o.   MRN: [id number] [name] is a  80 y.o.  Caucasian female with PMHx significant for CAD (nonobsructive CAD by cath 2011; EF 35-40%), Takatsubo CM (2006), NICM (EF 25% per 10/11 echo), atrial fibrillation (on Coumadin), SSS (s/p PPM 2004), HTN, HL, type 2 DM and anxiety Hospitalization 01/31/12 for atypical chest pain R/O. Resides at assisted living now. Dementia and psychiatric illness makes her hard to care for per daughter. LE edema from varicose veings. Long discussion with her about not dying from her heart. Also discussed the fact that her pacer cannot come out She has fallen a few times Discussed possibly needing to stop coumadin in future On klonipan instead xanax and this may be contributing   Not exercising as much Family having a difficult time with her Not falling that much  Remote pacer check 12/15 reviewed and normal   Only one bad fall since last visit    ROS: Denies fever, malais, weight loss, blurry vision, decreased visual acuity, cough, sputum, SOB, hemoptysis, pleuritic pain, palpitaitons, heartburn, abdominal pain, melena, lower extremity edema, claudication, or rash.  All other systems reviewed and negative  General: Excitable  Healthy:  appears stated age HEENT: normal Neck supple with no adenopathy JVP normal no bruits no thyromegaly Lungs clear with no wheezing and good diaphragmatic motion Heart:  S1/S2 no murmur, no rub, gallop or click PMI normal Abdomen: benighn, BS positve, no tenderness, no AAA no bruit.  No HSM or HJR Distal pulses intact with no bruits No edema Neuro non-focal Skin warm and dry No muscular weakness   Current Outpatient Prescriptions  Medication Sig Dispense Refill  . acetaminophen-codeine (TYLENOL #3) 300-30 MG per tablet Take 1-2 tablets by mouth every 6 (six) hours as needed for moderate pain. 15 tablet 0  . benazepril (LOTENSIN) 20 MG tablet Take 0.5 tablets (10 mg total)  by mouth daily. 30 tablet 6  . clonazePAM (KLONOPIN) 0.5 MG tablet Take 0.5 mg by mouth daily.     Marland Kitchen escitalopram (LEXAPRO) 10 MG tablet Take 10 mg by mouth daily.    . furosemide (LASIX) 40 MG tablet Take 1/2 tablet in the morning and 1 tablet in the evening 30 tablet 6  . glipiZIDE (GLUCOTROL) 5 MG tablet Take 5 mg by mouth 2 (two) times daily before a meal.     . memantine (NAMENDA) 10 MG tablet Take 10 mg by mouth 2 (two) times daily.     . metFORMIN (GLUCOPHAGE) 500 MG tablet Take 500 mg by mouth 2 (two) times daily with a meal.     . metoprolol succinate (TOPROL-XL) 50 MG 24 hr tablet TAKE 1 TABLET BY MOUTH ONCE DAILY 30 tablet 11  . nitroGLYCERIN (NITROSTAT) 0.4 MG SL tablet Place 0.4 mg under the tongue every 5 (five) minutes as needed. For chest pain    . spironolactone (ALDACTONE) 25 MG tablet TAKE 1/2 TABLET BY MOUTH DAILY 45 tablet 0  . warfarin (COUMADIN) 4 MG tablet Take as directed by Coumadin clinic 30 tablet 3   No current facility-administered medications for this visit.    Allergies  Penicillins  Electrocardiogram:  afib v pacing  02/21/14   10/30/13 no change afib with v pacing rate 74   Assessment and Plan

## 2014-10-30 ENCOUNTER — Encounter: Payer: Self-pay | Admitting: Cardiovascular Disease

## 2014-10-30 ENCOUNTER — Ambulatory Visit (INDEPENDENT_AMBULATORY_CARE_PROVIDER_SITE_OTHER): Payer: Medicare PPO | Admitting: *Deleted

## 2014-10-30 ENCOUNTER — Ambulatory Visit (INDEPENDENT_AMBULATORY_CARE_PROVIDER_SITE_OTHER): Payer: Medicare PPO | Admitting: Cardiovascular Disease

## 2014-10-30 VITALS — BP 118/60 | HR 74 | Ht 65.0 in | Wt 186.1 lb

## 2014-10-30 DIAGNOSIS — I4819 Other persistent atrial fibrillation: Secondary | ICD-10-CM

## 2014-10-30 DIAGNOSIS — I495 Sick sinus syndrome: Secondary | ICD-10-CM

## 2014-10-30 DIAGNOSIS — E119 Type 2 diabetes mellitus without complications: Secondary | ICD-10-CM

## 2014-10-30 DIAGNOSIS — I10 Essential (primary) hypertension: Secondary | ICD-10-CM

## 2014-10-30 DIAGNOSIS — I4891 Unspecified atrial fibrillation: Secondary | ICD-10-CM

## 2014-10-30 DIAGNOSIS — I481 Persistent atrial fibrillation: Secondary | ICD-10-CM

## 2014-10-30 DIAGNOSIS — E7800 Pure hypercholesterolemia, unspecified: Secondary | ICD-10-CM

## 2014-10-30 DIAGNOSIS — I2510 Atherosclerotic heart disease of native coronary artery without angina pectoris: Secondary | ICD-10-CM

## 2014-10-30 DIAGNOSIS — Z7901 Long term (current) use of anticoagulants: Secondary | ICD-10-CM

## 2014-10-30 LAB — POCT INR: INR: 1.9

## 2014-10-30 NOTE — Assessment & Plan Note (Signed)
Normal pacer function f/u EP

## 2014-10-30 NOTE — Assessment & Plan Note (Signed)
Stable with no angina and good activity level.  Continue medical Rx  

## 2014-10-30 NOTE — Patient Instructions (Signed)
Your physician wants you to follow-up in:  6 MONTHS WITH DR NISHAN  You will receive a reminder letter in the mail two months in advance. If you don't receive a letter, please call our office to schedule the follow-up appointment. Your physician recommends that you continue on your current medications as directed. Please refer to the Current Medication list given to you today. 

## 2014-10-30 NOTE — Assessment & Plan Note (Signed)
Cholesterol is at goal.  Continue current dose of statin and diet Rx.  No myalgias or side effects.  F/U  LFT's in 6 months. No results found for: LDLCALC           

## 2014-10-30 NOTE — Assessment & Plan Note (Signed)
Discussed low carb diet.  Target hemoglobin A1c is 6.5 or less.  Continue current medications.  

## 2014-10-30 NOTE — Assessment & Plan Note (Signed)
Continue coumadin for now  No NOAC due to issues with reversing and her history of falls

## 2014-11-27 ENCOUNTER — Ambulatory Visit (INDEPENDENT_AMBULATORY_CARE_PROVIDER_SITE_OTHER): Payer: Medicare PPO | Admitting: *Deleted

## 2014-11-27 DIAGNOSIS — I4891 Unspecified atrial fibrillation: Secondary | ICD-10-CM

## 2014-11-27 DIAGNOSIS — Z7901 Long term (current) use of anticoagulants: Secondary | ICD-10-CM

## 2014-11-27 LAB — POCT INR: INR: 3.3

## 2014-12-16 ENCOUNTER — Ambulatory Visit (INDEPENDENT_AMBULATORY_CARE_PROVIDER_SITE_OTHER): Payer: Medicare PPO | Admitting: *Deleted

## 2014-12-16 DIAGNOSIS — I4891 Unspecified atrial fibrillation: Secondary | ICD-10-CM

## 2014-12-16 DIAGNOSIS — Z7901 Long term (current) use of anticoagulants: Secondary | ICD-10-CM

## 2014-12-16 LAB — POCT INR: INR: 2.2

## 2014-12-22 ENCOUNTER — Ambulatory Visit (INDEPENDENT_AMBULATORY_CARE_PROVIDER_SITE_OTHER): Payer: Medicare PPO | Admitting: *Deleted

## 2014-12-22 DIAGNOSIS — I495 Sick sinus syndrome: Secondary | ICD-10-CM

## 2014-12-22 NOTE — Progress Notes (Signed)
Remote pacemaker transmission.   

## 2014-12-23 LAB — MDC_IDC_ENUM_SESS_TYPE_REMOTE
Battery Voltage: 2.79 V
Brady Statistic AS VS Percent: 2 %
Lead Channel Impedance Value: 441 Ohm
Lead Channel Impedance Value: 756 Ohm
Lead Channel Pacing Threshold Amplitude: 0.625 V
Lead Channel Pacing Threshold Pulse Width: 0.4 ms
Lead Channel Sensing Intrinsic Amplitude: 4 mV
Lead Channel Setting Pacing Pulse Width: 0.4 ms
MDC IDC MSMT BATTERY IMPEDANCE: 346 Ohm
MDC IDC MSMT BATTERY REMAINING LONGEVITY: 87 mo
MDC IDC MSMT LEADCHNL RA SENSING INTR AMPL: 0.7 mV
MDC IDC SESS DTM: 20160313151620
MDC IDC SET LEADCHNL RA PACING AMPLITUDE: 3.5 V
MDC IDC SET LEADCHNL RV PACING AMPLITUDE: 2.5 V
MDC IDC SET LEADCHNL RV SENSING SENSITIVITY: 4 mV
MDC IDC STAT BRADY AP VP PERCENT: 67 %
MDC IDC STAT BRADY AP VS PERCENT: 0 %
MDC IDC STAT BRADY AS VP PERCENT: 31 %

## 2014-12-30 ENCOUNTER — Ambulatory Visit (INDEPENDENT_AMBULATORY_CARE_PROVIDER_SITE_OTHER): Payer: Medicare PPO

## 2014-12-30 DIAGNOSIS — Z7901 Long term (current) use of anticoagulants: Secondary | ICD-10-CM

## 2014-12-30 DIAGNOSIS — I4891 Unspecified atrial fibrillation: Secondary | ICD-10-CM | POA: Diagnosis not present

## 2014-12-30 LAB — POCT INR: INR: 1.3

## 2015-01-01 ENCOUNTER — Encounter: Payer: Self-pay | Admitting: Cardiology

## 2015-01-09 ENCOUNTER — Encounter: Payer: Self-pay | Admitting: Internal Medicine

## 2015-01-12 ENCOUNTER — Ambulatory Visit (INDEPENDENT_AMBULATORY_CARE_PROVIDER_SITE_OTHER): Payer: Medicare PPO

## 2015-01-12 DIAGNOSIS — I4891 Unspecified atrial fibrillation: Secondary | ICD-10-CM | POA: Diagnosis not present

## 2015-01-12 DIAGNOSIS — Z7901 Long term (current) use of anticoagulants: Secondary | ICD-10-CM

## 2015-01-12 LAB — POCT INR: INR: 2

## 2015-01-26 ENCOUNTER — Ambulatory Visit (INDEPENDENT_AMBULATORY_CARE_PROVIDER_SITE_OTHER): Payer: Medicare PPO | Admitting: *Deleted

## 2015-01-26 DIAGNOSIS — Z7901 Long term (current) use of anticoagulants: Secondary | ICD-10-CM

## 2015-01-26 DIAGNOSIS — I4891 Unspecified atrial fibrillation: Secondary | ICD-10-CM | POA: Diagnosis not present

## 2015-01-26 LAB — POCT INR: INR: 2.6

## 2015-02-23 ENCOUNTER — Other Ambulatory Visit: Payer: Self-pay | Admitting: Cardiovascular Disease

## 2015-03-02 ENCOUNTER — Ambulatory Visit (INDEPENDENT_AMBULATORY_CARE_PROVIDER_SITE_OTHER): Payer: Medicare PPO | Admitting: *Deleted

## 2015-03-02 DIAGNOSIS — Z7901 Long term (current) use of anticoagulants: Secondary | ICD-10-CM | POA: Diagnosis not present

## 2015-03-02 DIAGNOSIS — I4891 Unspecified atrial fibrillation: Secondary | ICD-10-CM

## 2015-03-02 LAB — POCT INR: INR: 1.9

## 2015-03-23 DIAGNOSIS — I495 Sick sinus syndrome: Secondary | ICD-10-CM

## 2015-03-24 ENCOUNTER — Ambulatory Visit (INDEPENDENT_AMBULATORY_CARE_PROVIDER_SITE_OTHER): Payer: Medicare PPO | Admitting: *Deleted

## 2015-03-24 DIAGNOSIS — I495 Sick sinus syndrome: Secondary | ICD-10-CM

## 2015-03-24 NOTE — Progress Notes (Signed)
Remote pacemaker transmission.   

## 2015-03-29 ENCOUNTER — Emergency Department (HOSPITAL_COMMUNITY): Payer: Medicare PPO

## 2015-03-29 ENCOUNTER — Emergency Department (HOSPITAL_COMMUNITY)
Admission: EM | Admit: 2015-03-29 | Discharge: 2015-03-29 | Disposition: A | Discharge status: Home / Self Care | Payer: Medicare PPO | Attending: Emergency Medicine | Admitting: Emergency Medicine

## 2015-03-29 ENCOUNTER — Encounter (HOSPITAL_COMMUNITY): Payer: Self-pay | Admitting: Emergency Medicine

## 2015-03-29 DIAGNOSIS — F329 Major depressive disorder, single episode, unspecified: Secondary | ICD-10-CM | POA: Insufficient documentation

## 2015-03-29 DIAGNOSIS — I509 Heart failure, unspecified: Secondary | ICD-10-CM | POA: Insufficient documentation

## 2015-03-29 DIAGNOSIS — F419 Anxiety disorder, unspecified: Secondary | ICD-10-CM | POA: Insufficient documentation

## 2015-03-29 DIAGNOSIS — Z79899 Other long term (current) drug therapy: Secondary | ICD-10-CM | POA: Insufficient documentation

## 2015-03-29 DIAGNOSIS — R079 Chest pain, unspecified: Secondary | ICD-10-CM | POA: Insufficient documentation

## 2015-03-29 DIAGNOSIS — Z7901 Long term (current) use of anticoagulants: Secondary | ICD-10-CM | POA: Diagnosis not present

## 2015-03-29 DIAGNOSIS — M549 Dorsalgia, unspecified: Secondary | ICD-10-CM | POA: Insufficient documentation

## 2015-03-29 DIAGNOSIS — I252 Old myocardial infarction: Secondary | ICD-10-CM | POA: Insufficient documentation

## 2015-03-29 DIAGNOSIS — F0390 Unspecified dementia without behavioral disturbance: Secondary | ICD-10-CM | POA: Insufficient documentation

## 2015-03-29 DIAGNOSIS — I2510 Atherosclerotic heart disease of native coronary artery without angina pectoris: Secondary | ICD-10-CM | POA: Insufficient documentation

## 2015-03-29 DIAGNOSIS — I4891 Unspecified atrial fibrillation: Secondary | ICD-10-CM | POA: Insufficient documentation

## 2015-03-29 DIAGNOSIS — Z8719 Personal history of other diseases of the digestive system: Secondary | ICD-10-CM | POA: Diagnosis not present

## 2015-03-29 DIAGNOSIS — Z950 Presence of cardiac pacemaker: Secondary | ICD-10-CM | POA: Insufficient documentation

## 2015-03-29 DIAGNOSIS — I10 Essential (primary) hypertension: Secondary | ICD-10-CM | POA: Diagnosis not present

## 2015-03-29 DIAGNOSIS — E119 Type 2 diabetes mellitus without complications: Secondary | ICD-10-CM | POA: Insufficient documentation

## 2015-03-29 DIAGNOSIS — Z880 Allergy status to penicillin: Secondary | ICD-10-CM | POA: Insufficient documentation

## 2015-03-29 LAB — BASIC METABOLIC PANEL
Anion gap: 12 (ref 5–15)
BUN: 19 mg/dL (ref 6–20)
CO2: 21 mmol/L — ABNORMAL LOW (ref 22–32)
Calcium: 9.2 mg/dL (ref 8.9–10.3)
Chloride: 104 mmol/L (ref 101–111)
Creatinine, Ser: 0.97 mg/dL (ref 0.44–1.00)
GFR calc Af Amer: >60 mL/min (ref >60)
GFR, EST NON AFRICAN AMERICAN: 55 mL/min — AB (ref >60)
GLUCOSE: 212 mg/dL — AB (ref 65–99)
POTASSIUM: 4.3 mmol/L (ref 3.5–5.1)
Sodium: 137 mmol/L (ref 135–145)

## 2015-03-29 LAB — CUP PACEART REMOTE DEVICE CHECK
Battery Impedance: 396 Ohm
Battery Remaining Longevity: 83 mo
Battery Voltage: 2.78 V
Brady Statistic AP VS Percent: 0 %
Brady Statistic AS VS Percent: 2 %
Date Time Interrogation Session: 20160613132730
Lead Channel Impedance Value: 442 Ohm
Lead Channel Impedance Value: 699 Ohm
Lead Channel Setting Pacing Amplitude: 2.5 V
Lead Channel Setting Sensing Sensitivity: 4 mV
MDC IDC MSMT LEADCHNL RA SENSING INTR AMPL: 0.7 mV
MDC IDC MSMT LEADCHNL RV PACING THRESHOLD AMPLITUDE: 0.625 V
MDC IDC MSMT LEADCHNL RV PACING THRESHOLD PULSEWIDTH: 0.4 ms
MDC IDC SET LEADCHNL RA PACING AMPLITUDE: 3.5 V
MDC IDC SET LEADCHNL RV PACING PULSEWIDTH: 0.4 ms
MDC IDC STAT BRADY AP VP PERCENT: 68 %
MDC IDC STAT BRADY AS VP PERCENT: 30 %

## 2015-03-29 LAB — I-STAT TROPONIN, ED: Troponin i, poc: 0.02 ng/mL (ref 0.00–0.08)

## 2015-03-29 LAB — CBC
HEMATOCRIT: 35.4 % — AB (ref 36.0–46.0)
Hemoglobin: 11.9 g/dL — ABNORMAL LOW (ref 12.0–15.0)
MCH: 31.2 pg (ref 26.0–34.0)
MCHC: 33.6 g/dL (ref 30.0–36.0)
MCV: 92.7 fL (ref 78.0–100.0)
Platelets: 243 10*3/uL (ref 150–400)
RBC: 3.82 MIL/uL — AB (ref 3.87–5.11)
RDW: 14 % (ref 11.5–15.5)
WBC: 13.8 10*3/uL — AB (ref 4.0–10.5)

## 2015-03-29 LAB — PROTIME-INR
INR: 1.28 (ref 0.00–1.49)
PROTHROMBIN TIME: 16.1 s — AB (ref 11.6–15.2)

## 2015-03-29 NOTE — Discharge Instructions (Signed)

## 2015-03-29 NOTE — ED Notes (Signed)
C/o midsternal chest pain that comes and goes.  Hx of panick attacks and is hyper aware of noise and pain and some times will lash out and hit without meaning to.  Denies having any pain at this time.

## 2015-03-29 NOTE — ED Provider Notes (Signed)
CSN: [id number]     Arrival date & time 03/29/15  2102 History   First MD Initiated Contact with Patient 03/29/15 2127     Chief Complaint  Patient presents with  . Chest Pain     (Consider location/radiation/quality/duration/timing/severity/associated sxs/prior Treatment) Patient is a 80 y.o. female presenting with chest pain. The history is provided by the patient.  Chest Pain Associated symptoms: back pain   Associated symptoms: no abdominal pain, no headache, no nausea, no numbness, no shortness of breath, not vomiting and no weakness    patient presents with chest pain and right upper chest. States that she was made to come in. She did not want to. States it was sharp. Worse with palpation. No shortness of breath. No cough. Slight dementia.  Past Medical History  Diagnosis Date  . HYPERCHOLESTEROLEMIA   . ANXIETY   . Takotsubo cardiomyopathy     2006  . CARDIOMYOPATHY     Nonischemic; nonobstructive CAD, EF 35-40% by cath 07/2010, EF 25% by echo 07/2010  . Atrial fibrillation     on coumadin  . SICK SINUS SYNDROME     s/p Medtronic PPM '04, generator change '12  . CHF   . GERD   . Diabetes mellitus   . Coronary artery disease     non obstructive  . Myocardial infarction   . Hypertension   . Pacemaker     Medtronic  . Shortness of breath   . Arthritis   . Depression   . DEMENTIA    Past Surgical History  Procedure Laterality Date  . Coronary angioplasty    . Pacemaker insertion  2004, 2012    Medtronic   . Cervical spine surgery    . Back surgery    . Radical hysterectomy     Family History  Problem Relation Age of Onset  . Other      no known CAD   History  Substance Use Topics  . Smoking status: Never Smoker   . Smokeless tobacco: Never Used  . Alcohol Use: No   OB History    No data available     Review of Systems  Constitutional: Negative for activity change and appetite change.  Eyes: Negative for pain.  Respiratory: Negative for chest  tightness and shortness of breath.   Cardiovascular: Positive for chest pain. Negative for leg swelling.  Gastrointestinal: Negative for nausea, vomiting, abdominal pain and diarrhea.  Genitourinary: Negative for flank pain.  Musculoskeletal: Positive for back pain. Negative for neck stiffness.  Skin: Negative for rash.  Neurological: Negative for weakness, numbness and headaches.  Psychiatric/Behavioral: Negative for behavioral problems.      Allergies  Penicillins  Home Medications   Prior to Admission medications   Medication Sig Start Date End Date Taking? Authorizing Provider  benazepril (LOTENSIN) 20 MG tablet Take 0.5 tablets (10 mg total) by mouth daily. 03/05/14   Wendall Stade, MD  escitalopram (LEXAPRO) 10 MG tablet Take 10 mg by mouth daily.    Historical Provider, MD  furosemide (LASIX) 40 MG tablet Take 1/2 tablet in the morning and 1 tablet in the evening 09/12/14   Wendall Stade, MD  glipiZIDE (GLUCOTROL) 5 MG tablet Take 5 mg by mouth 2 (two) times daily before a meal.  12/19/13   Historical Provider, MD  memantine (NAMENDA) 10 MG tablet Take 10 mg by mouth 2 (two) times daily.     Historical Provider, MD  metFORMIN (GLUCOPHAGE) 500 MG tablet Take 500 mg  by mouth 2 (two) times daily with a meal.  12/19/13   Historical Provider, MD  metoprolol succinate (TOPROL-XL) 50 MG 24 hr tablet TAKE 1 TABLET BY MOUTH ONCE DAILY 07/17/14   Wendall Stade, MD  nitroGLYCERIN (NITROSTAT) 0.4 MG SL tablet Place 0.4 mg under the tongue every 5 (five) minutes as needed. For chest pain    Historical Provider, MD  spironolactone (ALDACTONE) 25 MG tablet TAKE 1/2 TABLET BY MOUTH DAILY 02/23/15   Wendall Stade, MD  warfarin (COUMADIN) 4 MG tablet Take as directed by Coumadin clinic 08/18/14   Wendall Stade, MD   BP 140/58 mmHg  Pulse 60  Temp(Src) 98.3 F (36.8 C) (Oral)  Resp 19  Ht 5\' 8"  (1.727 m)  Wt 188 lb (85.276 kg)  BMI 28.59 kg/m2  SpO2 95% Physical Exam  Constitutional: She  appears well-developed.  HENT:  Head: Normocephalic.  Eyes: Pupils are equal, round, and reactive to light.  Cardiovascular: Normal rate.   Pulmonary/Chest: Effort normal. She exhibits tenderness.  Moderate tenderness to right anterior and somewhat superior chest wall. No crepitance or deformity. No rash.  Abdominal: Soft.  Musculoskeletal: She exhibits no edema.  Neurological: She is alert.  Skin: Skin is warm.    ED Course  Procedures (including critical care time) Labs Review Labs Reviewed  CBC - Abnormal; Notable for the following:    WBC 13.8 (*)    RBC 3.82 (*)    Hemoglobin 11.9 (*)    HCT 35.4 (*)    All other components within normal limits  BASIC METABOLIC PANEL - Abnormal; Notable for the following:    CO2 21 (*)    Glucose, Bld 212 (*)    GFR calc non Af Amer 55 (*)    All other components within normal limits  PROTIME-INR - Abnormal; Notable for the following:    Prothrombin Time 16.1 (*)    All other components within normal limits  Rosezena Sensor, ED    Imaging Review Dg Chest Port 1 View  03/29/2015   CLINICAL DATA:  Patient with mid to right chest pain.  EXAM: PORTABLE CHEST - 1 VIEW  COMPARISON:  Chest radiograph 11/09/2013  FINDINGS: Anterior cervical spinal fusion hardware. Monitoring leads overlie the patient. Pacer apparatus, stable in position. Stable enlarged cardiac and mediastinal contours. Slight interval increase in retrocardiac opacification. Unchanged right mid and lower lung opacities. No definite pleural effusion or pneumothorax.  IMPRESSION: Slight interval increase in retrocardiac opacity. Findings may be secondary to atelectasis however infection not excluded.  Cardiomegaly.   Electronically Signed   By: Annia Belt M.D.   On: 03/29/2015 21:47     EKG Interpretation   Date/Time:  Sunday March 29 2015 21:09:55 EDT Ventricular Rate:  72 PR Interval:  238 QRS Duration: 147 QT Interval:  427 QTC Calculation: 467 R Axis:   -88 Text  Interpretation:  electronically paced. Confirmed by Rubin Payor  MD,  Harrold Donath [phone]) on 03/29/2015 11:41:05 PM      MDM   Final diagnoses:  Chest pain, unspecified chest pain type    Patient presents with chest pain. Right upper chest and reproducible. EKG reassuring and enzymes negative. Somewhat poor historian. Doubt cardiac cause. X-ray shows no clear pneumonia. Will discharge home.    Benjiman Core, MD 03/29/15 [phone]

## 2015-03-30 ENCOUNTER — Other Ambulatory Visit: Payer: Self-pay | Admitting: Cardiovascular Disease

## 2015-03-31 ENCOUNTER — Ambulatory Visit (INDEPENDENT_AMBULATORY_CARE_PROVIDER_SITE_OTHER): Payer: Medicare PPO | Admitting: *Deleted

## 2015-03-31 DIAGNOSIS — I4891 Unspecified atrial fibrillation: Secondary | ICD-10-CM | POA: Diagnosis not present

## 2015-03-31 DIAGNOSIS — Z7901 Long term (current) use of anticoagulants: Secondary | ICD-10-CM

## 2015-03-31 LAB — POCT INR: INR: 1.2

## 2015-04-01 ENCOUNTER — Encounter: Payer: Self-pay | Admitting: Cardiology

## 2015-04-03 ENCOUNTER — Encounter: Payer: Self-pay | Admitting: Internal Medicine

## 2015-04-07 ENCOUNTER — Ambulatory Visit (INDEPENDENT_AMBULATORY_CARE_PROVIDER_SITE_OTHER): Payer: Medicare PPO | Admitting: *Deleted

## 2015-04-07 DIAGNOSIS — I4891 Unspecified atrial fibrillation: Secondary | ICD-10-CM

## 2015-04-07 DIAGNOSIS — Z7901 Long term (current) use of anticoagulants: Secondary | ICD-10-CM | POA: Diagnosis not present

## 2015-04-07 LAB — POCT INR: INR: 3.1

## 2015-04-10 ENCOUNTER — Other Ambulatory Visit: Payer: Self-pay | Admitting: Cardiovascular Disease

## 2015-04-20 ENCOUNTER — Ambulatory Visit (INDEPENDENT_AMBULATORY_CARE_PROVIDER_SITE_OTHER): Payer: Medicare PPO | Admitting: *Deleted

## 2015-04-20 DIAGNOSIS — Z7901 Long term (current) use of anticoagulants: Secondary | ICD-10-CM | POA: Diagnosis not present

## 2015-04-20 DIAGNOSIS — I4891 Unspecified atrial fibrillation: Secondary | ICD-10-CM | POA: Diagnosis not present

## 2015-04-20 LAB — POCT INR: INR: 2.4

## 2015-05-04 ENCOUNTER — Other Ambulatory Visit: Payer: Self-pay | Admitting: Cardiovascular Disease

## 2015-05-05 ENCOUNTER — Other Ambulatory Visit: Payer: Self-pay

## 2015-05-05 ENCOUNTER — Ambulatory Visit (INDEPENDENT_AMBULATORY_CARE_PROVIDER_SITE_OTHER): Payer: Medicare PPO | Admitting: *Deleted

## 2015-05-05 DIAGNOSIS — Z7901 Long term (current) use of anticoagulants: Secondary | ICD-10-CM | POA: Diagnosis not present

## 2015-05-05 DIAGNOSIS — I4891 Unspecified atrial fibrillation: Secondary | ICD-10-CM | POA: Diagnosis not present

## 2015-05-05 LAB — POCT INR: INR: 2.2

## 2015-05-05 MED ORDER — BENAZEPRIL HCL 20 MG PO TABS: 10.0000 mg | ORAL_TABLET | Freq: Every day | ORAL | Status: DC

## 2015-05-26 ENCOUNTER — Ambulatory Visit (INDEPENDENT_AMBULATORY_CARE_PROVIDER_SITE_OTHER): Payer: Medicare PPO | Admitting: *Deleted

## 2015-05-26 DIAGNOSIS — I4891 Unspecified atrial fibrillation: Secondary | ICD-10-CM | POA: Diagnosis not present

## 2015-05-26 DIAGNOSIS — Z7901 Long term (current) use of anticoagulants: Secondary | ICD-10-CM | POA: Diagnosis not present

## 2015-05-26 LAB — POCT INR: INR: 3.3

## 2015-06-04 ENCOUNTER — Other Ambulatory Visit: Payer: Self-pay | Admitting: Cardiovascular Disease

## 2015-06-10 ENCOUNTER — Ambulatory Visit (INDEPENDENT_AMBULATORY_CARE_PROVIDER_SITE_OTHER): Payer: Medicare PPO | Admitting: *Deleted

## 2015-06-10 DIAGNOSIS — Z7901 Long term (current) use of anticoagulants: Secondary | ICD-10-CM

## 2015-06-10 DIAGNOSIS — I4891 Unspecified atrial fibrillation: Secondary | ICD-10-CM

## 2015-06-10 LAB — POCT INR: INR: 1.7

## 2015-06-23 ENCOUNTER — Telehealth: Payer: Self-pay | Admitting: Cardiology

## 2015-06-23 ENCOUNTER — Ambulatory Visit (INDEPENDENT_AMBULATORY_CARE_PROVIDER_SITE_OTHER): Payer: Medicare PPO | Admitting: *Deleted

## 2015-06-23 DIAGNOSIS — I495 Sick sinus syndrome: Secondary | ICD-10-CM

## 2015-06-23 NOTE — Telephone Encounter (Signed)
Confirmed remote transmission w/ pt daguhter.   

## 2015-06-24 ENCOUNTER — Ambulatory Visit (INDEPENDENT_AMBULATORY_CARE_PROVIDER_SITE_OTHER): Payer: Medicare PPO | Admitting: *Deleted

## 2015-06-24 ENCOUNTER — Encounter: Payer: Self-pay | Admitting: Cardiology

## 2015-06-24 ENCOUNTER — Encounter: Payer: Self-pay | Admitting: Internal Medicine

## 2015-06-24 DIAGNOSIS — I495 Sick sinus syndrome: Secondary | ICD-10-CM | POA: Diagnosis not present

## 2015-06-24 DIAGNOSIS — Z7901 Long term (current) use of anticoagulants: Secondary | ICD-10-CM | POA: Diagnosis not present

## 2015-06-24 DIAGNOSIS — I4891 Unspecified atrial fibrillation: Secondary | ICD-10-CM | POA: Diagnosis not present

## 2015-06-24 LAB — POCT INR: INR: 1.7

## 2015-06-24 NOTE — Progress Notes (Signed)
Remote pacemaker transmission.   

## 2015-07-02 ENCOUNTER — Emergency Department (HOSPITAL_COMMUNITY): Payer: Medicare PPO

## 2015-07-02 ENCOUNTER — Encounter (HOSPITAL_COMMUNITY): Payer: Self-pay | Admitting: *Deleted

## 2015-07-02 ENCOUNTER — Emergency Department (HOSPITAL_COMMUNITY)
Admission: EM | Admit: 2015-07-02 | Discharge: 2015-07-02 | Disposition: A | Discharge status: Home / Self Care | Payer: Medicare PPO | Attending: Emergency Medicine | Admitting: Emergency Medicine

## 2015-07-02 DIAGNOSIS — W19XXXA Unspecified fall, initial encounter: Secondary | ICD-10-CM

## 2015-07-02 DIAGNOSIS — F419 Anxiety disorder, unspecified: Secondary | ICD-10-CM | POA: Insufficient documentation

## 2015-07-02 DIAGNOSIS — F329 Major depressive disorder, single episode, unspecified: Secondary | ICD-10-CM | POA: Insufficient documentation

## 2015-07-02 DIAGNOSIS — Z8739 Personal history of other diseases of the musculoskeletal system and connective tissue: Secondary | ICD-10-CM | POA: Insufficient documentation

## 2015-07-02 DIAGNOSIS — Z880 Allergy status to penicillin: Secondary | ICD-10-CM | POA: Diagnosis not present

## 2015-07-02 DIAGNOSIS — I509 Heart failure, unspecified: Secondary | ICD-10-CM | POA: Diagnosis not present

## 2015-07-02 DIAGNOSIS — Y999 Unspecified external cause status: Secondary | ICD-10-CM | POA: Insufficient documentation

## 2015-07-02 DIAGNOSIS — F0390 Unspecified dementia without behavioral disturbance: Secondary | ICD-10-CM | POA: Diagnosis not present

## 2015-07-02 DIAGNOSIS — I10 Essential (primary) hypertension: Secondary | ICD-10-CM | POA: Insufficient documentation

## 2015-07-02 DIAGNOSIS — S8991XA Unspecified injury of right lower leg, initial encounter: Secondary | ICD-10-CM | POA: Insufficient documentation

## 2015-07-02 DIAGNOSIS — Z7901 Long term (current) use of anticoagulants: Secondary | ICD-10-CM | POA: Diagnosis not present

## 2015-07-02 DIAGNOSIS — Z79899 Other long term (current) drug therapy: Secondary | ICD-10-CM | POA: Diagnosis not present

## 2015-07-02 DIAGNOSIS — I2510 Atherosclerotic heart disease of native coronary artery without angina pectoris: Secondary | ICD-10-CM | POA: Diagnosis not present

## 2015-07-02 DIAGNOSIS — S299XXA Unspecified injury of thorax, initial encounter: Secondary | ICD-10-CM | POA: Insufficient documentation

## 2015-07-02 DIAGNOSIS — Z950 Presence of cardiac pacemaker: Secondary | ICD-10-CM | POA: Insufficient documentation

## 2015-07-02 DIAGNOSIS — Y92129 Unspecified place in nursing home as the place of occurrence of the external cause: Secondary | ICD-10-CM | POA: Insufficient documentation

## 2015-07-02 DIAGNOSIS — W06XXXA Fall from bed, initial encounter: Secondary | ICD-10-CM | POA: Insufficient documentation

## 2015-07-02 DIAGNOSIS — Y9389 Activity, other specified: Secondary | ICD-10-CM | POA: Insufficient documentation

## 2015-07-02 DIAGNOSIS — E119 Type 2 diabetes mellitus without complications: Secondary | ICD-10-CM | POA: Insufficient documentation

## 2015-07-02 DIAGNOSIS — Z8719 Personal history of other diseases of the digestive system: Secondary | ICD-10-CM | POA: Diagnosis not present

## 2015-07-02 DIAGNOSIS — I252 Old myocardial infarction: Secondary | ICD-10-CM | POA: Diagnosis not present

## 2015-07-02 DIAGNOSIS — Z9861 Coronary angioplasty status: Secondary | ICD-10-CM | POA: Insufficient documentation

## 2015-07-02 DIAGNOSIS — I4891 Unspecified atrial fibrillation: Secondary | ICD-10-CM | POA: Insufficient documentation

## 2015-07-02 LAB — CUP PACEART REMOTE DEVICE CHECK
Brady Statistic AP VP Percent: 69 %
Brady Statistic AP VS Percent: 0 %
Brady Statistic AS VS Percent: 2 %
Date Time Interrogation Session: 20160914144857
Lead Channel Impedance Value: 697 Ohm
Lead Channel Pacing Threshold Amplitude: 0.625 V
Lead Channel Sensing Intrinsic Amplitude: 0.7 mV
Lead Channel Setting Pacing Amplitude: 2.5 V
Lead Channel Setting Pacing Amplitude: 3.5 V
Lead Channel Setting Pacing Pulse Width: 0.4 ms
Lead Channel Setting Sensing Sensitivity: 4 mV
MDC IDC MSMT BATTERY IMPEDANCE: 446 Ohm
MDC IDC MSMT BATTERY REMAINING LONGEVITY: 79 mo
MDC IDC MSMT BATTERY VOLTAGE: 2.78 V
MDC IDC MSMT LEADCHNL RA IMPEDANCE VALUE: 436 Ohm
MDC IDC MSMT LEADCHNL RV PACING THRESHOLD PULSEWIDTH: 0.4 ms
MDC IDC STAT BRADY AS VP PERCENT: 30 %

## 2015-07-02 NOTE — Discharge Instructions (Signed)

## 2015-07-02 NOTE — ED Notes (Signed)
Family at bedside. Updated that pt is in CT

## 2015-07-02 NOTE — ED Notes (Addendum)
EMS-patient is from [facility]. Patient was getting out of bed when her right leg "gave out" and patient fell. Patient is not a good historian and am unsure how patient fell. Unable to tell me if she hit her head. Patient complains of chronic right leg pain, right chest pain, and bilateral hand pain all chronic in nature.
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
## 2015-07-08 ENCOUNTER — Ambulatory Visit (INDEPENDENT_AMBULATORY_CARE_PROVIDER_SITE_OTHER): Payer: Medicare PPO | Admitting: *Deleted

## 2015-07-08 DIAGNOSIS — Z7901 Long term (current) use of anticoagulants: Secondary | ICD-10-CM

## 2015-07-08 DIAGNOSIS — I4891 Unspecified atrial fibrillation: Secondary | ICD-10-CM

## 2015-07-08 LAB — POCT INR: INR: 2.1

## 2015-07-17 ENCOUNTER — Encounter: Payer: Self-pay | Admitting: Cardiology

## 2015-07-20 ENCOUNTER — Ambulatory Visit (INDEPENDENT_AMBULATORY_CARE_PROVIDER_SITE_OTHER): Payer: Medicare PPO | Admitting: *Deleted

## 2015-07-20 DIAGNOSIS — Z7901 Long term (current) use of anticoagulants: Secondary | ICD-10-CM

## 2015-07-20 DIAGNOSIS — I481 Persistent atrial fibrillation: Secondary | ICD-10-CM | POA: Diagnosis not present

## 2015-07-20 DIAGNOSIS — I4891 Unspecified atrial fibrillation: Secondary | ICD-10-CM | POA: Diagnosis not present

## 2015-07-20 DIAGNOSIS — I4819 Other persistent atrial fibrillation: Secondary | ICD-10-CM

## 2015-07-20 LAB — POCT INR: INR: 1.6

## 2015-08-03 ENCOUNTER — Ambulatory Visit (INDEPENDENT_AMBULATORY_CARE_PROVIDER_SITE_OTHER): Payer: Medicare PPO | Admitting: *Deleted

## 2015-08-03 DIAGNOSIS — I4891 Unspecified atrial fibrillation: Secondary | ICD-10-CM | POA: Diagnosis not present

## 2015-08-03 DIAGNOSIS — I481 Persistent atrial fibrillation: Secondary | ICD-10-CM

## 2015-08-03 DIAGNOSIS — I4819 Other persistent atrial fibrillation: Secondary | ICD-10-CM

## 2015-08-03 DIAGNOSIS — Z7901 Long term (current) use of anticoagulants: Secondary | ICD-10-CM

## 2015-08-03 LAB — POCT INR: INR: 1.2

## 2015-08-17 ENCOUNTER — Ambulatory Visit (INDEPENDENT_AMBULATORY_CARE_PROVIDER_SITE_OTHER): Payer: Medicare PPO | Admitting: *Deleted

## 2015-08-17 DIAGNOSIS — I4891 Unspecified atrial fibrillation: Secondary | ICD-10-CM | POA: Diagnosis not present

## 2015-08-17 DIAGNOSIS — I481 Persistent atrial fibrillation: Secondary | ICD-10-CM

## 2015-08-17 DIAGNOSIS — Z7901 Long term (current) use of anticoagulants: Secondary | ICD-10-CM | POA: Diagnosis not present

## 2015-08-17 DIAGNOSIS — I4819 Other persistent atrial fibrillation: Secondary | ICD-10-CM

## 2015-08-17 LAB — POCT INR: INR: 2.3

## 2015-08-31 ENCOUNTER — Ambulatory Visit (INDEPENDENT_AMBULATORY_CARE_PROVIDER_SITE_OTHER): Payer: Medicare PPO | Admitting: *Deleted

## 2015-08-31 DIAGNOSIS — I4819 Other persistent atrial fibrillation: Secondary | ICD-10-CM

## 2015-08-31 DIAGNOSIS — I481 Persistent atrial fibrillation: Secondary | ICD-10-CM | POA: Diagnosis not present

## 2015-08-31 DIAGNOSIS — Z23 Encounter for immunization: Secondary | ICD-10-CM

## 2015-08-31 DIAGNOSIS — Z7901 Long term (current) use of anticoagulants: Secondary | ICD-10-CM | POA: Diagnosis not present

## 2015-08-31 DIAGNOSIS — I4891 Unspecified atrial fibrillation: Secondary | ICD-10-CM

## 2015-08-31 LAB — POCT INR: INR: 1.6

## 2015-09-02 ENCOUNTER — Other Ambulatory Visit: Payer: Self-pay | Admitting: Cardiovascular Disease

## 2015-09-14 ENCOUNTER — Ambulatory Visit (INDEPENDENT_AMBULATORY_CARE_PROVIDER_SITE_OTHER): Payer: Medicare PPO | Admitting: *Deleted

## 2015-09-14 DIAGNOSIS — I4891 Unspecified atrial fibrillation: Secondary | ICD-10-CM | POA: Diagnosis not present

## 2015-09-14 DIAGNOSIS — Z7901 Long term (current) use of anticoagulants: Secondary | ICD-10-CM

## 2015-09-14 DIAGNOSIS — I481 Persistent atrial fibrillation: Secondary | ICD-10-CM

## 2015-09-14 DIAGNOSIS — I4819 Other persistent atrial fibrillation: Secondary | ICD-10-CM

## 2015-09-14 LAB — POCT INR: INR: 1.6

## 2015-09-18 ENCOUNTER — Other Ambulatory Visit: Payer: Self-pay | Admitting: Cardiovascular Disease

## 2015-09-23 ENCOUNTER — Other Ambulatory Visit: Payer: Self-pay | Admitting: *Deleted

## 2015-09-23 MED ORDER — SPIRONOLACTONE 25 MG PO TABS: ORAL_TABLET | ORAL | Status: DC

## 2015-09-28 ENCOUNTER — Other Ambulatory Visit: Payer: Self-pay

## 2015-09-28 ENCOUNTER — Ambulatory Visit (INDEPENDENT_AMBULATORY_CARE_PROVIDER_SITE_OTHER): Payer: Medicare PPO | Admitting: Pharmacist

## 2015-09-28 DIAGNOSIS — I481 Persistent atrial fibrillation: Secondary | ICD-10-CM | POA: Diagnosis not present

## 2015-09-28 DIAGNOSIS — Z7901 Long term (current) use of anticoagulants: Secondary | ICD-10-CM

## 2015-09-28 DIAGNOSIS — I4891 Unspecified atrial fibrillation: Secondary | ICD-10-CM | POA: Diagnosis not present

## 2015-09-28 DIAGNOSIS — I4819 Other persistent atrial fibrillation: Secondary | ICD-10-CM

## 2015-09-28 LAB — POCT INR: INR: 2

## 2015-09-28 MED ORDER — SPIRONOLACTONE 25 MG PO TABS: ORAL_TABLET | ORAL | Status: DC

## 2015-10-06 ENCOUNTER — Encounter: Payer: Self-pay | Admitting: *Deleted

## 2015-10-14 ENCOUNTER — Other Ambulatory Visit: Payer: Self-pay | Admitting: Cardiovascular Disease

## 2015-10-26 ENCOUNTER — Ambulatory Visit (INDEPENDENT_AMBULATORY_CARE_PROVIDER_SITE_OTHER): Payer: Medicare Other | Admitting: *Deleted

## 2015-10-26 DIAGNOSIS — I4891 Unspecified atrial fibrillation: Secondary | ICD-10-CM

## 2015-10-26 DIAGNOSIS — Z7901 Long term (current) use of anticoagulants: Secondary | ICD-10-CM

## 2015-10-26 DIAGNOSIS — I481 Persistent atrial fibrillation: Secondary | ICD-10-CM

## 2015-10-26 DIAGNOSIS — I4819 Other persistent atrial fibrillation: Secondary | ICD-10-CM

## 2015-10-26 LAB — POCT INR: INR: 2.1

## 2015-11-16 NOTE — Progress Notes (Signed)
Patient ID: [name], female   DOB: August 10, 1935, 81 y.o.   MRN: [id number] [name] is a  81 y.o.  Caucasian female with PMHx significant for CAD (nonobsructive CAD by cath 2011; EF 35-40%), Takatsubo CM (2006), NICM (EF 25% per 10/11 echo), atrial fibrillation (on Coumadin), SSS (s/p PPM 2004), HTN, HL, type 2 DM and anxiety Hospitalization 01/31/12 for atypical chest pain R/O. Resides at assisted living now. Dementia and psychiatric illness makes her hard to care for per daughter. LE edema from varicose veings. Long discussion with her about not dying from her heart. Also discussed the fact that her pacer cannot come out She has fallen a few times Discussed possibly needing to stop coumadin in future On klonipan instead xanax and this may be contributing   Not exercising as much Family having a difficult time with her Not falling that much  Remote pacer check 12/15 reviewed and normal   Only one bad fall since last visit    ROS: Denies fever, malais, weight loss, blurry vision, decreased visual acuity, cough, sputum, SOB, hemoptysis, pleuritic pain, palpitaitons, heartburn, abdominal pain, melena, lower extremity edema, claudication, or rash.  All other systems reviewed and negative  General: Excitable  Healthy:  appears stated age HEENT: normal Neck supple with no adenopathy JVP normal no bruits no thyromegaly Lungs clear with no wheezing and good diaphragmatic motion Heart:  S1/S2 no murmur, no rub, gallop or click PMI normal Abdomen: benighn, BS positve, no tenderness, no AAA no bruit.  No HSM or HJR Distal pulses intact with no bruits No edema Neuro non-focal Skin warm and dry No muscular weakness   Current Outpatient Prescriptions  Medication Sig Dispense Refill  . furosemide (LASIX) 40 MG tablet Take 20-40 mg by mouth as directed.    . benazepril (LOTENSIN) 20 MG tablet Take 0.5 tablets (10 mg total) by mouth daily. 15 tablet 6  . escitalopram (LEXAPRO) 10 MG tablet  Take 10 mg by mouth daily.    Marland Kitchen glipiZIDE (GLUCOTROL) 5 MG tablet Take 5 mg by mouth 2 (two) times daily before a meal.     . memantine (NAMENDA) 10 MG tablet Take 10 mg by mouth 2 (two) times daily.     . metFORMIN (GLUCOPHAGE) 500 MG tablet Take 500 mg by mouth 2 (two) times daily with a meal.     . metoprolol succinate (TOPROL-XL) 50 MG 24 hr tablet TAKE 1 TABLET BY MOUTH ONCE DAILY 30 tablet 2  . nitroGLYCERIN (NITROSTAT) 0.4 MG SL tablet Place 0.4 mg under the tongue every 5 (five) minutes as needed. For chest pain    . spironolactone (ALDACTONE) 25 MG tablet TAKE 1/2 TABLET BY MOUTH DAILY *MUST SCHEDULE APPOINTMENT FOR FURTHER REFILLS* 15 tablet 1  . warfarin (COUMADIN) 4 MG tablet TAKE AS DIRECTED BY COUMADIN CLINIC 30 tablet 3   No current facility-administered medications for this visit.    Allergies  Penicillins  Electrocardiogram:  afib v pacing  02/21/14   10/30/13 no change afib with v pacing rate 74   Assessment and Plan Afib: good rate control discussed NOAC since she has problems taking coumadin regularly but she enjoys coming to office and daughters Will let us know if they want to change  Anticoagulation  INR low today due to missed doses 1.6  HTN: Well controlled.  Continue current medications and low sodium Dash type diet.    DM: Discussed low carb diet.  Target hemoglobin A1c is 6.5 or less.  Continue  current medications.  Dementia stable f/u neuro at [facility] last 6 years Good support from 3 daughters   Takatsubo DCM: Resolved EF low normal continue current meds   Pacer:  Will arrange f/u Dr Ladona Ridgel home monitoring with normal function    Charlton Haws

## 2015-11-17 ENCOUNTER — Encounter: Payer: Self-pay | Admitting: Cardiovascular Disease

## 2015-11-17 ENCOUNTER — Ambulatory Visit (INDEPENDENT_AMBULATORY_CARE_PROVIDER_SITE_OTHER): Payer: Medicare Other | Admitting: Pharmacist

## 2015-11-17 ENCOUNTER — Ambulatory Visit (INDEPENDENT_AMBULATORY_CARE_PROVIDER_SITE_OTHER): Payer: Medicare Other | Admitting: Cardiovascular Disease

## 2015-11-17 VITALS — BP 110/68 | HR 94 | Ht 66.0 in | Wt 187.0 lb

## 2015-11-17 DIAGNOSIS — I4891 Unspecified atrial fibrillation: Secondary | ICD-10-CM | POA: Diagnosis not present

## 2015-11-17 DIAGNOSIS — I2510 Atherosclerotic heart disease of native coronary artery without angina pectoris: Secondary | ICD-10-CM

## 2015-11-17 DIAGNOSIS — Z7901 Long term (current) use of anticoagulants: Secondary | ICD-10-CM | POA: Diagnosis not present

## 2015-11-17 LAB — POCT INR: INR: 1.6

## 2015-11-17 MED ORDER — METOPROLOL SUCCINATE ER 50 MG PO TB24: 50.0000 mg | ORAL_TABLET | Freq: Every day | ORAL | Status: DC

## 2015-11-17 MED ORDER — SPIRONOLACTONE 25 MG PO TABS: ORAL_TABLET | ORAL | Status: DC

## 2015-11-17 NOTE — Patient Instructions (Signed)
Medication Instructions:  Your physician recommends that you continue on your current medications as directed. Please refer to the Current Medication list given to you today.  Labwork: NONE  Testing/Procedures: NONE  Follow-Up: Your physician recommends that you schedule a follow-up appointment with Dr. Ladona Ridgel for pacemaker check. Your physician wants you to follow-up in: 6 months with Dr. [name]. You will receive a reminder letter in the mail two months in advance. If you don't receive a letter, please call our office to schedule the follow-up appointment.   If you need a refill on your cardiac medications before your next appointment, please call your pharmacy.

## 2015-11-18 ENCOUNTER — Telehealth: Payer: Self-pay | Admitting: Cardiology

## 2015-11-18 ENCOUNTER — Ambulatory Visit (INDEPENDENT_AMBULATORY_CARE_PROVIDER_SITE_OTHER): Payer: Medicare Other | Admitting: *Deleted

## 2015-11-18 DIAGNOSIS — I495 Sick sinus syndrome: Secondary | ICD-10-CM

## 2015-11-18 NOTE — Telephone Encounter (Signed)
LMOVM reminding pt to send remote transmission.   

## 2015-11-20 NOTE — Progress Notes (Signed)
Remote pacemaker transmission.   

## 2015-12-02 ENCOUNTER — Ambulatory Visit (INDEPENDENT_AMBULATORY_CARE_PROVIDER_SITE_OTHER): Payer: Medicare Other | Admitting: Pharmacist

## 2015-12-02 DIAGNOSIS — Z7901 Long term (current) use of anticoagulants: Secondary | ICD-10-CM | POA: Diagnosis not present

## 2015-12-02 DIAGNOSIS — I4891 Unspecified atrial fibrillation: Secondary | ICD-10-CM

## 2015-12-02 LAB — POCT INR: INR: 1.6

## 2015-12-13 ENCOUNTER — Encounter: Payer: Self-pay | Admitting: Cardiology

## 2015-12-13 LAB — CUP PACEART REMOTE DEVICE CHECK
Battery Remaining Longevity: 66 mo
Battery Voltage: 2.78 V
Brady Statistic AP VS Percent: 0 %
Implantable Lead Implant Date: 20040213
Implantable Lead Location: 753859
Implantable Lead Location: 753860
Implantable Lead Model: 5076
Lead Channel Impedance Value: 473 Ohm
Lead Channel Impedance Value: 708 Ohm
MDC IDC LEAD IMPLANT DT: 20040213
MDC IDC MSMT BATTERY IMPEDANCE: 648 Ohm
MDC IDC SESS DTM: 20170209181529
MDC IDC SET LEADCHNL RA PACING AMPLITUDE: 3.5 V
MDC IDC SET LEADCHNL RV PACING AMPLITUDE: 2.5 V
MDC IDC SET LEADCHNL RV PACING PULSEWIDTH: 0.4 ms
MDC IDC SET LEADCHNL RV SENSING SENSITIVITY: 4 mV
MDC IDC STAT BRADY AP VP PERCENT: 69 %
MDC IDC STAT BRADY AS VP PERCENT: 29 %
MDC IDC STAT BRADY AS VS PERCENT: 2 %

## 2015-12-13 NOTE — Progress Notes (Signed)
Normal remote reviewed. +Warfarin  Next follow up 12/22/15 in clinic

## 2015-12-22 ENCOUNTER — Ambulatory Visit (INDEPENDENT_AMBULATORY_CARE_PROVIDER_SITE_OTHER): Payer: Medicare Other | Admitting: Internal Medicine

## 2015-12-22 ENCOUNTER — Ambulatory Visit (INDEPENDENT_AMBULATORY_CARE_PROVIDER_SITE_OTHER): Payer: Medicare Other | Admitting: Pharmacist

## 2015-12-22 ENCOUNTER — Encounter: Payer: Self-pay | Admitting: Internal Medicine

## 2015-12-22 VITALS — BP 128/70 | HR 92 | Ht 66.0 in | Wt 193.6 lb

## 2015-12-22 DIAGNOSIS — I482 Chronic atrial fibrillation, unspecified: Secondary | ICD-10-CM

## 2015-12-22 DIAGNOSIS — Z7901 Long term (current) use of anticoagulants: Secondary | ICD-10-CM

## 2015-12-22 DIAGNOSIS — I4891 Unspecified atrial fibrillation: Secondary | ICD-10-CM | POA: Diagnosis not present

## 2015-12-22 DIAGNOSIS — I495 Sick sinus syndrome: Secondary | ICD-10-CM

## 2015-12-22 LAB — CUP PACEART INCLINIC DEVICE CHECK
Battery Impedance: 674 Ohm
Battery Remaining Longevity: 66 mo
Battery Voltage: 2.78 V
Brady Statistic AS VP Percent: 29.4 %
Date Time Interrogation Session: 20170314105438
Implantable Lead Location: 753859
Implantable Lead Model: 5076
Implantable Lead Model: 5076
Lead Channel Impedance Value: 669 Ohm
Lead Channel Pacing Threshold Amplitude: 0.75 V
Lead Channel Setting Pacing Amplitude: 2.5 V
Lead Channel Setting Pacing Amplitude: 3.5 V
Lead Channel Setting Sensing Sensitivity: 4 mV
MDC IDC LEAD IMPLANT DT: 20040213
MDC IDC LEAD IMPLANT DT: 20040213
MDC IDC LEAD LOCATION: 753860
MDC IDC MSMT LEADCHNL RA IMPEDANCE VALUE: 455 Ohm
MDC IDC MSMT LEADCHNL RA SENSING INTR AMPL: 0.35 mV
MDC IDC MSMT LEADCHNL RV PACING THRESHOLD PULSEWIDTH: 0.4 ms
MDC IDC SET LEADCHNL RV PACING PULSEWIDTH: 0.4 ms
MDC IDC STAT BRADY AP VP PERCENT: 69.2 %
MDC IDC STAT BRADY AP VS PERCENT: 0.1 % — AB
MDC IDC STAT BRADY AS VS PERCENT: 1.5 %

## 2015-12-22 LAB — POCT INR: INR: 2.4

## 2015-12-22 NOTE — Patient Instructions (Signed)
Medication Instructions:  Your physician recommends that you continue on your current medications as directed. Please refer to the Current Medication list given to you today.   Labwork: None ordered   Testing/Procedures: None ordered   Follow-Up: Your physician wants you to follow-up in: 12 months with Dr Court Joy will receive a reminder letter in the mail two months in advance. If you don't receive a letter, please call our office to schedule the follow-up appointment.   Remote monitoring is used to monitor your Pacemaker from home. This monitoring reduces the number of office visits required to check your device to one time per year. It allows [place] to keep an eye on the functioning of your device to ensure it is working properly. You are scheduled for a device check from home on 03/22/16. You may send your transmission at any time that day. If you have a wireless device, the transmission will be sent automatically. After your physician reviews your transmission, you will receive a postcard with your next transmission date.    Any Other Special Instructions Will Be Listed Below (If Applicable).     If you need a refill on your cardiac medications before your next appointment, please call your pharmacy.

## 2015-12-22 NOTE — Progress Notes (Signed)
HPI Mrs. Papania returns today for followup. She is a pleasant 81 yo woman with a h/o symptomatic bradycardia, s/p PPM. She also has HTN and problems with anxiety. In the interim, she denies sob or syncope. She does have muscle aches in the shoulder, thought to be musculoskeletal pain. No other symptoms.  Allergies  Allergen Reactions  . Penicillins Rash    Happened many years ago     Current Outpatient Prescriptions  Medication Sig Dispense Refill  . benazepril (LOTENSIN) 20 MG tablet Take 0.5 tablets (10 mg total) by mouth daily. 15 tablet 6  . escitalopram (LEXAPRO) 10 MG tablet Take 10 mg by mouth daily.    . furosemide (LASIX) 40 MG tablet Take 20 mg by mouth every morning. Take 1/2 tablet by mouth in the morning and 1 tablet by mouth in the evening    . glipiZIDE (GLUCOTROL) 5 MG tablet Take 5 mg by mouth 2 (two) times daily before a meal.     . memantine (NAMENDA) 10 MG tablet Take 10 mg by mouth 2 (two) times daily.     . metFORMIN (GLUCOPHAGE) 500 MG tablet Take 500 mg by mouth 2 (two) times daily with a meal.     . metoprolol succinate (TOPROL-XL) 50 MG 24 hr tablet Take 1 tablet (50 mg total) by mouth daily. Take with or immediately following a meal. 90 tablet 3  . nitroGLYCERIN (NITROSTAT) 0.4 MG SL tablet Place 0.4 mg under the tongue every 5 (five) minutes as needed. For chest pain    . spironolactone (ALDACTONE) 25 MG tablet TAKE 1/2 TABLET BY MOUTH DAILY *MUST SCHEDULE APPOINTMENT FOR FURTHER REFILLS* 45 tablet 3  . warfarin (COUMADIN) 4 MG tablet TAKE AS DIRECTED BY COUMADIN CLINIC 30 tablet 3   No current facility-administered medications for this visit.     Past Medical History  Diagnosis Date  . HYPERCHOLESTEROLEMIA   . ANXIETY   . Takotsubo cardiomyopathy     2006  . CARDIOMYOPATHY     Nonischemic; nonobstructive CAD, EF 35-40% by cath 07/2010, EF 25% by echo 07/2010  . Atrial fibrillation (HCC)     on coumadin  . SICK SINUS SYNDROME     s/p Medtronic PPM '04,  generator change '12  . CHF   . GERD   . Diabetes mellitus   . Coronary artery disease     non obstructive  . Myocardial infarction (HCC)   . Hypertension   . Pacemaker     Medtronic  . Shortness of breath   . Arthritis   . Depression   . DEMENTIA     ROS:   All systems reviewed and negative except as noted in the HPI.   Past Surgical History  Procedure Laterality Date  . Coronary angioplasty    . Pacemaker insertion  2004, 2012    Medtronic   . Cervical spine surgery    . Back surgery    . Radical hysterectomy       Family History  Problem Relation Age of Onset  . Other      no known CAD     Social History   Social History  . Marital Status: Widowed    Spouse Name: N/A  . Number of Children: N/A  . Years of Education: N/A   Occupational History  . Not on file.   Social History Main Topics  . Smoking status: Never Smoker   . Smokeless tobacco: Never Used  . Alcohol Use: No  .  Drug Use: No  . Sexual Activity: No   Other Topics Concern  . Not on file   Social History Narrative     BP 128/70 mmHg  Pulse 92  Ht  (1.676 m)  Wt 193 lb 9.6 oz (87.816 kg)  BMI 31.26 kg/m2  Physical Exam:  Well appearing elderly woman, NAD HEENT: Unremarkable Neck:  6 cm JVD, no thyromegally Lungs:  Clear with no wheezes HEART:  Regular rate rhythm, no murmurs, no rubs, no clicks Abd:  soft, positive bowel sounds, no organomegally, no rebound, no guarding Ext:  2 plus pulses, no edema, no cyanosis, no clubbing Skin:  No rashes no nodules Neuro:  CN II through XII intact, motor grossly intact  DEVICE  Normal device function.  See PaceArt for details.   Assess/Plan: 1. Symptomatic sinus node dysfunction - she is s/p PPM and doing well.  2. HTN - her blood pressure is well controlled. Will follow. 3. Atrial fib - she is maintaining NSR. No change in meds.  Leonia Reeves.D.

## 2016-01-09 ENCOUNTER — Other Ambulatory Visit: Payer: Self-pay | Admitting: Cardiovascular Disease

## 2016-01-19 ENCOUNTER — Ambulatory Visit (INDEPENDENT_AMBULATORY_CARE_PROVIDER_SITE_OTHER): Payer: Medicare Other | Admitting: *Deleted

## 2016-01-19 DIAGNOSIS — Z7901 Long term (current) use of anticoagulants: Secondary | ICD-10-CM

## 2016-01-19 DIAGNOSIS — I482 Chronic atrial fibrillation, unspecified: Secondary | ICD-10-CM

## 2016-01-19 DIAGNOSIS — I4891 Unspecified atrial fibrillation: Secondary | ICD-10-CM | POA: Diagnosis not present

## 2016-01-19 LAB — POCT INR: INR: 3.5

## 2016-02-02 ENCOUNTER — Ambulatory Visit (INDEPENDENT_AMBULATORY_CARE_PROVIDER_SITE_OTHER): Payer: Medicare Other | Admitting: *Deleted

## 2016-02-02 DIAGNOSIS — I482 Chronic atrial fibrillation, unspecified: Secondary | ICD-10-CM

## 2016-02-02 DIAGNOSIS — I4891 Unspecified atrial fibrillation: Secondary | ICD-10-CM

## 2016-02-02 DIAGNOSIS — Z7901 Long term (current) use of anticoagulants: Secondary | ICD-10-CM

## 2016-02-02 LAB — POCT INR: INR: 1.6

## 2016-02-16 ENCOUNTER — Ambulatory Visit (INDEPENDENT_AMBULATORY_CARE_PROVIDER_SITE_OTHER): Payer: Medicare Other | Admitting: *Deleted

## 2016-02-16 DIAGNOSIS — I482 Chronic atrial fibrillation, unspecified: Secondary | ICD-10-CM

## 2016-02-16 DIAGNOSIS — Z7901 Long term (current) use of anticoagulants: Secondary | ICD-10-CM

## 2016-02-16 DIAGNOSIS — I4891 Unspecified atrial fibrillation: Secondary | ICD-10-CM

## 2016-02-16 LAB — POCT INR: INR: 3.1

## 2016-03-01 ENCOUNTER — Ambulatory Visit (INDEPENDENT_AMBULATORY_CARE_PROVIDER_SITE_OTHER): Payer: Medicare Other | Admitting: *Deleted

## 2016-03-01 DIAGNOSIS — I4891 Unspecified atrial fibrillation: Secondary | ICD-10-CM

## 2016-03-01 DIAGNOSIS — I482 Chronic atrial fibrillation, unspecified: Secondary | ICD-10-CM

## 2016-03-01 DIAGNOSIS — Z7901 Long term (current) use of anticoagulants: Secondary | ICD-10-CM

## 2016-03-01 LAB — POCT INR: INR: 1.9

## 2016-03-22 ENCOUNTER — Telehealth: Payer: Self-pay | Admitting: Cardiology

## 2016-03-22 ENCOUNTER — Encounter: Payer: Medicare Other | Admitting: *Deleted

## 2016-03-22 NOTE — Telephone Encounter (Signed)
LMOVM reminding pt to send remote transmission.   

## 2016-03-25 ENCOUNTER — Encounter: Payer: Self-pay | Admitting: Cardiology

## 2016-03-30 ENCOUNTER — Ambulatory Visit (INDEPENDENT_AMBULATORY_CARE_PROVIDER_SITE_OTHER): Payer: Medicare Other | Admitting: *Deleted

## 2016-03-30 DIAGNOSIS — I482 Chronic atrial fibrillation, unspecified: Secondary | ICD-10-CM

## 2016-03-30 DIAGNOSIS — Z7901 Long term (current) use of anticoagulants: Secondary | ICD-10-CM

## 2016-03-30 DIAGNOSIS — I4891 Unspecified atrial fibrillation: Secondary | ICD-10-CM

## 2016-03-30 LAB — POCT INR: INR: 1.4

## 2016-04-11 ENCOUNTER — Ambulatory Visit (INDEPENDENT_AMBULATORY_CARE_PROVIDER_SITE_OTHER): Payer: Medicare Other | Admitting: *Deleted

## 2016-04-11 DIAGNOSIS — I482 Chronic atrial fibrillation, unspecified: Secondary | ICD-10-CM

## 2016-04-11 DIAGNOSIS — Z7901 Long term (current) use of anticoagulants: Secondary | ICD-10-CM | POA: Diagnosis not present

## 2016-04-11 DIAGNOSIS — I4891 Unspecified atrial fibrillation: Secondary | ICD-10-CM

## 2016-04-11 DIAGNOSIS — I495 Sick sinus syndrome: Secondary | ICD-10-CM | POA: Diagnosis not present

## 2016-04-11 LAB — POCT INR: INR: 2.5

## 2016-04-11 NOTE — Progress Notes (Signed)
Remote pacemaker transmission.   

## 2016-04-13 LAB — CUP PACEART REMOTE DEVICE CHECK
Battery Remaining Longevity: 66 mo
Brady Statistic RV Percent Paced: 100 %
Date Time Interrogation Session: 20170702220435
Implantable Lead Implant Date: 20040213
Implantable Lead Implant Date: 20040213
Implantable Lead Location: 753860
Lead Channel Impedance Value: 67 Ohm
Lead Channel Impedance Value: 713 Ohm
Lead Channel Setting Pacing Amplitude: 2.5 V
Lead Channel Setting Pacing Pulse Width: 0.4 ms
MDC IDC LEAD LOCATION: 753859
MDC IDC MSMT BATTERY IMPEDANCE: 829 Ohm
MDC IDC MSMT BATTERY VOLTAGE: 2.78 V
MDC IDC MSMT LEADCHNL RV PACING THRESHOLD AMPLITUDE: 0.75 V
MDC IDC MSMT LEADCHNL RV PACING THRESHOLD PULSEWIDTH: 0.4 ms
MDC IDC SET LEADCHNL RV SENSING SENSITIVITY: 4 mV

## 2016-04-15 ENCOUNTER — Encounter: Payer: Self-pay | Admitting: Cardiology

## 2016-06-17 ENCOUNTER — Other Ambulatory Visit: Payer: Self-pay | Admitting: Cardiovascular Disease

## 2016-06-17 ENCOUNTER — Telehealth: Payer: Self-pay | Admitting: *Deleted

## 2016-06-17 NOTE — Telephone Encounter (Signed)
Spoke with dtr-Chris & she stated the pt has been refusing to take her Coumadin and other medications therefore they have not brought her in to see [facility] because of that.  Also, she stated that the has had multiple falls & they have Notified Cardiologist/PCP.  She stated that the other dtr will be present at the pt with the pt on 06/20/16 appt with [facility] & they will discuss whether or not she will stay on Coumadin.  We have made an appt in the [facility] office after Nishan's appt if they decide if the pt will stay on Coumadin.

## 2016-06-17 NOTE — Progress Notes (Deleted)
Patient ID: Kayla NineHelen M Michael, female   DOB: 02-13-1935, 81 y.o.   MRN: [id number] [name] is a  81 y.o.  Caucasian female with PMHx significant for CAD (nonobsructive CAD by cath 2011; EF 35-40%), Takatsubo CM (2006), NICM (EF 25% per 10/11 echo), atrial fibrillation (on Coumadin), SSS (s/p PPM 2004), HTN, HL, type 2 DM and anxiety Hospitalization 01/31/12 for atypical chest pain R/O. Resides at assisted living now. Dementia and psychiatric illness makes her hard to care for per daughter. LE edema from varicose veings. Long discussion with her about not dying from her heart. Also discussed the fact that her pacer cannot come out She has fallen a few times Discussed possibly needing to stop coumadin  Remote pacer check 04/11/16  reviewed and normal   Having falls and not taking coumadin    ROS: Denies fever, malais, weight loss, blurry vision, decreased visual acuity, cough, sputum, SOB, hemoptysis, pleuritic pain, palpitaitons, heartburn, abdominal pain, melena, lower extremity edema, claudication, or rash.  All other systems reviewed and negative  General: Excitable  Healthy:  appears stated age HEENT: normal Neck supple with no adenopathy JVP normal no bruits no thyromegaly Lungs clear with no wheezing and good diaphragmatic motion Heart:  S1/S2 no murmur, no rub, gallop or click PMI normal Abdomen: benighn, BS positve, no tenderness, no AAA no bruit.  No HSM or HJR Distal pulses intact with no bruits No edema Neuro non-focal Skin warm and dry No muscular weakness   Current Outpatient Prescriptions  Medication Sig Dispense Refill  . benazepril (LOTENSIN) 20 MG tablet Take 0.5 tablets (10 mg total) by mouth daily. 15 tablet 6  . escitalopram (LEXAPRO) 10 MG tablet Take 10 mg by mouth daily.    . furosemide (LASIX) 40 MG tablet Take 20 mg by mouth every morning. Take 1/2 tablet by mouth in the morning and 1 tablet by mouth in the evening    . furosemide (LASIX) 40 MG tablet TAKE 1/2  TABLET BY MOUTH IN THE MORNING AND 1 IN THE EVENING 45 tablet 11  . glipiZIDE (GLUCOTROL) 5 MG tablet Take 5 mg by mouth 2 (two) times daily before a meal.     . memantine (NAMENDA) 10 MG tablet Take 10 mg by mouth 2 (two) times daily.     . metFORMIN (GLUCOPHAGE) 500 MG tablet Take 500 mg by mouth 2 (two) times daily with a meal.     . metoprolol succinate (TOPROL-XL) 50 MG 24 hr tablet Take 1 tablet (50 mg total) by mouth daily. Take with or immediately following a meal. 90 tablet 3  . nitroGLYCERIN (NITROSTAT) 0.4 MG SL tablet Place 0.4 mg under the tongue every 5 (five) minutes as needed. For chest pain    . spironolactone (ALDACTONE) 25 MG tablet TAKE 1/2 TABLET BY MOUTH DAILY *MUST SCHEDULE APPOINTMENT FOR FURTHER REFILLS* 45 tablet 3  . warfarin (COUMADIN) 4 MG tablet TAKE AS DIRECTED BY COUMADIN CLINIC 30 tablet 3   No current facility-administered medications for this visit.     Allergies  Penicillins  Electrocardiogram:  afib v pacing  02/21/14   10/30/13 no change afib with v pacing rate 74   Assessment and Plan Afib: good rate control    Anticoagulation  Falls and compliance issues   HTN: Well controlled.  Continue current medications and low sodium Dash type diet.    DM: Discussed low carb diet.  Target hemoglobin A1c is 6.5 or less.  Continue current medications.  Dementia stable f/u  neuro at [facility] last 6 years Good support from 3 daughters   Takatsubo DCM: Resolved EF low normal continue current meds   Pacer:  Will arrange f/u Dr Ladona Ridgel home monitoring with normal function    Charlton Haws

## 2016-06-18 ENCOUNTER — Encounter (HOSPITAL_COMMUNITY): Payer: Self-pay | Admitting: Emergency Medicine

## 2016-06-18 ENCOUNTER — Emergency Department (HOSPITAL_COMMUNITY): Payer: Medicare Other

## 2016-06-18 ENCOUNTER — Emergency Department (HOSPITAL_COMMUNITY)
Admission: EM | Admit: 2016-06-18 | Discharge: 2016-06-18 | Disposition: A | Discharge status: Home / Self Care | Payer: Medicare Other | Attending: Emergency Medicine | Admitting: Emergency Medicine

## 2016-06-18 DIAGNOSIS — Z7984 Long term (current) use of oral hypoglycemic drugs: Secondary | ICD-10-CM | POA: Insufficient documentation

## 2016-06-18 DIAGNOSIS — Z950 Presence of cardiac pacemaker: Secondary | ICD-10-CM | POA: Insufficient documentation

## 2016-06-18 DIAGNOSIS — I509 Heart failure, unspecified: Secondary | ICD-10-CM | POA: Insufficient documentation

## 2016-06-18 DIAGNOSIS — Z7901 Long term (current) use of anticoagulants: Secondary | ICD-10-CM | POA: Insufficient documentation

## 2016-06-18 DIAGNOSIS — Z79899 Other long term (current) drug therapy: Secondary | ICD-10-CM | POA: Diagnosis not present

## 2016-06-18 DIAGNOSIS — E119 Type 2 diabetes mellitus without complications: Secondary | ICD-10-CM | POA: Insufficient documentation

## 2016-06-18 DIAGNOSIS — Z9861 Coronary angioplasty status: Secondary | ICD-10-CM | POA: Diagnosis not present

## 2016-06-18 DIAGNOSIS — M79671 Pain in right foot: Secondary | ICD-10-CM | POA: Diagnosis not present

## 2016-06-18 DIAGNOSIS — I2510 Atherosclerotic heart disease of native coronary artery without angina pectoris: Secondary | ICD-10-CM | POA: Insufficient documentation

## 2016-06-18 DIAGNOSIS — I110 Hypertensive heart disease with heart failure: Secondary | ICD-10-CM | POA: Diagnosis not present

## 2016-06-18 LAB — PROTIME-INR
INR: 2.06
PROTHROMBIN TIME: 23.5 s — AB (ref 11.4–15.2)

## 2016-06-18 MED ORDER — ACETAMINOPHEN 500 MG PO TABS: 500.0000 mg | ORAL_TABLET | Freq: Four times a day (QID) | ORAL | 0 refills | Status: AC | PRN

## 2016-06-18 MED ORDER — ACETAMINOPHEN 325 MG PO TABS
650.0000 mg | ORAL_TABLET | Freq: Once | ORAL | Status: AC
  Administered 2016-06-18: 650 mg via ORAL
  Filled 2016-06-18: qty 2

## 2016-06-18 NOTE — ED Triage Notes (Signed)
Brought in by EMS from [facility] facility with c/o right foot pain.  Pt reports progressive and worsening right foot pain and swelling.  Pt denies injury or trauma or fall.  Pt has hx of gout.

## 2016-06-18 NOTE — Discharge Instructions (Signed)
Your INR here today was 2.06. Please call the clinic and let them know her INR and they will let you know of any dosing changes they would like.

## 2016-06-18 NOTE — ED Notes (Signed)
Lab called to inquire of results of PT-INR; was told that results should come across in approximately 5 minutes

## 2016-06-18 NOTE — ED Notes (Signed)
Bed: WA09 Expected date:  Expected time:  Means of arrival:  Comments: 89F foot pain hx gout

## 2016-06-18 NOTE — ED Notes (Signed)
Called lab 2nd time to inquire about lab; sample was placed on machine approximately 20 minutes ago per lab; as discussion was taking place on phone about how long this was taking, lab resulted per lab tech

## 2016-06-18 NOTE — ED Provider Notes (Signed)
[facility]-EMERGENCY DEPT Provider Note   CSN: [id number] Arrival date & time: 06/18/16  0553     History   Chief Complaint Chief Complaint  Patient presents with  . Foot Pain    HPI [name] is a 81 y.o. female.  [name] is a 81 y.o. Female who presents to the emergency Department from [facility] nursing home facility complaining of right foot pain for the past several days. Patient reports over the past several days she's had worsening right foot pain and swelling. She does not believe she is injured her foot. She tells me "I don't think I have injured it." She denies any falls recently. Nursing home reported a history of gout, the patient is unsure of gout history. She also reports the slight red rash to her bilateral shins is chronic and ongoing and not changed. She has taken nothing for treatment of her symptoms today. She denies calf pain or swelling. She denies fevers, new rashes, chest pain, SOB, trauma, falls.    The history is provided by the patient and the nursing home. No language interpreter was used.  Foot Pain  Pertinent negatives include no chest pain, no abdominal pain and no shortness of breath.    Past Medical History:  Diagnosis Date  . ANXIETY   . Arthritis   . Atrial fibrillation (HCC)    on coumadin  . CARDIOMYOPATHY    Nonischemic; nonobstructive CAD, EF 35-40% by cath 07/2010, EF 25% by echo 07/2010  . CHF   . Coronary artery disease    non obstructive  . DEMENTIA   . Depression   . Diabetes mellitus   . GERD   . HYPERCHOLESTEROLEMIA   . Hypertension   . Myocardial infarction (HCC)   . Pacemaker    Medtronic  . Shortness of breath   . SICK SINUS SYNDROME    s/p Medtronic PPM '04, generator change '12  . Takotsubo cardiomyopathy    2006    Patient Active Problem List   Diagnosis Date Noted  . Chest pain 02/01/2012  . Hypertension 02/01/2012  . Type 2 diabetes mellitus (HCC) 02/01/2012  . Pacemaker 02/02/2011  . Long term  (current) use of anticoagulants 01/11/2011  . HYPERCHOLESTEROLEMIA 01/28/2009  . ANXIETY 01/28/2009  . MYOCARDIAL INFARCTION 01/28/2009  . Coronary atherosclerosis 01/28/2009  . CARDIOMYOPATHY 01/28/2009  . PAROXYSMAL ATRIAL FIBRILLATION 01/28/2009  . SICK SINUS SYNDROME 01/28/2009  . BRADYCARDIA 01/28/2009  . CHF 01/28/2009  . GERD 01/28/2009  . OSTEOARTHRITIS 01/28/2009  . EDEMA 01/28/2009  . SHORTNESS OF BREATH 01/28/2009    Past Surgical History:  Procedure Laterality Date  . BACK SURGERY    . CERVICAL SPINE SURGERY    . CORONARY ANGIOPLASTY    . PACEMAKER INSERTION  2004, 2012   Medtronic   . RADICAL HYSTERECTOMY      OB History    No data available       Home Medications    Prior to Admission medications   Medication Sig Start Date End Date Taking? Authorizing Provider  benazepril (LOTENSIN) 20 MG tablet Take 0.5 tablets (10 mg total) by mouth daily. 05/05/15  Yes Wendall Stade, MD  escitalopram (LEXAPRO) 10 MG tablet Take 10 mg by mouth daily.   Yes Historical Provider, MD  furosemide (LASIX) 40 MG tablet TAKE 1/2 TABLET BY MOUTH IN THE MORNING AND 1 IN THE EVENING 01/12/16  Yes Marinus Maw, MD  glipiZIDE (GLUCOTROL) 5 MG tablet Take 5 mg by  mouth 2 (two) times daily before a meal.  12/19/13  Yes Historical Provider, MD  memantine (NAMENDA) 10 MG tablet Take 10 mg by mouth 2 (two) times daily.    Yes Historical Provider, MD  metFORMIN (GLUCOPHAGE) 500 MG tablet Take 500 mg by mouth 2 (two) times daily with a meal.  12/19/13  Yes Historical Provider, MD  metoprolol succinate (TOPROL-XL) 50 MG 24 hr tablet Take 1 tablet (50 mg total) by mouth daily. Take with or immediately following a meal. 11/17/15  Yes Wendall StadePeter C Nishan, MD  nitroGLYCERIN (NITROSTAT) 0.4 MG SL tablet Place 0.4 mg under the tongue every 5 (five) minutes as needed. For chest pain   Yes Historical Provider, MD  spironolactone (ALDACTONE) 25 MG tablet TAKE 1/2 TABLET BY MOUTH DAILY *MUST SCHEDULE APPOINTMENT  FOR FURTHER REFILLS* 11/17/15  Yes Wendall StadePeter C Nishan, MD  warfarin (COUMADIN) 4 MG tablet TAKE AS DIRECTED BY COUMADIN CLINIC Patient taking differently: Take 2mg s on Sundays and Thursdays on all other days of the week take 4mg s 10/14/15  Yes Wendall StadePeter C Nishan, MD  acetaminophen (TYLENOL) 500 MG tablet Take 1 tablet (500 mg total) by mouth every 6 (six) hours as needed for mild pain or moderate pain. 06/18/16   Everlene FarrierWilliam Cathey Fredenburg, PA-C    Family History Family History  Problem Relation Age of Onset  . Other      no known CAD    Social History Social History  Substance Use Topics  . Smoking status: Never Smoker  . Smokeless tobacco: Never Used  . Alcohol use No     Allergies   Penicillins   Review of Systems Review of Systems  Constitutional: Negative for fever.  Respiratory: Negative for cough and shortness of breath.   Cardiovascular: Negative for chest pain and leg swelling.  Gastrointestinal: Negative for abdominal pain.  Musculoskeletal: Positive for arthralgias and joint swelling.  Skin: Negative for rash.  Neurological: Negative for weakness and numbness.     Physical Exam Updated Vital Signs BP 142/59 (BP Location: Right Arm)   Pulse 63   Temp 98 F (36.7 C) (Oral)   Resp 17   Wt 81.6 kg   SpO2 98%   BMI 29.05 kg/m   Physical Exam  Constitutional: She is oriented to person, place, and time. She appears well-developed and well-nourished. No distress.  HENT:  Head: Normocephalic and atraumatic.  Eyes: Right eye exhibits no discharge. Left eye exhibits no discharge.  Cardiovascular: Normal rate, regular rhythm and intact distal pulses.   Bilateral dorsalis pedis pulses are intact. Good capillary refill to her bilateral distal toes.  Pulmonary/Chest: Effort normal. No respiratory distress.  Musculoskeletal: Normal range of motion. She exhibits edema and tenderness. She exhibits no deformity.  Patient has right pedal edema and erythema and some ecchymosis. No warmth. TTP  to dorsum of foot and some to lateral ankle.  Good range of motion of her distal toes and right ankle. No obvious deformity. No calf edema or tenderness bilaterally.  Neurological: She is alert and oriented to person, place, and time. Coordination normal.  Sensation is intact to her bilateral distal toes. The patient is alert and oriented 3.  Skin: Skin is warm and dry. Capillary refill takes less than 2 seconds. No rash noted. She is not diaphoretic. There is erythema. No pallor.  Patient does have some mild erythema to her bilateral shins that she reports is unchanged. No warmth.  Psychiatric: She has a normal mood and affect. Her behavior is normal.  Nursing note and vitals reviewed.    ED Treatments / Results  Labs (all labs ordered are listed, but only abnormal results are displayed) Labs Reviewed  PROTIME-INR - Abnormal; Notable for the following:       Result Value   Prothrombin Time 23.5 (*)    All other components within normal limits    EKG  EKG Interpretation None       Radiology Dg Foot Complete Right  Result Date: 06/18/2016 CLINICAL DATA:  Patient with right foot pain, progressive. Associated swelling. History of gout. EXAM: RIGHT FOOT COMPLETE - 3+ VIEW COMPARISON:  None. FINDINGS: Normal anatomic alignment. No evidence for acute fracture or dislocation. Posterior and plantar calcaneal spurring. Vascular calcifications. Midfoot degenerative changes. IMPRESSION: No acute osseous abnormality. Electronically Signed   By: Annia Belt M.D.   On: 06/18/2016 07:10    Procedures Procedures (including critical care time)  Medications Ordered in ED Medications  acetaminophen (TYLENOL) tablet 650 mg (650 mg Oral Given 06/18/16 0817)     Initial Impression / Assessment and Plan / ED Course  I have reviewed the triage vital signs and the nursing notes.  Pertinent labs & imaging results that were available during my care of the patient were reviewed by me and considered  in my medical decision making (see chart for details).  Clinical Course   This  is a 81 y.o. Female who presents to the emergency Department from [facility] nursing home facility complaining of right foot pain for the past several days. Patient reports over the past several days she's had worsening right foot pain and swelling. She does not believe she is injured her foot. She tells me "I don't think I have injured it." She denies any falls recently. Nursing home reported a history of gout, the patient is unsure of gout history. She also reports the slight red rash to her bilateral shins is chronic and ongoing and not changed.  On exam the patient is afebrile nontoxic appearing. She has swelling noted to her foot and ankle with some ecchymosis noted. No deformity. She is neurovascularly intact. No calf edema or tenderness. X-ray of her right foot is unremarkable. A she denies any injury or trauma but the exam is concerning for possible foot or ankle sprain. Will place an ASO ankle brace and have her follow-up with her primary care doctor. Family at bedside reports she is due to have her INR checked in acid we could check this year. INR is 2.06. I advised to call her clinic to advise of this INR and they can adjust her medicines as needed. I advised the patient to follow-up with their primary care provider this week. I advised the patient to return to the emergency department with new or worsening symptoms or new concerns. The patient and her daughter verbalized understanding and agreement with plan.    This patient was discussed with and evaluated by Dr. Anitra Lauth who agrees with assessment and plan.   Final Clinical Impressions(s) / ED Diagnoses   Final diagnoses:  Right foot pain    New Prescriptions Discharge Medication List as of 06/18/2016 10:35 AM       Everlene Farrier, PA-C 06/18/16 1050    Gwyneth Sprout, MD 06/18/16 2047

## 2016-06-20 ENCOUNTER — Ambulatory Visit: Payer: Medicare Other | Admitting: Cardiovascular Disease

## 2016-07-04 ENCOUNTER — Ambulatory Visit (INDEPENDENT_AMBULATORY_CARE_PROVIDER_SITE_OTHER): Payer: Medicare Other | Admitting: *Deleted

## 2016-07-04 DIAGNOSIS — Z7901 Long term (current) use of anticoagulants: Secondary | ICD-10-CM

## 2016-07-04 DIAGNOSIS — I4891 Unspecified atrial fibrillation: Secondary | ICD-10-CM | POA: Diagnosis not present

## 2016-07-04 LAB — POCT INR: INR: 1.2

## 2016-07-11 ENCOUNTER — Telehealth: Payer: Self-pay | Admitting: Cardiology

## 2016-07-11 ENCOUNTER — Encounter: Payer: Medicare Other | Admitting: *Deleted

## 2016-07-11 NOTE — Telephone Encounter (Signed)
Confirmed remote transmission w/ pt daughter.   

## 2016-07-14 ENCOUNTER — Ambulatory Visit (INDEPENDENT_AMBULATORY_CARE_PROVIDER_SITE_OTHER): Payer: Medicare Other | Admitting: *Deleted

## 2016-07-14 DIAGNOSIS — Z7901 Long term (current) use of anticoagulants: Secondary | ICD-10-CM | POA: Diagnosis not present

## 2016-07-14 DIAGNOSIS — I4891 Unspecified atrial fibrillation: Secondary | ICD-10-CM

## 2016-07-14 LAB — POCT INR: INR: 1.1

## 2016-07-14 MED ORDER — WARFARIN SODIUM 4 MG PO TABS: ORAL_TABLET | ORAL | 3 refills | Status: DC

## 2016-07-15 ENCOUNTER — Encounter: Payer: Self-pay | Admitting: Cardiology

## 2016-09-21 NOTE — Progress Notes (Signed)
Patient ID: [name], female   DOB: [birth year], 81 y.o.   MRN: [id number] [name] is a  81 y.o.  Caucasian female with PMHx significant for CAD (nonobsructive CAD by cath 2011; EF 35-40%), Takatsubo CM (2006), NICM (EF 25% per 10/11 echo), atrial fibrillation (on Coumadin), SSS (s/p PPM 2004), HTN, HL, type 2 DM and anxiety Hospitalization 01/31/12 for atypical chest pain R/O. Resides at assisted living now. Dementia and psychiatric illness makes her hard to care for per daughter. LE edema from varicose veings. Long discussion with her about not dying from her heart. Also discussed the fact that her pacer cannot come out She has fallen a few times Discussed possibly needing to stop coumadin in future On klonipan instead xanax and this may be contributing   Not exercising as much Family having a difficult time with her Not falling that much  Remote pacer check 07/11/16  reviewed and normal   Only one bad fall since last visit    ROS: Denies fever, malais, weight loss, blurry vision, decreased visual acuity, cough, sputum, SOB, hemoptysis, pleuritic pain, palpitaitons, heartburn, abdominal pain, melena, lower extremity edema, claudication, or rash.  All other systems reviewed and negative  General: Excitable  Healthy:  appears stated age HEENT: normal Neck supple with no adenopathy JVP normal no bruits no thyromegaly Lungs clear with no wheezing and good diaphragmatic motion Heart:  S1/S2 no murmur, no rub, gallop or click PMI normal Abdomen: benighn, BS positve, no tenderness, no AAA no bruit.  No HSM or HJR Distal pulses intact with no bruits No edema Neuro non-focal Skin warm and dry No muscular weakness   Current Outpatient Prescriptions  Medication Sig Dispense Refill  . acetaminophen (TYLENOL) 500 MG tablet Take 1 tablet (500 mg total) by mouth every 6 (six) hours as needed for mild pain or moderate pain. 60 tablet 0  . benazepril (LOTENSIN) 20 MG tablet Take 0.5 tablets  (10 mg total) by mouth daily. 15 tablet 6  . escitalopram (LEXAPRO) 10 MG tablet Take 10 mg by mouth daily.    . furosemide (LASIX) 40 MG tablet TAKE 1/2 TABLET BY MOUTH IN THE MORNING AND 1 IN THE EVENING 45 tablet 11  . glipiZIDE (GLUCOTROL) 5 MG tablet Take 5 mg by mouth 2 (two) times daily before a meal.     . memantine (NAMENDA) 10 MG tablet Take 10 mg by mouth 2 (two) times daily.     . metFORMIN (GLUCOPHAGE) 500 MG tablet Take 500 mg by mouth 2 (two) times daily with a meal.     . metoprolol succinate (TOPROL-XL) 50 MG 24 hr tablet Take 1 tablet (50 mg total) by mouth daily. Take with or immediately following a meal. 90 tablet 3  . nitroGLYCERIN (NITROSTAT) 0.4 MG SL tablet Place 0.4 mg under the tongue every 5 (five) minutes as needed. For chest pain    . spironolactone (ALDACTONE) 25 MG tablet TAKE 1/2 TABLET BY MOUTH DAILY *MUST SCHEDULE APPOINTMENT FOR FURTHER REFILLS* 45 tablet 3  . apixaban (ELIQUIS) 2.5 MG TABS tablet Take 1 tablet (2.5 mg total) by mouth 2 (two) times daily. 60 tablet 11   No current facility-administered medications for this visit.     Allergies  Penicillins  Electrocardiogram:  afib v pacing  02/21/14   10/30/13 no change afib with v pacing rate 74  A sesed V paced rate 89  09/22/16  Assessment and Plan Afib: appears to be in SR today not taking coumadin  will change to eliquis 2.5 bid Daughter agrees   Anticoagulation   Lab Results  Component Value Date   INR 1.1 07/14/2016   INR 1.2 07/04/2016   INR 2.06 06/18/2016    HTN: Well controlled.  Continue current medications and low sodium Dash type diet.    DM: Discussed low carb diet.  Target hemoglobin A1c is 6.5 or less.  Continue current medications.  Dementia stable f/u neuro at [facility] last 6 years Good support from 3 daughters   Takatsubo DCM: Resolved EF low normal continue current meds   Pacer:  Will arrange f/u Dr Ladona Ridgelaylor home monitoring with normal function    Charlton HawsPeter Nishan

## 2016-09-22 ENCOUNTER — Ambulatory Visit (INDEPENDENT_AMBULATORY_CARE_PROVIDER_SITE_OTHER): Payer: Medicare Other | Admitting: Cardiovascular Disease

## 2016-09-22 ENCOUNTER — Ambulatory Visit: Payer: Self-pay | Admitting: Cardiovascular Disease

## 2016-09-22 ENCOUNTER — Encounter (INDEPENDENT_AMBULATORY_CARE_PROVIDER_SITE_OTHER): Payer: Self-pay

## 2016-09-22 ENCOUNTER — Encounter: Payer: Self-pay | Admitting: Cardiovascular Disease

## 2016-09-22 VITALS — BP 150/80 | HR 91 | Ht 66.0 in | Wt 193.0 lb

## 2016-09-22 DIAGNOSIS — I482 Chronic atrial fibrillation, unspecified: Secondary | ICD-10-CM

## 2016-09-22 MED ORDER — APIXABAN 2.5 MG PO TABS: 2.5000 mg | ORAL_TABLET | Freq: Two times a day (BID) | ORAL | 11 refills | Status: DC

## 2016-09-22 NOTE — Patient Instructions (Addendum)
Medication Instructions:  Your physician has recommended you make the following change in your medication:  1-STOP Coumadin  2-START Eliquis 2.5 mg by mouth twice daily  Labwork: NONE  Testing/Procedures: NONE  Follow-Up: Your physician wants you to follow-up in: 6 months with Dr. [name]. You will receive a reminder letter in the mail two months in advance. If you don't receive a letter, please call our office to schedule the follow-up appointment.   If you need a refill on your cardiac medications before your next appointment, please call your pharmacy.

## 2016-09-26 ENCOUNTER — Telehealth: Payer: Self-pay

## 2016-09-26 NOTE — Telephone Encounter (Signed)
Prior auth for Eliquis 2.5mg submitted to [facility]. 

## 2016-09-27 ENCOUNTER — Telehealth: Payer: Self-pay

## 2016-09-27 NOTE — Telephone Encounter (Signed)
Eliquis 2.5mg  approved by [employer]. [id number] and is valid through 10/09/2017.

## 2016-12-31 ENCOUNTER — Other Ambulatory Visit: Payer: Self-pay | Admitting: Cardiovascular Disease

## 2017-01-06 ENCOUNTER — Other Ambulatory Visit: Payer: Self-pay | Admitting: Cardiovascular Disease

## 2017-02-17 ENCOUNTER — Emergency Department (HOSPITAL_COMMUNITY)
Admission: EM | Admit: 2017-02-17 | Discharge: 2017-02-18 | Disposition: A | Discharge status: Home / Self Care | Payer: Medicare Other | Attending: Emergency Medicine | Admitting: Emergency Medicine

## 2017-02-17 ENCOUNTER — Encounter (HOSPITAL_COMMUNITY): Payer: Self-pay | Admitting: Emergency Medicine

## 2017-02-17 ENCOUNTER — Telehealth: Payer: Self-pay | Admitting: Cardiovascular Disease

## 2017-02-17 ENCOUNTER — Emergency Department (HOSPITAL_COMMUNITY): Payer: Medicare Other

## 2017-02-17 DIAGNOSIS — I110 Hypertensive heart disease with heart failure: Secondary | ICD-10-CM | POA: Insufficient documentation

## 2017-02-17 DIAGNOSIS — Z7901 Long term (current) use of anticoagulants: Secondary | ICD-10-CM | POA: Insufficient documentation

## 2017-02-17 DIAGNOSIS — I509 Heart failure, unspecified: Secondary | ICD-10-CM | POA: Diagnosis not present

## 2017-02-17 DIAGNOSIS — Z7984 Long term (current) use of oral hypoglycemic drugs: Secondary | ICD-10-CM | POA: Insufficient documentation

## 2017-02-17 DIAGNOSIS — E119 Type 2 diabetes mellitus without complications: Secondary | ICD-10-CM | POA: Insufficient documentation

## 2017-02-17 DIAGNOSIS — Z950 Presence of cardiac pacemaker: Secondary | ICD-10-CM | POA: Insufficient documentation

## 2017-02-17 DIAGNOSIS — I252 Old myocardial infarction: Secondary | ICD-10-CM | POA: Diagnosis not present

## 2017-02-17 DIAGNOSIS — Z79899 Other long term (current) drug therapy: Secondary | ICD-10-CM | POA: Insufficient documentation

## 2017-02-17 DIAGNOSIS — R4182 Altered mental status, unspecified: Secondary | ICD-10-CM | POA: Diagnosis not present

## 2017-02-17 DIAGNOSIS — I2510 Atherosclerotic heart disease of native coronary artery without angina pectoris: Secondary | ICD-10-CM | POA: Insufficient documentation

## 2017-02-17 LAB — I-STAT CHEM 8, ED
BUN: 32 mg/dL — AB (ref 6–20)
CREATININE: 1.1 mg/dL — AB (ref 0.44–1.00)
Calcium, Ion: 1.06 mmol/L — ABNORMAL LOW (ref 1.15–1.40)
Chloride: 103 mmol/L (ref 101–111)
Glucose, Bld: 156 mg/dL — ABNORMAL HIGH (ref 65–99)
HEMATOCRIT: 32 % — AB (ref 36.0–46.0)
Hemoglobin: 10.9 g/dL — ABNORMAL LOW (ref 12.0–15.0)
Potassium: 4.1 mmol/L (ref 3.5–5.1)
Sodium: 142 mmol/L (ref 135–145)
TCO2: 29 mmol/L (ref 0–100)

## 2017-02-17 LAB — DIFFERENTIAL
Basophils Absolute: 0 10*3/uL (ref 0.0–0.1)
Basophils Relative: 1 %
EOS PCT: 2 %
Eosinophils Absolute: 0.1 10*3/uL (ref 0.0–0.7)
LYMPHS ABS: 1.7 10*3/uL (ref 0.7–4.0)
LYMPHS PCT: 19 %
MONO ABS: 0.8 10*3/uL (ref 0.1–1.0)
Monocytes Relative: 9 %
Neutro Abs: 6.1 10*3/uL (ref 1.7–7.7)
Neutrophils Relative %: 69 %

## 2017-02-17 LAB — CBC
HCT: 34.2 % — ABNORMAL LOW (ref 36.0–46.0)
Hemoglobin: 11 g/dL — ABNORMAL LOW (ref 12.0–15.0)
MCH: 30.2 pg (ref 26.0–34.0)
MCHC: 32.2 g/dL (ref 30.0–36.0)
MCV: 94 fL (ref 78.0–100.0)
Platelets: 328 10*3/uL (ref 150–400)
RBC: 3.64 MIL/uL — AB (ref 3.87–5.11)
RDW: 14.1 % (ref 11.5–15.5)
WBC: 8.8 10*3/uL (ref 4.0–10.5)

## 2017-02-17 LAB — URINALYSIS, ROUTINE W REFLEX MICROSCOPIC
Bilirubin Urine: NEGATIVE
Glucose, UA: NEGATIVE mg/dL
Hgb urine dipstick: NEGATIVE
Ketones, ur: NEGATIVE mg/dL
Nitrite: NEGATIVE
PROTEIN: NEGATIVE mg/dL
SPECIFIC GRAVITY, URINE: 1.013 (ref 1.005–1.030)
pH: 5 (ref 5.0–8.0)

## 2017-02-17 LAB — COMPREHENSIVE METABOLIC PANEL
ALK PHOS: 73 U/L (ref 38–126)
ALT: 11 U/L — ABNORMAL LOW (ref 14–54)
ANION GAP: 10 (ref 5–15)
AST: 15 U/L (ref 15–41)
Albumin: 3.8 g/dL (ref 3.5–5.0)
BILIRUBIN TOTAL: 0.5 mg/dL (ref 0.3–1.2)
BUN: 27 mg/dL — ABNORMAL HIGH (ref 6–20)
CO2: 26 mmol/L (ref 22–32)
Calcium: 9.2 mg/dL (ref 8.9–10.3)
Chloride: 105 mmol/L (ref 101–111)
Creatinine, Ser: 1.05 mg/dL — ABNORMAL HIGH (ref 0.44–1.00)
GFR, EST AFRICAN AMERICAN: 57 mL/min — AB (ref >60)
GFR, EST NON AFRICAN AMERICAN: 49 mL/min — AB (ref >60)
GLUCOSE: 160 mg/dL — AB (ref 65–99)
Potassium: 4.2 mmol/L (ref 3.5–5.1)
Sodium: 141 mmol/L (ref 135–145)
TOTAL PROTEIN: 7 g/dL (ref 6.5–8.1)

## 2017-02-17 LAB — CBG MONITORING, ED: GLUCOSE-CAPILLARY: 148 mg/dL — AB (ref 65–99)

## 2017-02-17 LAB — I-STAT TROPONIN, ED: TROPONIN I, POC: 0.01 ng/mL (ref 0.00–0.08)

## 2017-02-17 LAB — APTT: aPTT: 35 seconds (ref 24–36)

## 2017-02-17 LAB — PROTIME-INR
INR: 1.3
PROTHROMBIN TIME: 16.3 s — AB (ref 11.4–15.2)

## 2017-02-17 MED ORDER — QUETIAPINE FUMARATE ER 50 MG PO TB24
50.0000 mg | ORAL_TABLET | Freq: Once | ORAL | Status: AC
  Administered 2017-02-17: 50 mg via ORAL
  Filled 2017-02-17 (×2): qty 1

## 2017-02-17 NOTE — ED Notes (Signed)
Report called to [facility], [place].  [facility] called for transport

## 2017-02-17 NOTE — Consult Note (Signed)
Reason for Consult: Code Stroke Referring Physician: ER  [name] is an 82 y.o. female.  HPI:  Lives at [facility] for dementia patients.  Apparently, stopped talking earlier LSN 14:30.  She does have a psychiatric history too. There was no focality.  CT Brain shows global atrophy and periventricular white matter disease.  No bleed.  She is on Eliquis and received last dose this am.    Past Medical History:  Diagnosis Date  . ANXIETY   . Arthritis   . Atrial fibrillation (HCC)    on coumadin  . CARDIOMYOPATHY    Nonischemic; nonobstructive CAD, EF 35-40% by cath 07/2010, EF 25% by echo 07/2010  . CHF   . Coronary artery disease    non obstructive  . DEMENTIA   . Depression   . Diabetes mellitus   . GERD   . HYPERCHOLESTEROLEMIA   . Hypertension   . Myocardial infarction (HCC)   . Pacemaker    Medtronic  . Shortness of breath   . SICK SINUS SYNDROME    s/p Medtronic PPM '04, generator change '12  . Takotsubo cardiomyopathy    2006    Past Surgical History:  Procedure Laterality Date  . BACK SURGERY    . CERVICAL SPINE SURGERY    . CORONARY ANGIOPLASTY    . PACEMAKER INSERTION  2004, 2012   Medtronic   . RADICAL HYSTERECTOMY      Family History  Problem Relation Age of Onset  . Other Unknown        no known CAD    Social History:  reports that she has never smoked. She has never used smokeless tobacco. She reports that she does not drink alcohol or use drugs.  Allergies:  Allergies  Allergen Reactions  . Penicillins Rash    Happened many years ago Has patient had a PCN reaction causing immediate rash, facial/tongue/throat swelling, SOB or lightheadedness with hypotension: Yes Has patient had a PCN reaction causing severe rash involving mucus membranes or skin necrosis: Unknown Has patient had a PCN reaction that required hospitalization Unknown Has patient had a PCN reaction occurring within the last 10 years: No If all of the above answers are "NO", then may  proceed with Cephalosporin use.     Prior to Admission medications   Medication Sig Start Date End Date Taking? Authorizing Provider  acetaminophen (TYLENOL) 500 MG tablet Take 1 tablet (500 mg total) by mouth every 6 (six) hours as needed for mild pain or moderate pain. 06/18/16   Everlene Farrier, PA-C  apixaban (ELIQUIS) 2.5 MG TABS tablet Take 1 tablet (2.5 mg total) by mouth 2 (two) times daily. 09/22/16   Wendall Stade, MD  benazepril (LOTENSIN) 20 MG tablet TAKE 1/2 TABLET BY MOUTH DAILY 01/06/17   Wendall Stade, MD  escitalopram (LEXAPRO) 10 MG tablet Take 10 mg by mouth daily.    [provider]  furosemide (LASIX) 40 MG tablet TAKE 1/2 TABLET BY MOUTH IN THE MORNING AND 1 IN THE EVENING 01/12/16   Marinus Maw, MD  glipiZIDE (GLUCOTROL) 5 MG tablet Take 5 mg by mouth 2 (two) times daily before a meal.  12/19/13   [provider]  memantine (NAMENDA) 10 MG tablet Take 10 mg by mouth 2 (two) times daily.     [provider]  metFORMIN (GLUCOPHAGE) 500 MG tablet Take 500 mg by mouth 2 (two) times daily with a meal.  12/19/13   [provider]  metoprolol  succinate (TOPROL-XL) 50 MG 24 hr tablet TAKE 1 TABLET BY MOUTH ONCE DAILY TAKE WITH OR IMMMEDIATELY FOLLOWINGA MEAL 01/06/17   Wendall Stade, MD  nitroGLYCERIN (NITROSTAT) 0.4 MG SL tablet Place 0.4 mg under the tongue every 5 (five) minutes as needed. For chest pain    [provider]  spironolactone (ALDACTONE) 25 MG tablet Take 0.5 tablets (12.5 mg total) by mouth daily. 01/02/17   Wendall Stade, MD    Medications: Prior to Admission:  (Not in a hospital admission)  Results for orders placed or performed during the hospital encounter of 02/17/17 (from the past 48 hour(s))  Protime-INR     Status: Abnormal   Collection Time: 02/17/17  3:31 PM  Result Value Ref Range   Prothrombin Time 16.3 (H) 11.4 - 15.2 seconds   INR 1.30   APTT     Status: None   Collection Time: 02/17/17  3:31  PM  Result Value Ref Range   aPTT 35 24 - 36 seconds  CBC     Status: Abnormal   Collection Time: 02/17/17  3:31 PM  Result Value Ref Range   WBC 8.8 4.0 - 10.5 K/uL   RBC 3.64 (L) 3.87 - 5.11 MIL/uL   Hemoglobin 11.0 (L) 12.0 - 15.0 g/dL   HCT 16.1 (L) 09.6 - 04.5 %   MCV 94.0 78.0 - 100.0 fL   MCH 30.2 26.0 - 34.0 pg   MCHC 32.2 30.0 - 36.0 g/dL   RDW 40.9 81.1 - 91.4 %   Platelets 328 150 - 400 K/uL  Differential     Status: None   Collection Time: 02/17/17  3:31 PM  Result Value Ref Range   Neutrophils Relative % 69 %   Neutro Abs 6.1 1.7 - 7.7 K/uL   Lymphocytes Relative 19 %   Lymphs Abs 1.7 0.7 - 4.0 K/uL   Monocytes Relative 9 %   Monocytes Absolute 0.8 0.1 - 1.0 K/uL   Eosinophils Relative 2 %   Eosinophils Absolute 0.1 0.0 - 0.7 K/uL   Basophils Relative 1 %   Basophils Absolute 0.0 0.0 - 0.1 K/uL  CBG monitoring, ED     Status: Abnormal   Collection Time: 02/17/17  3:37 PM  Result Value Ref Range   Glucose-Capillary 148 (H) 65 - 99 mg/dL   Comment 1 Notify RN    Comment 2 Document in Chart   I-stat troponin, ED     Status: None   Collection Time: 02/17/17  3:41 PM  Result Value Ref Range   Troponin i, poc 0.01 0.00 - 0.08 ng/mL   Comment 3            Comment: Due to the release kinetics of cTnI, a negative result within the first hours of the onset of symptoms does not rule out myocardial infarction with certainty. If myocardial infarction is still suspected, repeat the test at appropriate intervals.   I-Stat Chem 8, ED     Status: Abnormal   Collection Time: 02/17/17  3:43 PM  Result Value Ref Range   Sodium 142 135 - 145 mmol/L   Potassium 4.1 3.5 - 5.1 mmol/L   Chloride 103 101 - 111 mmol/L   BUN 32 (H) 6 - 20 mg/dL   Creatinine, Ser 7.82 (H) 0.44 - 1.00 mg/dL   Glucose, Bld 956 (H) 65 - 99 mg/dL   Calcium, Ion 2.13 (L) 1.15 - 1.40 mmol/L   TCO2 29 0 - 100 mmol/L  Hemoglobin 10.9 (L) 12.0 - 15.0 g/dL   HCT 06.3 (L) 01.6 - 01.0 %    Ct Head  Code Stroke W/o Cm  Result Date: 02/17/2017 CLINICAL DATA:  Code stroke.  Aphasia EXAM: CT HEAD WITHOUT CONTRAST TECHNIQUE: Contiguous axial images were obtained from the base of the skull through the vertex without intravenous contrast. COMPARISON:  CT head 07/02/2015 FINDINGS: Brain: Moderate to advanced atrophy. Ventricular enlargement consistent with atrophy and stable from the prior study. Chronic microvascular ischemic changes in the white matter and internal capsule on the right. Negative for acute infarct, hemorrhage, mass Vascular: Arterial calcification.  Negative for hyperdense vessel. Skull: Negative Sinuses/Orbits: Negative Other: None ASPECTS (Alberta Stroke Program Early CT Score) - Ganglionic level infarction (caudate, lentiform nuclei, internal capsule, insula, M1-M3 cortex): 7 - Supraganglionic infarction (M4-M6 cortex): 3 Total score (0-10 with 10 being normal): 10 IMPRESSION: 1. No acute intracranial abnormality 2. ASPECTS is 10 These results were called by telephone at the time of interpretation on 02/17/2017 at 4:01 pm to Dr. Nicholas Lose, who verbally acknowledged these results. Electronically Signed   By: Marlan Palau M.D.   On: 02/17/2017 16:02    ROS Blood pressure (!) 131/37, pulse 75, resp. rate 18, SpO2 100 %. Neurologic Examination:  Awake, alert.  Initially mute, but when pressed or pinched she talks in full sentences.  Comprehension and Naming are intact.   She has some disinhibited inappropriate affection.    Face symmetrical. Tongue midline.  EOMI.  Strength 5/5 BUE and BLE.  No babinski.  No hoffman's.   Assessment/Plan:  This presentation is not consistent with stroke.  It appears to be progression of dementia and/or psychiatric based.  In addition, she has taken Eliquis and IV tPA is contraindicated.    Recommended discharge back to original location from my point of view.   Weston Settle, MD 02/17/2017, 4:14 PM

## 2017-02-17 NOTE — ED Triage Notes (Signed)
Pt here from [facility] as a code stroke , lsn 1430 only deficit is aphasic , VSS , pt non verbal on arrival

## 2017-02-17 NOTE — ED Provider Notes (Signed)
[facility]-EMERGENCY DEPT Provider Note   CSN: [id number] Arrival date & time: 02/17/17  1529     History   Chief Complaint Chief Complaint  Patient presents with  . Code Stroke    HPI [name] is a 82 y.o. female.  Patient arrives with EMS as a code stroke for speech changes and confusion that start at 2 pm.    The history is provided by the EMS personnel.  Altered Mental Status   This is a new problem. The current episode started 1 to 2 hours ago. The problem has not changed since onset.Associated symptoms include confusion and unresponsiveness (unable to talk since 2 pm). Her past medical history is significant for diabetes, hypertension and heart disease. Past medical history comments: dementia, afib on eloquis .    Past Medical History:  Diagnosis Date  . ANXIETY   . Arthritis   . Atrial fibrillation (HCC)    on coumadin  . CARDIOMYOPATHY    Nonischemic; nonobstructive CAD, EF 35-40% by cath 07/2010, EF 25% by echo 07/2010  . CHF   . Coronary artery disease    non obstructive  . DEMENTIA   . Depression   . Diabetes mellitus   . GERD   . HYPERCHOLESTEROLEMIA   . Hypertension   . Myocardial infarction (HCC)   . Pacemaker    Medtronic  . Shortness of breath   . SICK SINUS SYNDROME    s/p Medtronic PPM '04, generator change '12  . Takotsubo cardiomyopathy    2006    Patient Active Problem List   Diagnosis Date Noted  . Chest pain 02/01/2012  . Hypertension 02/01/2012  . Type 2 diabetes mellitus (HCC) 02/01/2012  . Pacemaker 02/02/2011  . Long term (current) use of anticoagulants 01/11/2011  . HYPERCHOLESTEROLEMIA 01/28/2009  . ANXIETY 01/28/2009  . MYOCARDIAL INFARCTION 01/28/2009  . Coronary atherosclerosis 01/28/2009  . CARDIOMYOPATHY 01/28/2009  . PAROXYSMAL ATRIAL FIBRILLATION 01/28/2009  . SICK SINUS SYNDROME 01/28/2009  . BRADYCARDIA 01/28/2009  . CHF 01/28/2009  . GERD 01/28/2009  . OSTEOARTHRITIS 01/28/2009  . EDEMA 01/28/2009  .  SHORTNESS OF BREATH 01/28/2009    Past Surgical History:  Procedure Laterality Date  . BACK SURGERY    . CERVICAL SPINE SURGERY    . CORONARY ANGIOPLASTY    . PACEMAKER INSERTION  2004, 2012   Medtronic   . RADICAL HYSTERECTOMY      OB History    No data available       Home Medications    Prior to Admission medications   Medication Sig Start Date End Date Taking? Authorizing Provider  acetaminophen (TYLENOL) 500 MG tablet Take 1 tablet (500 mg total) by mouth every 6 (six) hours as needed for mild pain or moderate pain. 06/18/16   Everlene Farrier, PA-C  apixaban (ELIQUIS) 2.5 MG TABS tablet Take 1 tablet (2.5 mg total) by mouth 2 (two) times daily. 09/22/16   Wendall Stade, MD  benazepril (LOTENSIN) 20 MG tablet TAKE 1/2 TABLET BY MOUTH DAILY 01/06/17   Wendall Stade, MD  escitalopram (LEXAPRO) 10 MG tablet Take 10 mg by mouth daily.    [provider]  furosemide (LASIX) 40 MG tablet TAKE 1/2 TABLET BY MOUTH IN THE MORNING AND 1 IN THE EVENING 01/12/16   Marinus Maw, MD  glipiZIDE (GLUCOTROL) 5 MG tablet Take 5 mg by mouth 2 (two) times daily before a meal.  12/19/13   [provider]  memantine (NAMENDA) 10 MG tablet  Take 10 mg by mouth 2 (two) times daily.     [provider]  metFORMIN (GLUCOPHAGE) 500 MG tablet Take 500 mg by mouth 2 (two) times daily with a meal.  12/19/13   [provider]  metoprolol succinate (TOPROL-XL) 50 MG 24 hr tablet TAKE 1 TABLET BY MOUTH ONCE DAILY TAKE WITH OR IMMMEDIATELY FOLLOWINGA MEAL 01/06/17   Wendall Stade, MD  nitroGLYCERIN (NITROSTAT) 0.4 MG SL tablet Place 0.4 mg under the tongue every 5 (five) minutes as needed. For chest pain    [provider]  spironolactone (ALDACTONE) 25 MG tablet Take 0.5 tablets (12.5 mg total) by mouth daily. 01/02/17   Wendall Stade, MD    Family History Family History  Problem Relation Age of Onset  . Other Unknown        no known CAD    Social  History Social History  Substance Use Topics  . Smoking status: Never Smoker  . Smokeless tobacco: Never Used  . Alcohol use No     Allergies   Penicillins   Review of Systems Review of Systems  Unable to perform ROS: Acuity of condition  Psychiatric/Behavioral: Positive for confusion.     Physical Exam Updated Vital Signs  ED Triage Vitals  Enc Vitals Group     BP 02/17/17 1609 (!) 131/37     Pulse Rate 02/17/17 1609 75     Resp 02/17/17 1609 18     Temp 02/17/17 1642 97.5 F (36.4 C)     Temp Source 02/17/17 1642 Oral     SpO2 02/17/17 1609 100 %     Weight --      Height --      Head Circumference --      Peak Flow --      Pain Score --      Pain Loc --      Pain Edu? --      Excl. in GC? --     Physical Exam  Constitutional: She appears well-developed and well-nourished. No distress.  HENT:  Head: Normocephalic and atraumatic.  Eyes: Conjunctivae and EOM are normal. Pupils are equal, round, and reactive to light.  Neck: Normal range of motion. Neck supple.  Cardiovascular: Normal rate, regular rhythm, normal heart sounds and intact distal pulses.   No murmur heard. Pulmonary/Chest: Effort normal and breath sounds normal. No respiratory distress.  Abdominal: Soft. There is no tenderness.  Musculoskeletal: She exhibits no edema.  Neurological: She is alert. No cranial nerve deficit or sensory deficit. She exhibits normal muscle tone. Coordination normal.  pleasantly demented, 5+/5 strength, normal sensation, no drift  Skin: Skin is warm and dry. Capillary refill takes less than 2 seconds.  Psychiatric: She has a normal mood and affect.  Nursing note and vitals reviewed.    ED Treatments / Results  Labs (all labs ordered are listed, but only abnormal results are displayed) Labs Reviewed  PROTIME-INR - Abnormal; Notable for the following:       Result Value   Prothrombin Time 16.3 (*)    All other components within normal limits  CBC - Abnormal;  Notable for the following:    RBC 3.64 (*)    Hemoglobin 11.0 (*)    HCT 34.2 (*)    All other components within normal limits  COMPREHENSIVE METABOLIC PANEL - Abnormal; Notable for the following:    Glucose, Bld 160 (*)    BUN 27 (*)    Creatinine, Ser 1.05 (*)  ALT 11 (*)    GFR calc non Af Amer 49 (*)    GFR calc Af Amer 57 (*)    All other components within normal limits  URINALYSIS, ROUTINE W REFLEX MICROSCOPIC - Abnormal; Notable for the following:    Leukocytes, UA MODERATE (*)    Bacteria, UA FEW (*)    Squamous Epithelial / LPF 0-5 (*)    All other components within normal limits  CBG MONITORING, ED - Abnormal; Notable for the following:    Glucose-Capillary 148 (*)    All other components within normal limits  I-STAT CHEM 8, ED - Abnormal; Notable for the following:    BUN 32 (*)    Creatinine, Ser 1.10 (*)    Glucose, Bld 156 (*)    Calcium, Ion 1.06 (*)    Hemoglobin 10.9 (*)    HCT 32.0 (*)    All other components within normal limits  URINE CULTURE  APTT  DIFFERENTIAL  I-STAT TROPOININ, ED    EKG  EKG Interpretation  Date/Time:  Friday Feb 17 2017 16:06:10 EDT Ventricular Rate:  62 PR Interval:    QRS Duration: 165 QT Interval:  488 QTC Calculation: 496 R Axis:   -80 Text Interpretation:  VENTRICULAR PACED RHYTHM Prolonged PR interval IVCD, consider atypical RBBB LVH with secondary repolarization abnormality Anterior infarct, old Lateral leads are also involved Confirmed by Anitra Lauth  MD, WHITNEY ([id number]) on 02/17/2017 4:46:40 PM       Radiology Ct Head Code Stroke W/o Cm  Result Date: 02/17/2017 CLINICAL DATA:  Code stroke.  Aphasia EXAM: CT HEAD WITHOUT CONTRAST TECHNIQUE: Contiguous axial images were obtained from the base of the skull through the vertex without intravenous contrast. COMPARISON:  CT head 07/02/2015 FINDINGS: Brain: Moderate to advanced atrophy. Ventricular enlargement consistent with atrophy and stable from the prior study. Chronic  microvascular ischemic changes in the white matter and internal capsule on the right. Negative for acute infarct, hemorrhage, mass Vascular: Arterial calcification.  Negative for hyperdense vessel. Skull: Negative Sinuses/Orbits: Negative Other: None ASPECTS (Alberta Stroke Program Early CT Score) - Ganglionic level infarction (caudate, lentiform nuclei, internal capsule, insula, M1-M3 cortex): 7 - Supraganglionic infarction (M4-M6 cortex): 3 Total score (0-10 with 10 being normal): 10 IMPRESSION: 1. No acute intracranial abnormality 2. ASPECTS is 10 These results were called by telephone at the time of interpretation on 02/17/2017 at 4:01 pm to Dr. Nicholas Lose, who verbally acknowledged these results. Electronically Signed   By: Marlan Palau M.D.   On: 02/17/2017 16:02    Procedures Procedures (including critical care time)  Medications Ordered in ED Medications  QUEtiapine (SEROQUEL XR) 24 hr tablet 50 mg (50 mg Oral Given 02/17/17 1942)     Initial Impression / Assessment and Plan / ED Course  I have reviewed the triage vital signs and the nursing notes.  Pertinent labs & imaging results that were available during my care of the patient were reviewed by me and considered in my medical decision making (see chart for details).     CERA PFOST is an 82 year old female with history of dementia, high cholesterol, atrial fibrillation on eloquis who presents to the ED with altered mental status and was made a code stroke upon arrival. Patient with aphasia that started just prior to arrival at nursing home. Patient taken directly to the CT scanner upon arrival as patient had airway intact. Patient had head CT scan that was done and was unremarkable. Patient had symptoms resolve after neurologist was  assessing for pain and patient started talking. According to family members patient has been upset about being recently transferred to nursing home. She has been anxious and agitated for the last 3 days  while being there. Possible patient was just refusing to speak. Patient has normal vitals upon arrival. Overall patient is well appearing upon my evaluation. Patient is without any neurological deficits. Patient denies chest pain, shortness of breath, abdominal pain. Family members state that patient has had a difficult adjustment. Patient had labs and EKG and urinalysis ordered. Patient had an EKG performed that showed ventricular paced rhythm and pacemaker was interrogated and showed no abnormalities.  Patient had negative nitrates and moderate leukocytes in urine but likely contaminated urine culture sent for. Patient otherwise unremarkable labs. Patient likely with event secondary to dementia. Family given reassurance and patient given home Seroquel dose prior to transfer back to nursing home. Neurology did not believe patient needed further stroke workup and patient not complaining of any pain and discharged in good condition.  Final Clinical Impressions(s) / ED Diagnoses   Final diagnoses:  Altered mental status, unspecified altered mental status type    New Prescriptions New Prescriptions   No medications on file     Virgina Norfolk, DO 02/17/17 2114    Gwyneth Sprout, MD 02/18/17 [phone]

## 2017-02-17 NOTE — Telephone Encounter (Signed)
New Message    They put her mother in a nursing facility and they changed her Eliquis.  They put her on 5mg  2x a day is this ok instead of 2.5 mg once a day

## 2017-02-17 NOTE — ED Notes (Signed)
After Ct scan done MD went to talk with pt and she started talking , code stroke cancelled , pt stated that she did not " want to talk to anyone" , family now at bedside

## 2017-02-17 NOTE — ED Notes (Signed)
This RN walking by room and saw pt climbing out front of bed. Escorted pt back to bed, instructed to not get up. Pt hallucinating thinking "they are watching her" TV turned off, lights dimmed. Door left open. Pt resting in bed. Primary RN aware.

## 2017-02-17 NOTE — Telephone Encounter (Signed)
We had her on lower dose because of her dementia , falls and age She is in ER today with ? Stroke so don't know what to tell you about dosing now

## 2017-02-17 NOTE — Telephone Encounter (Signed)
Called patient's daughter back. She stated they would be leaving the ER shortly that patient did not have a stroke. Informed her of Dr. Fabio Bering advisement. Encouraged patient's daughter to discuss with ER doctor. Patient's daughter verbalized understanding.

## 2017-02-17 NOTE — ED Notes (Signed)
[facility] contacted to activate Code Stroke 

## 2017-02-17 NOTE — Telephone Encounter (Signed)
Called patient's daughter (DPR) back about her message. Patient's PCP changed patient from eliquis 2.5 mg to eliquis 5 mg. Family is concerned about change. Informed patient's daughter that patients are usually on a lower dose of eliquis due to their kidney function. Patient's daughter stated that patient had recent lab work with her PCP in March, and as far as she knows everything was normal. Requested patient's lab work to be faxed to our office, so we can put them in patient's chart. Informed patient that message would be sent to Dr. [name] for advisement.

## 2017-02-17 NOTE — ED Notes (Signed)
[facility] contacted to cancel Code Stroke 

## 2017-02-20 LAB — URINE CULTURE: Culture: 30000 — AB

## 2017-02-21 ENCOUNTER — Telehealth: Payer: Self-pay | Admitting: Cardiovascular Disease

## 2017-02-21 ENCOUNTER — Telehealth: Payer: Self-pay | Admitting: Emergency Medicine

## 2017-02-21 ENCOUNTER — Other Ambulatory Visit: Payer: Self-pay | Admitting: Internal Medicine

## 2017-02-21 MED ORDER — APIXABAN 2.5 MG PO TABS: 2.5000 mg | ORAL_TABLET | Freq: Two times a day (BID) | ORAL | 11 refills | Status: DC

## 2017-02-21 MED ORDER — NITROGLYCERIN 0.4 MG SL SUBL: 0.4000 mg | SUBLINGUAL_TABLET | SUBLINGUAL | 2 refills | Status: DC | PRN

## 2017-02-21 NOTE — Telephone Encounter (Signed)
Called daughter back about her message. Patient needs a refill on nitro and have a new prescription on eliquis sent through to the pharmacy, so the patient's nursing home can give patient lower dose of eliquis that Dr. [name] recommends.

## 2017-02-21 NOTE — Telephone Encounter (Signed)
Post ED Visit - Positive Culture Follow-up  Culture report reviewed by antimicrobial stewardship pharmacist:  []  Enzo Bi, Pharm.D. [x]  Celedonio Miyamoto, Pharm.D., BCPS AQ-ID []  Garvin Fila, Pharm.D., BCPS []  Georgina Pillion, Pharm.D., BCPS []  [place], [address].D., BCPS, AAHIVP []  Estella Husk, Pharm.D., BCPS, AAHIVP []  Lysle Pearl, PharmD, BCPS []  Casilda Carls, PharmD, BCPS []  Pollyann Samples, PharmD, BCPS  Positive urine culture Treated with none, asymptomatic,no further patient follow-up is required at this time.  Berle Mull 02/21/2017, 1:15 PM

## 2017-02-21 NOTE — Telephone Encounter (Signed)
Patient daughter [name]) calling in regards to a previous conversation on Friday about patient's medications. Thanks.

## 2017-03-12 ENCOUNTER — Emergency Department (HOSPITAL_COMMUNITY)
Admission: EM | Admit: 2017-03-12 | Discharge: 2017-03-13 | Disposition: A | Discharge status: Home / Self Care | Payer: Medicare Other | Source: Home / Self Care | Attending: Emergency Medicine | Admitting: Emergency Medicine

## 2017-03-12 ENCOUNTER — Emergency Department (HOSPITAL_COMMUNITY): Payer: Medicare Other

## 2017-03-12 ENCOUNTER — Encounter (HOSPITAL_COMMUNITY): Payer: Self-pay | Admitting: Emergency Medicine

## 2017-03-12 ENCOUNTER — Emergency Department (HOSPITAL_COMMUNITY)
Admission: EM | Admit: 2017-03-12 | Discharge: 2017-03-12 | Disposition: A | Discharge status: Skilled Nursing Facility | Payer: Medicare Other | Attending: Emergency Medicine | Admitting: Emergency Medicine

## 2017-03-12 DIAGNOSIS — S22000A Wedge compression fracture of unspecified thoracic vertebra, initial encounter for closed fracture: Secondary | ICD-10-CM

## 2017-03-12 DIAGNOSIS — Z950 Presence of cardiac pacemaker: Secondary | ICD-10-CM | POA: Insufficient documentation

## 2017-03-12 DIAGNOSIS — Y999 Unspecified external cause status: Secondary | ICD-10-CM | POA: Insufficient documentation

## 2017-03-12 DIAGNOSIS — F0390 Unspecified dementia without behavioral disturbance: Secondary | ICD-10-CM | POA: Insufficient documentation

## 2017-03-12 DIAGNOSIS — I2510 Atherosclerotic heart disease of native coronary artery without angina pectoris: Secondary | ICD-10-CM | POA: Diagnosis not present

## 2017-03-12 DIAGNOSIS — Z7984 Long term (current) use of oral hypoglycemic drugs: Secondary | ICD-10-CM | POA: Insufficient documentation

## 2017-03-12 DIAGNOSIS — Z7901 Long term (current) use of anticoagulants: Secondary | ICD-10-CM | POA: Insufficient documentation

## 2017-03-12 DIAGNOSIS — S22089A Unspecified fracture of T11-T12 vertebra, initial encounter for closed fracture: Secondary | ICD-10-CM | POA: Diagnosis not present

## 2017-03-12 DIAGNOSIS — W2201XA Walked into wall, initial encounter: Secondary | ICD-10-CM | POA: Insufficient documentation

## 2017-03-12 DIAGNOSIS — Z79899 Other long term (current) drug therapy: Secondary | ICD-10-CM | POA: Diagnosis not present

## 2017-03-12 DIAGNOSIS — E119 Type 2 diabetes mellitus without complications: Secondary | ICD-10-CM | POA: Insufficient documentation

## 2017-03-12 DIAGNOSIS — Y92129 Unspecified place in nursing home as the place of occurrence of the external cause: Secondary | ICD-10-CM | POA: Diagnosis not present

## 2017-03-12 DIAGNOSIS — I252 Old myocardial infarction: Secondary | ICD-10-CM | POA: Insufficient documentation

## 2017-03-12 DIAGNOSIS — Y939 Activity, unspecified: Secondary | ICD-10-CM | POA: Diagnosis not present

## 2017-03-12 DIAGNOSIS — I110 Hypertensive heart disease with heart failure: Secondary | ICD-10-CM | POA: Insufficient documentation

## 2017-03-12 DIAGNOSIS — I509 Heart failure, unspecified: Secondary | ICD-10-CM | POA: Diagnosis not present

## 2017-03-12 DIAGNOSIS — W19XXXA Unspecified fall, initial encounter: Secondary | ICD-10-CM

## 2017-03-12 DIAGNOSIS — S299XXA Unspecified injury of thorax, initial encounter: Secondary | ICD-10-CM | POA: Diagnosis present

## 2017-03-12 MED ORDER — HYDROCODONE-ACETAMINOPHEN 5-325 MG PO TABS
1.0000 | ORAL_TABLET | Freq: Once | ORAL | Status: AC
  Administered 2017-03-12: 1 via ORAL
  Filled 2017-03-12: qty 1

## 2017-03-12 NOTE — ED Notes (Signed)
ED Provider at bedside. 

## 2017-03-12 NOTE — ED Triage Notes (Signed)
Pt arrived to ED via [facility]. Pt c/o pain in right arm, right leg and hip post fall from recliner at [facility]. Possible left arm pain. Pt is demented and alert to self. Pt has T12 fracture.

## 2017-03-12 NOTE — Discharge Instructions (Addendum)
Return here as needed.  Follow-up with your primary doctor. 1000mg  of Tylenol every 4 hours for pain. I would recommend less ambulation than normal.

## 2017-03-12 NOTE — ED Provider Notes (Signed)
[facility]-EMERGENCY DEPT Provider Note   CSN: [id number] Arrival date & time: 03/12/17  1134     History   Chief Complaint Chief Complaint  Patient presents with  . Fall    HPI [name] is a 82 y.o. female.  HPI Patient presents to the emergency department after a fall at a nursing home.  The patient had a witnessed fall by family and one staff member where she stumbled back against a wall and slid down the wall.  The patient did not lose consciousness.  The patient has dementia and is unable to give me any history.  Past Medical History:  Diagnosis Date  . ANXIETY   . Arthritis   . Atrial fibrillation (HCC)    on coumadin  . CARDIOMYOPATHY    Nonischemic; nonobstructive CAD, EF 35-40% by cath 07/2010, EF 25% by echo 07/2010  . CHF   . Coronary artery disease    non obstructive  . DEMENTIA   . Depression   . Diabetes mellitus   . GERD   . HYPERCHOLESTEROLEMIA   . Hypertension   . Myocardial infarction (HCC)   . Pacemaker    Medtronic  . Shortness of breath   . SICK SINUS SYNDROME    s/p Medtronic PPM '04, generator change '12  . Takotsubo cardiomyopathy    2006    Patient Active Problem List   Diagnosis Date Noted  . Chest pain 02/01/2012  . Hypertension 02/01/2012  . Type 2 diabetes mellitus (HCC) 02/01/2012  . Pacemaker 02/02/2011  . Long term (current) use of anticoagulants 01/11/2011  . HYPERCHOLESTEROLEMIA 01/28/2009  . ANXIETY 01/28/2009  . MYOCARDIAL INFARCTION 01/28/2009  . Coronary atherosclerosis 01/28/2009  . CARDIOMYOPATHY 01/28/2009  . PAROXYSMAL ATRIAL FIBRILLATION 01/28/2009  . SICK SINUS SYNDROME 01/28/2009  . BRADYCARDIA 01/28/2009  . CHF 01/28/2009  . GERD 01/28/2009  . OSTEOARTHRITIS 01/28/2009  . EDEMA 01/28/2009  . SHORTNESS OF BREATH 01/28/2009    Past Surgical History:  Procedure Laterality Date  . BACK SURGERY    . CERVICAL SPINE SURGERY    . CORONARY ANGIOPLASTY    . PACEMAKER INSERTION  2004, 2012   Medtronic   .  RADICAL HYSTERECTOMY      OB History    No data available       Home Medications    Prior to Admission medications   Medication Sig Start Date End Date Taking? Authorizing Provider  acetaminophen (TYLENOL) 500 MG tablet Take 1 tablet (500 mg total) by mouth every 6 (six) hours as needed for mild pain or moderate pain. 06/18/16   Everlene Farrier, PA-C  apixaban (ELIQUIS) 2.5 MG TABS tablet Take 1 tablet (2.5 mg total) by mouth 2 (two) times daily. 02/21/17   Wendall Stade, MD  benazepril (LOTENSIN) 20 MG tablet TAKE 1/2 TABLET BY MOUTH DAILY 01/06/17   Wendall Stade, MD  escitalopram (LEXAPRO) 10 MG tablet Take 10 mg by mouth daily.    [provider]  furosemide (LASIX) 40 MG tablet TAKE 1/2 TABLET BY MOUTH IN THE MORNING AND 1 IN THE EVENING 02/21/17   Marinus Maw, MD  glipiZIDE (GLUCOTROL) 5 MG tablet Take 5 mg by mouth 2 (two) times daily before a meal.  12/19/13   [provider]  memantine (NAMENDA) 10 MG tablet Take 10 mg by mouth 2 (two) times daily.     [provider]  metFORMIN (GLUCOPHAGE) 500 MG tablet Take 500 mg by mouth 2 (two) times daily with  a meal.  12/19/13   [provider]  metoprolol succinate (TOPROL-XL) 50 MG 24 hr tablet TAKE 1 TABLET BY MOUTH ONCE DAILY TAKE WITH OR IMMMEDIATELY FOLLOWINGA MEAL 01/06/17   Wendall Stade, MD  nitroGLYCERIN (NITROSTAT) 0.4 MG SL tablet Place 1 tablet (0.4 mg total) under the tongue every 5 (five) minutes as needed. For chest pain 02/21/17   Wendall Stade, MD  spironolactone (ALDACTONE) 25 MG tablet Take 0.5 tablets (12.5 mg total) by mouth daily. 01/02/17   Wendall Stade, MD    Family History Family History  Problem Relation Age of Onset  . Other Unknown        no known CAD    Social History Social History  Substance Use Topics  . Smoking status: Never Smoker  . Smokeless tobacco: Never Used  . Alcohol use No     Allergies   Penicillins   Review of Systems Review of  Systems Level V caveat applies due to dementia  Physical Exam Updated Vital Signs BP (!) 134/49 (BP Location: Left Arm)   Pulse 62   Temp 98.2 F (36.8 C) (Oral)   Resp 16   SpO2 100%   Physical Exam  Constitutional: She appears well-developed and well-nourished. No distress.  HENT:  Head: Normocephalic and atraumatic.  Eyes: Pupils are equal, round, and reactive to light.  Cardiovascular: Normal rate and regular rhythm.   Pulmonary/Chest: Effort normal and breath sounds normal. No respiratory distress.  Neurological: She is alert.  Skin: Skin is warm and dry.  Psychiatric: She has a normal mood and affect.  Nursing note and vitals reviewed.    ED Treatments / Results  Labs (all labs ordered are listed, but only abnormal results are displayed) Labs Reviewed - No data to display  EKG  EKG Interpretation None       Radiology Ct Head Wo Contrast  Result Date: 03/12/2017 CLINICAL DATA:  Patient status post fall. No reported loss of consciousness. Lower back pain. EXAM: CT HEAD WITHOUT CONTRAST TECHNIQUE: Contiguous axial images were obtained from the base of the skull through the vertex without intravenous contrast. COMPARISON:  Brain CT 02/17/2017 FINDINGS: Brain: Ventricles and sulci are prominent compatible with atrophy. Periventricular and subcortical white matter hypodensity compatible with chronic microvascular ischemic changes. Old right internal capsule infarct. No evidence for acute cortically based infarct, intracranial hemorrhage, mass lesion or mass-effect. Vascular: Internal carotid arterial vascular calcifications. Skull: Intact. Sinuses/Orbits: Paranasal sinuses are well aerated. Mastoid air cells are unremarkable. Orbits are unremarkable. Other: None. IMPRESSION: No acute intracranial process. Atrophy and chronic microvascular ischemic changes. Electronically Signed   By: Annia Belt M.D.   On: 03/12/2017 13:25    Procedures Procedures (including critical care  time)  Medications Ordered in ED Medications - No data to display   Initial Impression / Assessment and Plan / ED Course  I have reviewed the triage vital signs and the nursing notes.  Pertinent labs & imaging results that were available during my care of the patient were reviewed by me and considered in my medical decision making (see chart for details).     Patient be discharged back to the nursing facility.  CT scan does not show any acute intracranial abnormality.  We will have the patient follow up with her primary doctor.  Patient does have a T12 compression fracture.  Did not give him follow-up with Dr. Earma Reading for potential kyphoplasty if it was deemed necessary.  I want her to have close  follow-up with her primary doctor as well.  Not sure that the patient would do well in a TLSO brace at this point  Final Clinical Impressions(s) / ED Diagnoses   Final diagnoses:  None    New Prescriptions New Prescriptions   No medications on file     Charlestine Night, PA-C 03/12/17 1337    Charlestine Night, PA-C 03/12/17 1620    Loren Racer, MD 03/16/17 1450

## 2017-03-12 NOTE — ED Triage Notes (Signed)
Pt arrives with [facility] from nursing home with a witnessed fall where she fell onto her back. No LoC. Pt c/o of low back pain.

## 2017-03-12 NOTE — ED Provider Notes (Signed)
[facility] DEPT Provider Note   CSN: [id number] Arrival date & time: 03/12/17  2231     History   Chief Complaint Chief Complaint  Patient presents with  . Fall    HPI Railyn House Krzyzanowski is a 82 y.o. female is a level V Cave[place] with history of Alzheimer's dementia who presents following second fall today. Patient was evaluated and diagnosed with T12 compression fracture earlier today and went care Link arrived back patient was found face down from a recliner. She had an unwitnessed fall. She is complaining of right arm, right hip, and right leg pain. Patient is on a liquids for paroxysmal atrial fibrillation.  HPI  Past Medical History:  Diagnosis Date  . ANXIETY   . Arthritis   . Atrial fibrillation (HCC)    on coumadin  . CARDIOMYOPATHY    Nonischemic; nonobstructive CAD, EF 35-40% by cath 07/2010, EF 25% by echo 07/2010  . CHF   . Coronary artery disease    non obstructive  . DEMENTIA   . Depression   . Diabetes mellitus   . GERD   . HYPERCHOLESTEROLEMIA   . Hypertension   . Myocardial infarction (HCC)   . Pacemaker    Medtronic  . Shortness of breath   . SICK SINUS SYNDROME    s/p Medtronic PPM '04, generator change '12  . Takotsubo cardiomyopathy    2006    Patient Active Problem List   Diagnosis Date Noted  . Chest pain 02/01/2012  . Hypertension 02/01/2012  . Type 2 diabetes mellitus (HCC) 02/01/2012  . Pacemaker 02/02/2011  . Long term (current) use of anticoagulants 01/11/2011  . HYPERCHOLESTEROLEMIA 01/28/2009  . ANXIETY 01/28/2009  . MYOCARDIAL INFARCTION 01/28/2009  . Coronary atherosclerosis 01/28/2009  . CARDIOMYOPATHY 01/28/2009  . PAROXYSMAL ATRIAL FIBRILLATION 01/28/2009  . SICK SINUS SYNDROME 01/28/2009  . BRADYCARDIA 01/28/2009  . CHF 01/28/2009  . GERD 01/28/2009  . OSTEOARTHRITIS 01/28/2009  . EDEMA 01/28/2009  . SHORTNESS OF BREATH 01/28/2009    Past Surgical History:  Procedure Laterality Date  . BACK SURGERY    . CERVICAL  SPINE SURGERY    . CORONARY ANGIOPLASTY    . PACEMAKER INSERTION  2004, 2012   Medtronic   . RADICAL HYSTERECTOMY      OB History    No data available       Home Medications    Prior to Admission medications   Medication Sig Start Date End Date Taking? Authorizing Provider  ALPRAZolam Prudy Feeler) 0.5 MG tablet Take 0.5 mg by mouth 2 (two) times daily.   Yes [provider]  alum & mag hydroxide-simeth (MINTOX) 200-200-20 MG/5ML suspension Take 30 mLs by mouth every 6 (six) hours as needed for indigestion or heartburn.   Yes [provider]  apixaban (ELIQUIS) 2.5 MG TABS tablet Take 1 tablet (2.5 mg total) by mouth 2 (two) times daily. 02/21/17  Yes Wendall Stade, MD  benazepril (LOTENSIN) 20 MG tablet Take 20 mg by mouth daily.   Yes [provider]  divalproex (DEPAKOTE SPRINKLE) 125 MG capsule Take 250 mg by mouth 2 (two) times daily.   Yes [provider]  escitalopram (LEXAPRO) 10 MG tablet Take 10 mg by mouth daily.   Yes [provider]  furosemide (LASIX) 40 MG tablet Take 20 mg by mouth 2 (two) times daily.   Yes [provider]  glipiZIDE (GLUCOTROL) 5 MG tablet Take 5 mg by mouth 2 (two) times daily before a meal.  12/19/13  Yes [provider]  guaifenesin (ROBITUSSIN) 100 MG/5ML syrup Take 200 mg by mouth 4 (four) times daily as needed for cough.   Yes [provider]  loperamide (IMODIUM) 2 MG capsule Take 2 mg by mouth as needed for diarrhea or loose stools.   Yes [provider]  magnesium hydroxide (MILK OF MAGNESIA) 400 MG/5ML suspension Take 30 mLs by mouth at bedtime as needed for mild constipation.   Yes [provider]  metFORMIN (GLUCOPHAGE) 500 MG tablet Take 500 mg by mouth 2 (two) times daily with a meal.  12/19/13  Yes [provider]  metoprolol succinate (TOPROL-XL) 50 MG 24 hr tablet TAKE 1 TABLET BY MOUTH ONCE DAILY TAKE WITH OR IMMMEDIATELY FOLLOWINGA MEAL 01/06/17   Yes Wendall Stade, MD  neomycin-bacitracin-polymyxin (NEOSPORIN) ointment Apply 1 application topically as needed for wound care. apply to eye   Yes [provider]  nitroGLYCERIN (NITROSTAT) 0.4 MG SL tablet Place 1 tablet (0.4 mg total) under the tongue every 5 (five) minutes as needed. For chest pain 02/21/17  Yes Wendall Stade, MD  nystatin (NYSTATIN) powder Apply topically 2 (two) times daily. For 14 days   Yes [provider]  QUEtiapine (SEROQUEL) 25 MG tablet Take 25 mg by mouth 2 (two) times daily.   Yes [provider]  spironolactone (ALDACTONE) 25 MG tablet Take 0.5 tablets (12.5 mg total) by mouth daily. 01/02/17  Yes Wendall Stade, MD  acetaminophen (TYLENOL) 500 MG tablet Take 1 tablet (500 mg total) by mouth every 6 (six) hours as needed for mild pain or moderate pain. Patient not taking: Reported on 03/12/2017 06/18/16   Everlene Farrier, PA-C  benazepril (LOTENSIN) 20 MG tablet TAKE 1/2 TABLET BY MOUTH DAILY Patient not taking: Reported on 03/12/2017 01/06/17   Wendall Stade, MD  furosemide (LASIX) 40 MG tablet TAKE 1/2 TABLET BY MOUTH IN THE MORNING AND 1 IN THE EVENING Patient not taking: Reported on 03/12/2017 02/21/17   Marinus Maw, MD    Family History Family History  Problem Relation Age of Onset  . Other Unknown        no known CAD    Social History Social History  Substance Use Topics  . Smoking status: Never Smoker  . Smokeless tobacco: Never Used  . Alcohol use No     Allergies   Penicillins   Review of Systems Review of Systems  Unable to perform ROS: Dementia     Physical Exam Updated Vital Signs BP (!) 120/95   Pulse 71   Temp 98.2 F (36.8 C) (Oral)   Resp 18   SpO2 98%   Physical Exam  Constitutional: She appears well-developed and well-nourished. No distress.  HENT:  Head: Normocephalic and atraumatic.  Mouth/Throat: Oropharynx is clear and moist. No oropharyngeal exudate.  Eyes: Conjunctivae are  normal. Pupils are equal, round, and reactive to light. Right eye exhibits no discharge. Left eye exhibits no discharge. No scleral icterus.  Neck: Normal range of motion. Neck supple.  No midline tenderness  Cardiovascular: Normal rate, regular rhythm, normal heart sounds and intact distal pulses.  Exam reveals no gallop and no friction rub.   No murmur heard. Pulmonary/Chest: Effort normal and breath sounds normal. No stridor. No respiratory distress. She has no wheezes. She has no rales.  Abdominal: Soft. Bowel sounds are normal. She exhibits no distension. There is no tenderness. There is no rebound and no guarding.  Musculoskeletal: She exhibits no  edema.       Right shoulder: She exhibits bony tenderness.       Right elbow: Normal.      Right wrist: Normal.       Right hip: She exhibits bony tenderness.       Arms:      Right lower leg: She exhibits bony tenderness.       Legs: Lumbar midline tenderness, No thoracic tenderness Right anterior and lateral hip tenderness, right femur tenderness, right tib-fib tenderness, right shoulder tenderness No left arm tenderness to palpation  Lymphadenopathy:    She has no cervical adenopathy.  Neurological: She is alert. Coordination normal.  Oriented to self, which is baseline Sensation intact, 5/5 strength to all 4 extremities with equal bilateral grip strength  Skin: Skin is warm and dry. No rash noted. She is not diaphoretic. No pallor.  Psychiatric: She has a normal mood and affect.  Nursing note and vitals reviewed.    ED Treatments / Results  Labs (all labs ordered are listed, but only abnormal results are displayed) Labs Reviewed  BASIC METABOLIC PANEL - Abnormal; Notable for the following:       Result Value   Chloride 99 (*)    Glucose, Bld 129 (*)    BUN 27 (*)    Creatinine, Ser 1.05 (*)    GFR calc non Af Amer 49 (*)    GFR calc Af Amer 57 (*)    All other components within normal limits  URINALYSIS, ROUTINE W REFLEX  MICROSCOPIC - Abnormal; Notable for the following:    Color, Urine STRAW (*)    Leukocytes, UA TRACE (*)    Bacteria, UA RARE (*)    Squamous Epithelial / LPF 0-5 (*)    All other components within normal limits    EKG  EKG Interpretation None       Radiology Dg Lumbar Spine Complete  Result Date: 03/13/2017 CLINICAL DATA:  Patient fell this evening complaining of lower back pain. EXAM: LUMBAR SPINE - COMPLETE 4+ VIEW COMPARISON:  Lumbar radiographs 1518 hours on the same day. Lateral chest radiograph from 07/02/2015. FINDINGS: Once again there is an anterior superior T12 compression fracture with approximately 50% height loss anteriorly without definite bony retropulsion. No subluxation. No other fractures are identified. There is normal lumbar segmentation. There is aortic atherosclerosis without aneurysm. IMPRESSION: Again noted is in the anterior superior T12 compression fracture with 50% anterior height loss, unchanged since earlier same day exam and new since 2016 comparison chest radiograph. Electronically Signed   By: Tollie Eth M.D.   On: 03/13/2017 00:35   Dg Lumbar Spine Complete  Result Date: 03/12/2017 CLINICAL DATA:  Acute low back pain following fall. Initial encounter. EXAM: LUMBAR SPINE - COMPLETE 4+ VIEW COMPARISON:  09/23/2007 abdominal CT FINDINGS: A 50% compression fracture of the T12 superior endplate is noted. No definite bony retropulsion noted. No evidence of subluxation. No other fractures are identified. No focal bony lesions or spondylolysis noted. Abdominal aortic atherosclerotic calcifications are present. IMPRESSION: 50% T12 superior endplate compression fracture. No definite bony retropulsion. Abdominal aortic atherosclerosis. Electronically Signed   By: Harmon Pier M.D.   On: 03/12/2017 15:33   Dg Shoulder Right  Result Date: 03/13/2017 CLINICAL DATA:  Pain after fall EXAM: RIGHT SHOULDER - 2+ VIEW COMPARISON:  None. FINDINGS: There is no evidence of fracture  or dislocation. There is no evidence of arthropathy or other focal bone abnormality. Soft tissues are unremarkable. The included ribs and  lung are unremarkable appearing IMPRESSION: Negative. Electronically Signed   By: Tollie Eth M.D.   On: 03/13/2017 00:37   Dg Tibia/fibula Right  Result Date: 03/13/2017 CLINICAL DATA:  Patient fell tonight.  Pain. EXAM: RIGHT TIBIA AND FIBULA - 2 VIEW COMPARISON:  None. FINDINGS: There is no evidence of fracture or other focal bone lesions. Soft tissues are unremarkable. Vascular calcifications are identified along the course of the tibial artery IMPRESSION: Negative for acute fracture or dislocation. Electronically Signed   By: Tollie Eth M.D.   On: 03/13/2017 00:39   Ct Head Wo Contrast  Result Date: 03/13/2017 CLINICAL DATA:  Fall EXAM: CT HEAD WITHOUT CONTRAST CT CERVICAL SPINE WITHOUT CONTRAST TECHNIQUE: Multidetector CT imaging of the head and cervical spine was performed following the standard protocol without intravenous contrast. Multiplanar CT image reconstructions of the cervical spine were also generated. COMPARISON:  Head CT 03/12/2017 and 02/17/2017 FINDINGS: CT HEAD FINDINGS Brain: No mass lesion, intraparenchymal hemorrhage or extra-axial collection. No evidence of acute cortical infarct. There is periventricular hypoattenuation compatible with chronic microvascular disease. There is generalized volume loss with prominence of the extra-axial spaces and ventricles. Vascular: Atherosclerotic calcification of the vertebral and internal carotid arteries at the skull base. Skull: Normal visualized skull base, calvarium and extracranial soft tissues. Sinuses/Orbits: No sinus fluid levels or advanced mucosal thickening. No mastoid effusion. Normal orbits. CT CERVICAL SPINE FINDINGS Alignment: Grade 1 anterolisthesis C7-T1, likely secondary to facet arthrosis. No other static subluxation. Facets are aligned. Occipital condyles are normally positioned. Skull base  and vertebrae: No acute fracture. ACDF hardware with solid osseous fusion from C3-C7. For Soft tissues and spinal canal: No prevertebral fluid or swelling. No visible canal hematoma. Disc levels: Multilevel facet hypertrophy, worst at left C2-C3 and C3-C4 and right C7-T1. Upper chest: No pneumothorax, pulmonary nodule or pleural effusion. Other: Normal visualized paraspinal cervical soft tissues. IMPRESSION: 1. No acute abnormality of the brain or cervical spine. 2. Chronic microvascular ischemia and atrophy of the brain. 3. Postfusion findings at C3-C7 with solid osseous fusion mass. Grade 1 C7-T1 anterolisthesis secondary to facet arthrosis. Electronically Signed   By: Deatra Robinson M.D.   On: 03/13/2017 00:10   Ct Head Wo Contrast  Result Date: 03/12/2017 CLINICAL DATA:  Patient status post fall. No reported loss of consciousness. Lower back pain. EXAM: CT HEAD WITHOUT CONTRAST TECHNIQUE: Contiguous axial images were obtained from the base of the skull through the vertex without intravenous contrast. COMPARISON:  Brain CT 02/17/2017 FINDINGS: Brain: Ventricles and sulci are prominent compatible with atrophy. Periventricular and subcortical white matter hypodensity compatible with chronic microvascular ischemic changes. Old right internal capsule infarct. No evidence for acute cortically based infarct, intracranial hemorrhage, mass lesion or mass-effect. Vascular: Internal carotid arterial vascular calcifications. Skull: Intact. Sinuses/Orbits: Paranasal sinuses are well aerated. Mastoid air cells are unremarkable. Orbits are unremarkable. Other: None. IMPRESSION: No acute intracranial process. Atrophy and chronic microvascular ischemic changes. Electronically Signed   By: Annia Belt M.D.   On: 03/12/2017 13:25   Ct Cervical Spine Wo Contrast  Result Date: 03/13/2017 CLINICAL DATA:  Fall EXAM: CT HEAD WITHOUT CONTRAST CT CERVICAL SPINE WITHOUT CONTRAST TECHNIQUE: Multidetector CT imaging of the head and  cervical spine was performed following the standard protocol without intravenous contrast. Multiplanar CT image reconstructions of the cervical spine were also generated. COMPARISON:  Head CT 03/12/2017 and 02/17/2017 FINDINGS: CT HEAD FINDINGS Brain: No mass lesion, intraparenchymal hemorrhage or extra-axial collection. No evidence of acute cortical infarct. There  is periventricular hypoattenuation compatible with chronic microvascular disease. There is generalized volume loss with prominence of the extra-axial spaces and ventricles. Vascular: Atherosclerotic calcification of the vertebral and internal carotid arteries at the skull base. Skull: Normal visualized skull base, calvarium and extracranial soft tissues. Sinuses/Orbits: No sinus fluid levels or advanced mucosal thickening. No mastoid effusion. Normal orbits. CT CERVICAL SPINE FINDINGS Alignment: Grade 1 anterolisthesis C7-T1, likely secondary to facet arthrosis. No other static subluxation. Facets are aligned. Occipital condyles are normally positioned. Skull base and vertebrae: No acute fracture. ACDF hardware with solid osseous fusion from C3-C7. For Soft tissues and spinal canal: No prevertebral fluid or swelling. No visible canal hematoma. Disc levels: Multilevel facet hypertrophy, worst at left C2-C3 and C3-C4 and right C7-T1. Upper chest: No pneumothorax, pulmonary nodule or pleural effusion. Other: Normal visualized paraspinal cervical soft tissues. IMPRESSION: 1. No acute abnormality of the brain or cervical spine. 2. Chronic microvascular ischemia and atrophy of the brain. 3. Postfusion findings at C3-C7 with solid osseous fusion mass. Grade 1 C7-T1 anterolisthesis secondary to facet arthrosis. Electronically Signed   By: Deatra Robinson M.D.   On: 03/13/2017 00:10   Dg Hip Unilat W Or Wo Pelvis 2-3 Views Right  Result Date: 03/13/2017 CLINICAL DATA:  Right for back pain after fall. EXAM: DG HIP (WITH OR WITHOUT PELVIS) 2-3V RIGHT COMPARISON:   None. FINDINGS: There is no evidence of hip fracture or dislocation. The bony pelvis appears intact. There is lower lumbar facet arthropathy. Minimal spurring about the right hip involving the acetabular roof. IMPRESSION: Negative for acute pelvic or hip fracture. Mild lower lumbar facet arthropathy. Minimal degenerative spurring off the acetabular roof on the right. Electronically Signed   By: Tollie Eth M.D.   On: 03/13/2017 00:30   Dg Femur Min 2 Views Right  Result Date: 03/13/2017 CLINICAL DATA:  Pain after fall EXAM: RIGHT FEMUR 2 VIEWS COMPARISON:  07/02/2015 pelvic radiograph and 02/11/2014 pelvic and right hip radiographs FINDINGS: There is no evidence of fracture or other focal bone lesions. Soft tissues are unremarkable. IMPRESSION: Negative. Electronically Signed   By: Tollie Eth M.D.   On: 03/13/2017 00:39    Procedures Procedures (including critical care time)  Medications Ordered in ED Medications - No data to display   Initial Impression / Assessment and Plan / ED Course  I have reviewed the triage vital signs and the nursing notes.  Pertinent labs & imaging results that were available during my care of the patient were reviewed by me and considered in my medical decision making (see chart for details).     BMP shows no significant findings. UA shows trace leukocytes, rare bacteria. Doubt this as cause of fall. No new findings on x-rays and CT head and C-spine. T12 compression fracture remains and is unchanged. Follow-up to orthopedics for further evaluation and treatment. Patient stable to return home to SNF. Patient also evaluated by Dr. Patria Mane who agrees with. Patient was stable throughout ED course and discharged in satisfactory condition.  Final Clinical Impressions(s) / ED Diagnoses   Final diagnoses:  Fall, initial encounter    New Prescriptions New Prescriptions   No medications on file     Verdis Prime 03/13/17 0101    Azalia Bilis,  MD 03/13/17 [phone]

## 2017-03-12 NOTE — ED Notes (Signed)
Report called back to [employer]

## 2017-03-12 NOTE — ED Notes (Signed)
PA at [facility]

## 2017-03-13 LAB — URINALYSIS, ROUTINE W REFLEX MICROSCOPIC
Bilirubin Urine: NEGATIVE
Glucose, UA: NEGATIVE mg/dL
Hgb urine dipstick: NEGATIVE
Ketones, ur: NEGATIVE mg/dL
Nitrite: NEGATIVE
Protein, ur: NEGATIVE mg/dL
Specific Gravity, Urine: 1.011 (ref 1.005–1.030)
pH: 6 (ref 5.0–8.0)

## 2017-03-13 LAB — BASIC METABOLIC PANEL
Anion gap: 12 (ref 5–15)
BUN: 27 mg/dL — ABNORMAL HIGH (ref 6–20)
CO2: 27 mmol/L (ref 22–32)
Calcium: 9.3 mg/dL (ref 8.9–10.3)
Chloride: 99 mmol/L — ABNORMAL LOW (ref 101–111)
Creatinine, Ser: 1.05 mg/dL — ABNORMAL HIGH (ref 0.44–1.00)
GFR calc Af Amer: 57 mL/min — ABNORMAL LOW (ref >60)
GFR calc non Af Amer: 49 mL/min — ABNORMAL LOW (ref >60)
Glucose, Bld: 129 mg/dL — ABNORMAL HIGH (ref 65–99)
Potassium: 4 mmol/L (ref 3.5–5.1)
Sodium: 138 mmol/L (ref 135–145)

## 2017-03-13 NOTE — Discharge Instructions (Signed)
Please follow-up with the orthopedic doctor, Dr. Luiz Blare, for further evaluation of your compression fracture. You can take Tylenol as prescribed over-the-counter for your back pain. Please return to the emergency department if you develop any new or worsening symptoms.

## 2017-03-13 NOTE — ED Notes (Signed)
PTAR contacted for tx back to [facility] 

## 2017-03-22 ENCOUNTER — Other Ambulatory Visit: Payer: Self-pay | Admitting: Internal Medicine

## 2017-04-06 ENCOUNTER — Other Ambulatory Visit (HOSPITAL_COMMUNITY): Payer: Self-pay | Admitting: Interventional Radiology

## 2017-04-13 ENCOUNTER — Telehealth: Payer: Self-pay | Admitting: Cardiovascular Disease

## 2017-04-13 NOTE — Telephone Encounter (Signed)
New Message  [name] from [facility] call to see if pt INR was received via fax. Please call back to discuss

## 2017-04-13 NOTE — Telephone Encounter (Signed)
Called number provided for [facility] and the number is not in service.   Per our record pt on Eliquis and should not need PT/INR.   Tried calling [facility] facility and patient not admitted there.  [facility] - spoke to both Karens at here and neither called nor do they have a patient with name/DOB that matches this patient.   [facility] [employer] [facility] - their office is closed today.   Called Daughter and pt at [facility].

## 2017-04-13 NOTE — Telephone Encounter (Signed)
Spoke with Clydie Braun at [facility]. She wanted to ensure that we received pts device transmission, not INR. Thus, transferred Clydie Braun to [place] in Device clinic to assist with finding out if transmission was received.

## 2017-04-17 ENCOUNTER — Telehealth (HOSPITAL_COMMUNITY): Payer: Self-pay

## 2017-04-17 NOTE — Telephone Encounter (Signed)
Called to schedule mri, pt saw an ortho doctor and is doing much better. Did not need MRI. AW

## 2017-04-20 ENCOUNTER — Telehealth: Payer: Self-pay | Admitting: Cardiology

## 2017-04-20 NOTE — Telephone Encounter (Signed)
New Message:    Please call,pt is having problems with her Device.

## 2017-04-20 NOTE — Telephone Encounter (Signed)
Spoke to [place] w/[facility] about patient's monitor. Clydie Braun said that they have been trying to send a transmission, but have been unsuccessful. [phone] for tech svcs. Clydie Braun verbalized understanding.

## 2017-05-02 ENCOUNTER — Ambulatory Visit: Payer: Medicare Other | Admitting: *Deleted

## 2017-05-02 DIAGNOSIS — I495 Sick sinus syndrome: Secondary | ICD-10-CM

## 2017-05-03 NOTE — Progress Notes (Signed)
Remote pacemaker transmission.   

## 2017-05-04 ENCOUNTER — Encounter: Payer: Self-pay | Admitting: Cardiology

## 2017-05-13 ENCOUNTER — Inpatient Hospital Stay (HOSPITAL_COMMUNITY)
Admission: EM | Admit: 2017-05-13 | Discharge: 2017-05-16 | DRG: 872 | Disposition: A | Discharge status: Hospice | Payer: Medicare Other | Attending: Internal Medicine | Admitting: Internal Medicine

## 2017-05-13 ENCOUNTER — Emergency Department (HOSPITAL_COMMUNITY): Payer: Medicare Other

## 2017-05-13 ENCOUNTER — Encounter (HOSPITAL_COMMUNITY): Payer: Self-pay | Admitting: Emergency Medicine

## 2017-05-13 DIAGNOSIS — A419 Sepsis, unspecified organism: Secondary | ICD-10-CM | POA: Diagnosis not present

## 2017-05-13 DIAGNOSIS — I5181 Takotsubo syndrome: Secondary | ICD-10-CM | POA: Diagnosis present

## 2017-05-13 DIAGNOSIS — E1122 Type 2 diabetes mellitus with diabetic chronic kidney disease: Secondary | ICD-10-CM | POA: Diagnosis present

## 2017-05-13 DIAGNOSIS — I509 Heart failure, unspecified: Secondary | ICD-10-CM | POA: Diagnosis present

## 2017-05-13 DIAGNOSIS — Z7901 Long term (current) use of anticoagulants: Secondary | ICD-10-CM | POA: Diagnosis not present

## 2017-05-13 DIAGNOSIS — D649 Anemia, unspecified: Secondary | ICD-10-CM | POA: Diagnosis present

## 2017-05-13 DIAGNOSIS — Z79899 Other long term (current) drug therapy: Secondary | ICD-10-CM

## 2017-05-13 DIAGNOSIS — IMO0002 Reserved for concepts with insufficient information to code with codable children: Secondary | ICD-10-CM | POA: Diagnosis present

## 2017-05-13 DIAGNOSIS — E118 Type 2 diabetes mellitus with unspecified complications: Secondary | ICD-10-CM | POA: Diagnosis not present

## 2017-05-13 DIAGNOSIS — Z992 Dependence on renal dialysis: Secondary | ICD-10-CM | POA: Diagnosis not present

## 2017-05-13 DIAGNOSIS — N189 Chronic kidney disease, unspecified: Secondary | ICD-10-CM | POA: Diagnosis present

## 2017-05-13 DIAGNOSIS — Z880 Allergy status to penicillin: Secondary | ICD-10-CM | POA: Diagnosis not present

## 2017-05-13 DIAGNOSIS — Z950 Presence of cardiac pacemaker: Secondary | ICD-10-CM | POA: Diagnosis not present

## 2017-05-13 DIAGNOSIS — Z66 Do not resuscitate: Secondary | ICD-10-CM | POA: Diagnosis present

## 2017-05-13 DIAGNOSIS — I428 Other cardiomyopathies: Secondary | ICD-10-CM | POA: Diagnosis present

## 2017-05-13 DIAGNOSIS — R638 Other symptoms and signs concerning food and fluid intake: Secondary | ICD-10-CM

## 2017-05-13 DIAGNOSIS — Z90710 Acquired absence of both cervix and uterus: Secondary | ICD-10-CM

## 2017-05-13 DIAGNOSIS — I10 Essential (primary) hypertension: Secondary | ICD-10-CM | POA: Diagnosis not present

## 2017-05-13 DIAGNOSIS — Z7189 Other specified counseling: Secondary | ICD-10-CM | POA: Diagnosis not present

## 2017-05-13 DIAGNOSIS — E11649 Type 2 diabetes mellitus with hypoglycemia without coma: Secondary | ICD-10-CM | POA: Diagnosis present

## 2017-05-13 DIAGNOSIS — F5089 Other specified eating disorder: Secondary | ICD-10-CM | POA: Diagnosis present

## 2017-05-13 DIAGNOSIS — R627 Adult failure to thrive: Secondary | ICD-10-CM | POA: Diagnosis present

## 2017-05-13 DIAGNOSIS — Z515 Encounter for palliative care: Secondary | ICD-10-CM | POA: Diagnosis not present

## 2017-05-13 DIAGNOSIS — I252 Old myocardial infarction: Secondary | ICD-10-CM

## 2017-05-13 DIAGNOSIS — E1165 Type 2 diabetes mellitus with hyperglycemia: Secondary | ICD-10-CM | POA: Diagnosis not present

## 2017-05-13 DIAGNOSIS — F0390 Unspecified dementia without behavioral disturbance: Secondary | ICD-10-CM | POA: Diagnosis not present

## 2017-05-13 DIAGNOSIS — E872 Acidosis: Secondary | ICD-10-CM | POA: Diagnosis present

## 2017-05-13 DIAGNOSIS — N179 Acute kidney failure, unspecified: Secondary | ICD-10-CM | POA: Diagnosis present

## 2017-05-13 DIAGNOSIS — I4891 Unspecified atrial fibrillation: Secondary | ICD-10-CM | POA: Diagnosis present

## 2017-05-13 DIAGNOSIS — F0391 Unspecified dementia with behavioral disturbance: Secondary | ICD-10-CM | POA: Diagnosis present

## 2017-05-13 DIAGNOSIS — R4182 Altered mental status, unspecified: Secondary | ICD-10-CM | POA: Diagnosis not present

## 2017-05-13 DIAGNOSIS — I130 Hypertensive heart and chronic kidney disease with heart failure and stage 1 through stage 4 chronic kidney disease, or unspecified chronic kidney disease: Secondary | ICD-10-CM | POA: Diagnosis present

## 2017-05-13 DIAGNOSIS — E1151 Type 2 diabetes mellitus with diabetic peripheral angiopathy without gangrene: Secondary | ICD-10-CM | POA: Diagnosis present

## 2017-05-13 DIAGNOSIS — I959 Hypotension, unspecified: Secondary | ICD-10-CM | POA: Diagnosis present

## 2017-05-13 DIAGNOSIS — Z7984 Long term (current) use of oral hypoglycemic drugs: Secondary | ICD-10-CM | POA: Diagnosis not present

## 2017-05-13 DIAGNOSIS — F03C0 Unspecified dementia, severe, without behavioral disturbance, psychotic disturbance, mood disturbance, and anxiety: Secondary | ICD-10-CM | POA: Diagnosis present

## 2017-05-13 DIAGNOSIS — E785 Hyperlipidemia, unspecified: Secondary | ICD-10-CM | POA: Diagnosis present

## 2017-05-13 DIAGNOSIS — Z9861 Coronary angioplasty status: Secondary | ICD-10-CM

## 2017-05-13 DIAGNOSIS — E860 Dehydration: Secondary | ICD-10-CM | POA: Diagnosis present

## 2017-05-13 DIAGNOSIS — I2510 Atherosclerotic heart disease of native coronary artery without angina pectoris: Secondary | ICD-10-CM | POA: Diagnosis present

## 2017-05-13 LAB — COMPREHENSIVE METABOLIC PANEL
ALBUMIN: 3.6 g/dL (ref 3.5–5.0)
ALK PHOS: 67 U/L (ref 38–126)
ALT: 25 U/L (ref 14–54)
AST: 31 U/L (ref 15–41)
Anion gap: 16 — ABNORMAL HIGH (ref 5–15)
BILIRUBIN TOTAL: 0.6 mg/dL (ref 0.3–1.2)
BUN: 73 mg/dL — AB (ref 6–20)
CALCIUM: 9.4 mg/dL (ref 8.9–10.3)
CO2: 25 mmol/L (ref 22–32)
CREATININE: 3.56 mg/dL — AB (ref 0.44–1.00)
Chloride: 97 mmol/L — ABNORMAL LOW (ref 101–111)
GFR calc Af Amer: 13 mL/min — ABNORMAL LOW (ref >60)
GFR, EST NON AFRICAN AMERICAN: 11 mL/min — AB (ref >60)
GLUCOSE: 310 mg/dL — AB (ref 65–99)
POTASSIUM: 5.1 mmol/L (ref 3.5–5.1)
Sodium: 138 mmol/L (ref 135–145)
TOTAL PROTEIN: 7.2 g/dL (ref 6.5–8.1)

## 2017-05-13 LAB — PROTIME-INR
INR: 1.38
Prothrombin Time: 17.1 seconds — ABNORMAL HIGH (ref 11.4–15.2)

## 2017-05-13 LAB — URINALYSIS, ROUTINE W REFLEX MICROSCOPIC
BILIRUBIN URINE: NEGATIVE
GLUCOSE, UA: NEGATIVE mg/dL
Ketones, ur: NEGATIVE mg/dL
Nitrite: NEGATIVE
PH: 5.5 (ref 5.0–8.0)
Protein, ur: NEGATIVE mg/dL
SPECIFIC GRAVITY, URINE: 1.015 (ref 1.005–1.030)

## 2017-05-13 LAB — URINALYSIS, MICROSCOPIC (REFLEX)

## 2017-05-13 LAB — CBC WITH DIFFERENTIAL/PLATELET
Basophils Absolute: 0 10*3/uL (ref 0.0–0.1)
Basophils Relative: 0 %
Eosinophils Absolute: 0 10*3/uL (ref 0.0–0.7)
Eosinophils Relative: 0 %
HEMATOCRIT: 26.6 % — AB (ref 36.0–46.0)
HEMOGLOBIN: 8.5 g/dL — AB (ref 12.0–15.0)
LYMPHS ABS: 1.4 10*3/uL (ref 0.7–4.0)
Lymphocytes Relative: 10 %
MCH: 30.6 pg (ref 26.0–34.0)
MCHC: 32 g/dL (ref 30.0–36.0)
MCV: 95.7 fL (ref 78.0–100.0)
MONO ABS: 1.3 10*3/uL — AB (ref 0.1–1.0)
MONOS PCT: 10 %
NEUTROS ABS: 10.9 10*3/uL — AB (ref 1.7–7.7)
NEUTROS PCT: 80 %
Platelets: 289 10*3/uL (ref 150–400)
RBC: 2.78 MIL/uL — ABNORMAL LOW (ref 3.87–5.11)
RDW: 13.5 % (ref 11.5–15.5)
WBC: 13.6 10*3/uL — ABNORMAL HIGH (ref 4.0–10.5)

## 2017-05-13 LAB — I-STAT CG4 LACTIC ACID, ED: Lactic Acid, Venous: 5.92 mmol/L (ref 0.5–1.9)

## 2017-05-13 LAB — GLUCOSE, CAPILLARY
GLUCOSE-CAPILLARY: 114 mg/dL — AB (ref 65–99)
GLUCOSE-CAPILLARY: 58 mg/dL — AB (ref 65–99)
GLUCOSE-CAPILLARY: 82 mg/dL (ref 65–99)
Glucose-Capillary: 45 mg/dL — ABNORMAL LOW (ref 65–99)
Glucose-Capillary: 126 mg/dL — ABNORMAL HIGH (ref 65–99)

## 2017-05-13 LAB — I-STAT TROPONIN, ED: Troponin i, poc: 0.03 ng/mL (ref 0.00–0.08)

## 2017-05-13 LAB — VALPROIC ACID LEVEL: Valproic Acid Lvl: 59 ug/mL (ref 50.0–100.0)

## 2017-05-13 LAB — LACTIC ACID, PLASMA: LACTIC ACID, VENOUS: 2.8 mmol/L — AB (ref 0.5–1.9)

## 2017-05-13 LAB — MRSA PCR SCREENING: MRSA BY PCR: POSITIVE — AB

## 2017-05-13 LAB — MAGNESIUM: MAGNESIUM: 1.9 mg/dL (ref 1.7–2.4)

## 2017-05-13 MED ORDER — ALPRAZOLAM 0.25 MG PO TABS
0.2500 mg | ORAL_TABLET | Freq: Two times a day (BID) | ORAL | Status: DC
  Administered 2017-05-13 – 2017-05-15 (×4): 0.25 mg via ORAL
  Filled 2017-05-13 (×4): qty 1

## 2017-05-13 MED ORDER — MUPIROCIN 2 % EX OINT
1.0000 "application " | TOPICAL_OINTMENT | Freq: Two times a day (BID) | CUTANEOUS | Status: DC
  Administered 2017-05-14 – 2017-05-15 (×5): 1 via NASAL
  Filled 2017-05-13 (×5): qty 22

## 2017-05-13 MED ORDER — ACETAMINOPHEN 325 MG PO TABS: 650.0000 mg | ORAL_TABLET | Freq: Four times a day (QID) | ORAL | Status: DC | PRN

## 2017-05-13 MED ORDER — DEXTROSE 50 % IV SOLN
INTRAVENOUS | Status: AC
  Administered 2017-05-13: 25 mL
  Filled 2017-05-13: qty 50

## 2017-05-13 MED ORDER — SODIUM CHLORIDE 0.9 % IV BOLUS (SEPSIS)
1000.0000 mL | Freq: Once | INTRAVENOUS | Status: AC
  Administered 2017-05-13: 1000 mL via INTRAVENOUS

## 2017-05-13 MED ORDER — SODIUM CHLORIDE 0.9 % IV SOLN
INTRAVENOUS | Status: DC
  Administered 2017-05-13 – 2017-05-14 (×2): via INTRAVENOUS

## 2017-05-13 MED ORDER — DEXTROSE 5 % IV SOLN
1.0000 g | INTRAVENOUS | Status: DC
  Administered 2017-05-14: 1 g via INTRAVENOUS
  Filled 2017-05-13 (×2): qty 1

## 2017-05-13 MED ORDER — QUETIAPINE FUMARATE 25 MG PO TABS
25.0000 mg | ORAL_TABLET | Freq: Two times a day (BID) | ORAL | Status: DC
  Administered 2017-05-13 – 2017-05-15 (×5): 25 mg via ORAL
  Filled 2017-05-13 (×6): qty 1

## 2017-05-13 MED ORDER — ONDANSETRON HCL 4 MG PO TABS
4.0000 mg | ORAL_TABLET | Freq: Four times a day (QID) | ORAL | Status: DC | PRN
Start: 2017-05-13 — End: 2017-05-16

## 2017-05-13 MED ORDER — LORAZEPAM 2 MG/ML IJ SOLN
1.0000 mg | INTRAMUSCULAR | Status: DC | PRN
  Administered 2017-05-14 – 2017-05-16 (×7): 1 mg via INTRAVENOUS
  Filled 2017-05-13 (×7): qty 1

## 2017-05-13 MED ORDER — ESCITALOPRAM OXALATE 10 MG PO TABS
10.0000 mg | ORAL_TABLET | Freq: Every day | ORAL | Status: DC
  Administered 2017-05-14 – 2017-05-15 (×2): 10 mg via ORAL
  Filled 2017-05-13 (×3): qty 1

## 2017-05-13 MED ORDER — APIXABAN 2.5 MG PO TABS
2.5000 mg | ORAL_TABLET | Freq: Two times a day (BID) | ORAL | Status: DC
  Administered 2017-05-13 – 2017-05-15 (×5): 2.5 mg via ORAL
  Filled 2017-05-13 (×6): qty 1

## 2017-05-13 MED ORDER — INSULIN ASPART 100 UNIT/ML ~~LOC~~ SOLN: 0.0000 [IU] | Freq: Every day | SUBCUTANEOUS | Status: DC

## 2017-05-13 MED ORDER — CEFEPIME HCL 2 G IJ SOLR
2.0000 g | Freq: Once | INTRAMUSCULAR | Status: AC
  Administered 2017-05-13: 2 g via INTRAVENOUS
  Filled 2017-05-13: qty 2

## 2017-05-13 MED ORDER — ONDANSETRON HCL 4 MG/2ML IJ SOLN: 4.0000 mg | Freq: Four times a day (QID) | INTRAMUSCULAR | Status: DC | PRN

## 2017-05-13 MED ORDER — SODIUM CHLORIDE 0.9% FLUSH
3.0000 mL | Freq: Two times a day (BID) | INTRAVENOUS | Status: DC
  Administered 2017-05-14 – 2017-05-15 (×3): 3 mL via INTRAVENOUS

## 2017-05-13 MED ORDER — MORPHINE SULFATE (PF) 4 MG/ML IV SOLN
4.0000 mg | INTRAVENOUS | Status: DC | PRN
  Administered 2017-05-15: 4 mg via INTRAVENOUS
  Filled 2017-05-13: qty 1

## 2017-05-13 MED ORDER — ACETAMINOPHEN 650 MG RE SUPP: 650.0000 mg | Freq: Four times a day (QID) | RECTAL | Status: DC | PRN

## 2017-05-13 MED ORDER — INSULIN ASPART 100 UNIT/ML ~~LOC~~ SOLN: 0.0000 [IU] | SUBCUTANEOUS | Status: DC

## 2017-05-13 MED ORDER — INSULIN ASPART 100 UNIT/ML ~~LOC~~ SOLN
0.0000 [IU] | Freq: Three times a day (TID) | SUBCUTANEOUS | Status: DC
Start: 2017-05-13 — End: 2017-05-16
  Administered 2017-05-14: 1 [IU] via SUBCUTANEOUS
  Administered 2017-05-14: 2 [IU] via SUBCUTANEOUS
  Administered 2017-05-15: 1 [IU] via SUBCUTANEOUS
  Administered 2017-05-15: 2 [IU] via SUBCUTANEOUS
  Administered 2017-05-15: 1 [IU] via SUBCUTANEOUS
  Administered 2017-05-16: 2 [IU] via SUBCUTANEOUS

## 2017-05-13 MED ORDER — CHLORHEXIDINE GLUCONATE CLOTH 2 % EX PADS
6.0000 | MEDICATED_PAD | Freq: Every day | CUTANEOUS | Status: DC
  Administered 2017-05-14 – 2017-05-16 (×3): 6 via TOPICAL

## 2017-05-13 NOTE — ED Notes (Signed)
Attempted to call report

## 2017-05-13 NOTE — Progress Notes (Signed)
Pharmacy Antibiotic Note  [name] is a 82 y.o. female admitted from [facility] on 05/13/2017 with altered mental status and possible sepsis with likely source of urine.   Pharmacy has been consulted for Cefepime dosing.  Patient has penicillin allergy of rash.  Weight per RN: 160 lbs 04/17/17 per [facility] Patient unable to provide history.   *Labs now back: SCr 3.56 with estimated CrCl ~10-15 ml/min.  Lactic acid 5.92.   Plan: Cefepime 1g IV every 24 hours -next dose 8/5 PM.  Monitor renal function and need to adjust further.  Monitor clinical status.   Temp (24hrs), Avg:98.4 F (36.9 C), Min:98.4 F (36.9 C), Max:98.4 F (36.9 C)   Recent Labs Lab 05/13/17 1413 05/13/17 1434  CREATININE 3.56*  --   LATICACIDVEN  --  5.92*    CrCl cannot be calculated (Unknown ideal weight.).    Allergies  Allergen Reactions  . Penicillins Rash    Happened many years ago Has patient had a PCN reaction causing immediate rash, facial/tongue/throat swelling, SOB or lightheadedness with hypotension: Yes Has patient had a PCN reaction causing severe rash involving mucus membranes or skin necrosis: Unknown Has patient had a PCN reaction that required hospitalization Unknown Has patient had a PCN reaction occurring within the last 10 years: No If all of the above answers are "NO", then may proceed with Cephalosporin use.     Antimicrobials this admission: Cefepime 8/4 >>  Dose adjustments this admission:  Microbiology results:  8/4 BCx:  8/4 UCx:   Thank you for allowing pharmacy to be a part of this patient's care.  Link Snuffer, PharmD, BCPS Clinical Pharmacist Clinical phone 05/13/2017 until 3:30PM [phone] After hours, please call [phone] 05/13/2017 3:27 PM

## 2017-05-13 NOTE — Progress Notes (Signed)
Pharmacy Antibiotic Note  [name] is a 82 y.o. female admitted from [facility] on 05/13/2017 with altered mental status and possible sepsis with likely source of urine.   Pharmacy has been consulted for Cefepime dosing.  Patient has penicillin allergy of rash.  Weight per RN: 160 lbs 04/17/17 per [facility] Patient unable to provide history.  Last SCr 1.05 on 03/27/17  Plan: Cefepime 2g IV x1 now, then follow-up renal function for further dosing. *Dose was pulled from Pyxis and handed to RN to administer.     No data recorded.  No results for input(s): WBC, CREATININE, LATICACIDVEN, VANCOTROUGH, VANCOPEAK, VANCORANDOM, GENTTROUGH, GENTPEAK, GENTRANDOM, TOBRATROUGH, TOBRAPEAK, TOBRARND, AMIKACINPEAK, AMIKACINTROU, AMIKACIN in the last 168 hours.  CrCl cannot be calculated (Patient's most recent lab result is older than the maximum 21 days allowed.).    Allergies  Allergen Reactions  . Penicillins Rash    Happened many years ago Has patient had a PCN reaction causing immediate rash, facial/tongue/throat swelling, SOB or lightheadedness with hypotension: Yes Has patient had a PCN reaction causing severe rash involving mucus membranes or skin necrosis: Unknown Has patient had a PCN reaction that required hospitalization Unknown Has patient had a PCN reaction occurring within the last 10 years: No If all of the above answers are "NO", then may proceed with Cephalosporin use.     Antimicrobials this admission: Cefepime 8/4 >>  Dose adjustments this admission:  Microbiology results:  8/4 BCx:  8/4 UCx:   Thank you for allowing pharmacy to be a part of this patient's care.  Link Snuffer, PharmD, BCPS Clinical Pharmacist Clinical phone 05/13/2017 until 3:30PM [phone] After hours, please call [phone] 05/13/2017 1:52 PM

## 2017-05-13 NOTE — Progress Notes (Signed)
CRITICAL VALUE ALERT  Critical Value:  Lacitic acid 2.8  Date & Time Notied:  05/13/2017 2042  Provider Notified: On-call MD Selena Batten  Orders Received/Actions taken: continue to monitor

## 2017-05-13 NOTE — ED Notes (Signed)
Got patient undress on the monitor did ekg shown to Dr Silverio Lay patient is resting with family at bedside

## 2017-05-13 NOTE — ED Notes (Signed)
Patient transported to CT 

## 2017-05-13 NOTE — ED Triage Notes (Signed)
Pt BIB EMS from [facility] for increased AMS. Per facility staff, pt "is acting different than normal" and has increased sugar. Per EMS pt alert and grabbing them in route and oriented to self. Resp e/u. EDP at bedside.   Received 500 cc bolus PTA.

## 2017-05-13 NOTE — H&P (Signed)
History and Physical    [name]:[id number] DOB: 10-Jan-1935 DOA: 05/13/2017   PCP: Geoffry Paradise, MD   Attending physician: Melynda Ripple  Patient coming from/Resides with: ALF/[facility]  Chief Complaint: Altered mentation/agitation  HPI: [name] is a 82 y.o. female with medical history significant for CAD catheterization 2011 with EF 35-40%, nonischemic cardiomyopathy with EF 25% based on 2011 echo, atrial fibrillation on anticoagulation, sick sinus syndrome status post pacemaker 2004, hypertension, dyslipidemia, diabetes on oral agents and dementia. Patient has had steady functional decline over the past 12 months and in May she was placed in an assisted living facility. She has lifelong issues with anxiety and mild agitation which has been further worsened by her underlying dementia diagnosis. The family reports that for months she has had poor oral intake including her favorite foods. Patient was at her baseline mentation on Thursday but over the next several days had rapid decline became more agitated.  She was brought to the ER where she had a rectal temperature of 98.4, she had borderline hypotension with a BP of 97/36 with an MAP of 54. She was found to have an acute kidney injury with a BUN of 73 and a creatinine of 3.56, glucose elevated at 310, lactic acid elevated at 5.9 to, white count elevated at 13,600 neutrophils 80% and absolute neutrophils 10.9%. Chest x-ray without evidence of pneumonia. EDP has opted to treat as sepsis of unknown source so has begun broad-spectrum antibiotics. Patient is a DO NOT RESUSCITATE and after lengthy discussion with family we have opted to continue antibiotics for at least 24 hours along with fluids and minimization of offending medications now and likely in the future if patient should survive. Plan is to reevaluate patient's response to above treatments before transitioning to comfort care or discontinuing treatments. In addition,  should patient survive family is interested in transitioning her to skilled nursing facility so PT/OT evaluation ordered. Palliative care evaluation also requested to assist family in better formulating goals of care/completing MOST form regarding aggressiveness of care after discharge from facility and when appropriate to return to hospital. [facility] examination of the patient she was quite alert and confused but was also joking during my interview and examination.  ED Course:  Vital Signs: BP (!) 114/45   Pulse 74   Temp 98.4 F (36.9 C) (Rectal)   Resp 16   Wt 72.6 kg (160 lb) Comment: Obtained from [facility] on 04/17/17  SpO2 100%   BMI 25.82 kg/m  PCXR: Neg CT head: No acute intracranial abnormality Lab data: Sodium 138, potassium 5.1, chloride 97, CO2 25, glucose 310, BUN 73, creatinine 3.56, anion gap 16, poc troponin 0.03, lactic acid 3.92, white count 13,600 with neutrophils 80% and absolute feels 10.9%, hemoglobin 8.5, platelets 289,000, PT 17.1, INR 1.38, valproic acid 59, urinalysis appears to be abnormal and consistent with UTI with hazy appearance, many bacteria, trace hemoglobin, large leukocyte, nitrite negative, non-squamous epithelials present as well as squamous epithelial 6-30, WBC 6-30, blood cultures and urine culture pending Medications and treatments: Normal saline bolus 3 L, Maxipime 2 g IV 1  Review of Systems:  In addition to the HPI above,  No Fever-chills, myalgias or other constitutional symptoms No Headache, changes with Vision or hearing, new weakness, tingling, numbness in any extremity, dizziness, dysarthria or word finding difficulty, gait disturbance or imbalance, tremors or seizure activity No Chest pain, Cough or Shortness of Breath, palpitations, orthopnea or DOE No reported Abdominal pain, N/V, melena,hematochezia,  dark tarry stools, constipation No dysuria, malodorous urine, hematuria or flank pain No new skin rashes, lesions, masses or bruises, No  new joint pains, aches, swelling or redness No polyuria, polydypsia or polyphagia   Past Medical History:  Diagnosis Date  . ANXIETY   . Arthritis   . Atrial fibrillation (HCC)    on coumadin  . CARDIOMYOPATHY    Nonischemic; nonobstructive CAD, EF 35-40% by cath 07/2010, EF 25% by echo 07/2010  . CHF   . Coronary artery disease    non obstructive  . DEMENTIA   . Depression   . Diabetes mellitus   . GERD   . HYPERCHOLESTEROLEMIA   . Hypertension   . Myocardial infarction (HCC)   . Pacemaker    Medtronic  . Shortness of breath   . SICK SINUS SYNDROME    s/p Medtronic PPM '04, generator change '12  . Takotsubo cardiomyopathy    2006    Past Surgical History:  Procedure Laterality Date  . BACK SURGERY    . CERVICAL SPINE SURGERY    . CORONARY ANGIOPLASTY    . PACEMAKER INSERTION  2004, 2012   Medtronic   . RADICAL HYSTERECTOMY      Social History   Social History  . Marital status: Widowed    Spouse name: N/A  . Number of children: N/A  . Years of education: N/A   Occupational History  . Not on file.   Social History Main Topics  . Smoking status: Never Smoker  . Smokeless tobacco: Never Used  . Alcohol use No  . Drug use: No  . Sexual activity: No   Other Topics Concern  . Not on file   Social History Narrative  . No narrative on file    Mobility: Requires assistance Work history: Not applicable   Allergies  Allergen Reactions  . Penicillins Rash    Happened many years ago Has patient had a PCN reaction causing immediate rash, facial/tongue/throat swelling, SOB or lightheadedness with hypotension: Yes Has patient had a PCN reaction causing severe rash involving mucus membranes or skin necrosis: Unknown Has patient had a PCN reaction that required hospitalization Unknown Has patient had a PCN reaction occurring within the last 10 years: No If all of the above answers are "NO", then may proceed with Cephalosporin use.     Family History    Problem Relation Age of Onset  . Other Unknown        no known CAD    Prior to Admission medications   Medication Sig Start Date End Date Taking? Authorizing Provider  acetaminophen (TYLENOL) 500 MG tablet Take 1 tablet (500 mg total) by mouth every 6 (six) hours as needed for mild pain or moderate pain. 06/18/16  Yes Everlene Farrier, PA-C  ALPRAZolam Prudy Feeler) 0.5 MG tablet Take 0.25 mg by mouth 2 (two) times daily.    Yes [provider]  alum & mag hydroxide-simeth (MINTOX) 200-200-20 MG/5ML suspension Take 30 mLs by mouth every 6 (six) hours as needed for indigestion or heartburn.   Yes [provider]  apixaban (ELIQUIS) 2.5 MG TABS tablet Take 1 tablet (2.5 mg total) by mouth 2 (two) times daily. 02/21/17  Yes Wendall Stade, MD  benazepril (LOTENSIN) 20 MG tablet TAKE 1/2 TABLET BY MOUTH DAILY 01/06/17  Yes Wendall Stade, MD  benazepril (LOTENSIN) 20 MG tablet Take 20 mg by mouth daily.   Yes [provider]  divalproex (DEPAKOTE SPRINKLE) 125 MG capsule Take 125-250  mg by mouth 3 (three) times daily. 250 mg at 8 am. 125 mg at 2 pm. 250 mg at 8 pm   Yes [provider]  escitalopram (LEXAPRO) 10 MG tablet Take 10 mg by mouth daily.   Yes [provider]  furosemide (LASIX) 40 MG tablet TAKE 1/2 TABLET BY MOUTH IN THE MORNING AND 1 TABLET IN THE EVENING 03/22/17  Yes Wendall Stade, MD  glipiZIDE (GLUCOTROL) 5 MG tablet Take 5 mg by mouth 2 (two) times daily before a meal.  12/19/13  Yes [provider]  guaifenesin (ROBITUSSIN) 100 MG/5ML syrup Take 200 mg by mouth 4 (four) times daily as needed for cough.   Yes [provider]  loperamide (IMODIUM) 2 MG capsule Take 2 mg by mouth as needed for diarrhea or loose stools.   Yes [provider]  magnesium hydroxide (MILK OF MAGNESIA) 400 MG/5ML suspension Take 30 mLs by mouth at bedtime as needed for mild constipation.   Yes [provider]  metFORMIN (GLUCOPHAGE)  500 MG tablet Take 500 mg by mouth 2 (two) times daily with a meal.  12/19/13  Yes [provider]  metoprolol succinate (TOPROL-XL) 50 MG 24 hr tablet TAKE 1 TABLET BY MOUTH ONCE DAILY TAKE WITH OR IMMMEDIATELY FOLLOWINGA MEAL 01/06/17  Yes Wendall Stade, MD  neomycin-bacitracin-polymyxin (NEOSPORIN) ointment Apply 1 application topically as needed for wound care. apply to eye   Yes [provider]  nystatin (NYSTATIN) powder Apply topically 2 (two) times daily. For 14 days   Yes [provider]  QUEtiapine (SEROQUEL) 25 MG tablet Take 25 mg by mouth 2 (two) times daily.   Yes [provider]  spironolactone (ALDACTONE) 25 MG tablet Take 0.5 tablets (12.5 mg total) by mouth daily. 01/02/17  Yes Wendall Stade, MD  nitroGLYCERIN (NITROSTAT) 0.4 MG SL tablet Place 1 tablet (0.4 mg total) under the tongue every 5 (five) minutes as needed. For chest pain 02/21/17   Wendall Stade, MD    Physical Exam: Vitals:   05/13/17 1545 05/13/17 1600 05/13/17 1616 05/13/17 1628  BP: (!) 111/56 (!) 100/22 (!) 102/33 (!) 114/45  Pulse: 61 62 (!) 28 74  Resp: (!) 22 13 20 16   Temp:      TempSrc:      SpO2: 99% 100%  100%  Weight:          Constitutional: NAD, calm, comfortable Eyes: PERRL, lids and conjunctivae normal ENMT: Mucous membranes are Dry. Posterior pharynx clear of any exudate or lesions.edentulous Neck: normal, supple, no masses, no thyromegaly Respiratory: clear to auscultation bilaterally, no wheezing, no crackles. Normal respiratory effort. No accessory muscle use.  Cardiovascular: Regular rate and rhythm, no murmurs / rubs / gallops. No extremity edema. 2+ pedal pulses. No carotid bruits.  Abdomen: ?? Suprapubic tenderness that not reproduced consistently, no masses palpated. No hepatosplenomegaly. Bowel sounds positive.  Musculoskeletal: no clubbing / cyanosis. No joint deformity upper and lower extremities. Good ROM, no contractures. Normal muscle  tone.  Skin: no rashes, lesions, ulcers. No induration Neurologic: CN 2-12 grossly intact. Sensation intact, DTR normal. Strength 5/5 x all 4 extremities.  Psychiatric:  Alert and oriented x name only. Normal, jocular mood.    Labs on Admission: I have personally reviewed following labs and imaging studies  CBC:  Recent Labs Lab 05/13/17 1413  WBC 13.6*  NEUTROABS 10.9*  HGB 8.5*  HCT 26.6*  MCV 95.7  PLT 289   Basic Metabolic Panel:  Recent Labs Lab 05/13/17 1413  NA 138  K 5.1  CL 97*  CO2 25  GLUCOSE 310*  BUN 73*  CREATININE 3.56*  CALCIUM 9.4   GFR: CrCl cannot be calculated (Unknown ideal weight.). Liver Function Tests:  Recent Labs Lab 05/13/17 1413  AST 31  ALT 25  ALKPHOS 67  BILITOT 0.6  PROT 7.2  ALBUMIN 3.6   No results for input(s): LIPASE, AMYLASE in the last 168 hours. No results for input(s): AMMONIA in the last 168 hours. Coagulation Profile:  Recent Labs Lab 05/13/17 1548  INR 1.38   Cardiac Enzymes: No results for input(s): CKTOTAL, CKMB, CKMBINDEX, TROPONINI in the last 168 hours. BNP (last 3 results) No results for input(s): PROBNP in the last 8760 hours. HbA1C: No results for input(s): HGBA1C in the last 72 hours. CBG: No results for input(s): GLUCAP in the last 168 hours. Lipid Profile: No results for input(s): CHOL, HDL, LDLCALC, TRIG, CHOLHDL, LDLDIRECT in the last 72 hours. Thyroid Function Tests: No results for input(s): TSH, T4TOTAL, FREET4, T3FREE, THYROIDAB in the last 72 hours. Anemia Panel: No results for input(s): VITAMINB12, FOLATE, FERRITIN, TIBC, IRON, RETICCTPCT in the last 72 hours. Urine analysis:    Component Value Date/Time   COLORURINE STRAW (A) 03/12/2017 2355   APPEARANCEUR CLEAR 03/12/2017 2355   LABSPEC 1.011 03/12/2017 2355   PHURINE 6.0 03/12/2017 2355   GLUCOSEU NEGATIVE 03/12/2017 2355   HGBUR NEGATIVE 03/12/2017 2355   BILIRUBINUR NEGATIVE 03/12/2017 2355   KETONESUR NEGATIVE 03/12/2017  2355   PROTEINUR NEGATIVE 03/12/2017 2355   UROBILINOGEN 1.0 04/30/2012 1319   NITRITE NEGATIVE 03/12/2017 2355   LEUKOCYTESUR TRACE (A) 03/12/2017 2355   Sepsis Labs: @LABRCNTIP (procalcitonin:4,lacticidven:4) )No results found for this or any previous visit (from the past 240 hour(s)).   Radiological Exams on Admission: Ct Head Wo Contrast  Result Date: 05/13/2017 CLINICAL DATA:  increased AMS. Per facility staff, pt "is acting different than normal" and has increased sugar Copied from ER Triage note * Patient scanned x 2 with immobilization. Did not hold still EXAM: CT HEAD WITHOUT CONTRAST TECHNIQUE: Contiguous axial images were obtained from the base of the skull through the vertex without intravenous contrast. COMPARISON:  03/12/2017 FINDINGS: Brain: There is central and cortical atrophy. Periventricular white matter changes are consistent with small vessel disease. There small lacunar infarcts of the basal ganglia bilaterally. There is no intra or extra-axial fluid collection or mass lesion. The basilar cisterns and ventricles have a normal appearance. There is no CT evidence for acute infarction or hemorrhage. Vascular: There is atherosclerotic calcification of the carotid siphons. Skull: Normal. Negative for fracture or focal lesion. Sinuses/Orbits: No acute finding. Other: Study quality is degraded by patient motion artifact. IMPRESSION: 1. Significant atrophy and small vessel disease. 2. Remote lacunar infarcts of the basal ganglia. 3.  No evidence for acute intracranial abnormality. Electronically Signed   By: Norva Pavlov M.D.   On: 05/13/2017 14:45   Dg Chest Port 1 View  Result Date: 05/13/2017 CLINICAL DATA:  82 year old female with altered mental status EXAM: PORTABLE CHEST 1 VIEW COMPARISON:  Prior chest x-ray 07/02/2015 FINDINGS: Left subclavian approach cardiac rhythm maintenance device. Leads project over the right atrium and right ventricle. Stable cardiomegaly.  Atherosclerotic calcifications in the transverse aorta. No patchy air scratch then no new focal airspace opacity, pulmonary edema, pleural effusion or pneumothorax. Stable mild bronchitic changes. Incompletely imaged anterior cervical stabilization hardware. IMPRESSION: No active disease. Electronically Signed   By: Malachy Moan  M.D.   On: 05/13/2017 14:26    EKG: (Independently reviewed) 100% ventricular paced rhythm  Assessment/Plan Principal Problem:   ? Sepsis  -Patient presents with acute altered mentation as evidenced by agitation with findings of leukocytosis with left shift, borderline hypothermia, acute kidney injury, and markedly elevated lactic acid of 5.92 concerning for sepsis physiology -Abnormal physiology could be explained by severe dehydration with acidemia secondary to offending medications (Lasix, metformin, ACE inhibitor) -Urinalysis abnormal so potential source for infection -Broad spectrum antibiotics for now -Procalcitonin -Repeat lactic acid at least once -Follow up on blood cultures and urine culture -No plans to escalate care and if patient worsened or not improving in 24 hours consideration will be given to de-escalating care and focusing on comfort (see below)  Active Problems:   Acute kidney injury  -Baseline renal function: 27 and 1.05 in June -Current renal function: 73 and 3.56 -Multifactorial etiology: Poor oral intake and setting of continued administration of Lasix and ACE inhibitor as well as metformin -Hold offending medications -IV fluid hydration at 100/hr -Repeat labs in a.m. -Acidemia could be 2/2 acute kidney injury alone    Severe dementia -Patient with progressive failure to thrive over the past year and has recently been placed in a memory care ALF -Patient may be ready to transition to a SNF -PT/OT evaluation -Currently DO NOT RESUSCITATE but family needs assistance in evaluating end-of-life GOC and navigating the MOST form especially  regarding decisions about when appropriate to treat and not treat -Given response to above treatments, patient may be eligible for residential hospice    Anemia -Baseline hemoglobin 11 -Current hemoglobin 8.5 and no reports to family regarding bloody or dark stools -Repeat labs in a.m.    Takotsubo cardiomyopathy/NICM -Last echocardiogram completed 2011 with an EF of 25% and has not undergone follow-up echocardiogram since -Patient on beta blocker, ACE inhibitor and Lasix prior to admission -Currently with acute kidney injury so ACE inhibitor as well as Lasix on hold -Given failure to thrive and poor oral intake may need to possibly discontinue ACE inhibitor and Lasix    Type 2 diabetes mellitus, uncontrolled -Current CBG uncontrolled greater than 300 and could be reflective of underlying infectious process noting this is not typical for patient and she has apparently had poor oral intake and possible weight loss -HgbA1c -Hold Glucotrol and metformin -Given poor oral intake and risks for additional dehydration state and associated acute kidney injury would not resume metformin or Glucotrol and instead utilize SSI after discharge    Coronary artery disease -Currently asymptomatic -Last saw primary cardiologist Dr. [name] December 2017    SSS/AF -100% ventricular paced -Continue eliquis for now-may need to adjust dose given acute kidney injury-defer to pharmacy for recommendations -Beta blocker on hold secondary to suboptimal blood pressure readings    HTN (hypertension) -Home medications on hold secondary to suboptimal blood pressure readings and dehydration      DVT prophylaxis: Eliquis Code Status: DO NOT RESUSCITATE  Family Communication: Both daughters as well as sons-in-law at bedside  Disposition Plan: To be determined-evaluation process to see if eligible for SNF Consults called: None    Mikell Kazlauskas L. ANP-BC [facility] Pager [phone]   If 7PM-7AM,  please contact night-coverage [email or web address] Password [facility]  05/13/2017, 4:39 PM

## 2017-05-13 NOTE — ED Provider Notes (Signed)
[facility]-EMERGENCY DEPT Provider Note   CSN: [id number] Arrival date & time: 05/13/17  1339     History   Chief Complaint Chief Complaint  Patient presents with  . Altered Mental Status  . Hyperglycemia    HPI [name] is a 82 y.o. female history of atrial fibrillation on eliquis, cardiomyopathy with AICD, dementia here presenting with Altered mental status, poor by mouth intake. Patient lives at a memory care unit but family doesn't visit her very often. At baseline, patient is interactive and ambulatory but demented. Last time family checked on her was 2 days ago. She stopped eating and just has no appetite. Patient has no vomiting or reported fevers. Family checked on her today and noticed that she was very altered and this is not her baseline. Her blood sugar has been elevated as well. EMS noticed that CBG was 400 and this is unusual for her and she was hypotensive 80s. Patient has been follow with Dr. Lorain Childes. Patient has DO NOT RESUSCITATE in the chart.  The history is provided by the EMS personnel.   Level V caveat- dementia, AMS   Past Medical History:  Diagnosis Date  . ANXIETY   . Arthritis   . Atrial fibrillation (HCC)    on coumadin  . CARDIOMYOPATHY    Nonischemic; nonobstructive CAD, EF 35-40% by cath 07/2010, EF 25% by echo 07/2010  . CHF   . Coronary artery disease    non obstructive  . DEMENTIA   . Depression   . Diabetes mellitus   . GERD   . HYPERCHOLESTEROLEMIA   . Hypertension   . Myocardial infarction (HCC)   . Pacemaker    Medtronic  . Shortness of breath   . SICK SINUS SYNDROME    s/p Medtronic PPM '04, generator change '12  . Takotsubo cardiomyopathy    2006    Patient Active Problem List   Diagnosis Date Noted  . Sepsis (HCC) 05/13/2017  . Severe dementia 05/13/2017  . Acute kidney injury (HCC) 05/13/2017  . Type 2 diabetes mellitus, uncontrolled (HCC) 05/13/2017  . Coronary artery disease 05/13/2017  . Anemia 05/13/2017  . Chest  pain 02/01/2012  . Hypertension 02/01/2012  . Type 2 diabetes mellitus (HCC) 02/01/2012  . Pacemaker 02/02/2011  . Long term (current) use of anticoagulants 01/11/2011  . HYPERCHOLESTEROLEMIA 01/28/2009  . ANXIETY 01/28/2009  . MYOCARDIAL INFARCTION 01/28/2009  . Coronary atherosclerosis 01/28/2009  . CARDIOMYOPATHY 01/28/2009  . PAROXYSMAL ATRIAL FIBRILLATION 01/28/2009  . SICK SINUS SYNDROME 01/28/2009  . BRADYCARDIA 01/28/2009  . CHF 01/28/2009  . GERD 01/28/2009  . OSTEOARTHRITIS 01/28/2009  . EDEMA 01/28/2009  . SHORTNESS OF BREATH 01/28/2009    Past Surgical History:  Procedure Laterality Date  . BACK SURGERY    . CERVICAL SPINE SURGERY    . CORONARY ANGIOPLASTY    . PACEMAKER INSERTION  2004, 2012   Medtronic   . RADICAL HYSTERECTOMY      OB History    No data available       Home Medications    Prior to Admission medications   Medication Sig Start Date End Date Taking? Authorizing Provider  acetaminophen (TYLENOL) 500 MG tablet Take 1 tablet (500 mg total) by mouth every 6 (six) hours as needed for mild pain or moderate pain. 06/18/16  Yes Everlene Farrier, PA-C  ALPRAZolam Prudy Feeler) 0.5 MG tablet Take 0.25 mg by mouth 2 (two) times daily.    Yes [provider]  alum & mag hydroxide-simeth (MINTOX)  200-200-20 MG/5ML suspension Take 30 mLs by mouth every 6 (six) hours as needed for indigestion or heartburn.   Yes [provider]  apixaban (ELIQUIS) 2.5 MG TABS tablet Take 1 tablet (2.5 mg total) by mouth 2 (two) times daily. 02/21/17  Yes Wendall Stade, MD  benazepril (LOTENSIN) 20 MG tablet TAKE 1/2 TABLET BY MOUTH DAILY 01/06/17  Yes Wendall Stade, MD  benazepril (LOTENSIN) 20 MG tablet Take 20 mg by mouth daily.   Yes [provider]  divalproex (DEPAKOTE SPRINKLE) 125 MG capsule Take 125-250 mg by mouth 3 (three) times daily. 250 mg at 8 am. 125 mg at 2 pm. 250 mg at 8 pm   Yes [provider]  escitalopram (LEXAPRO) 10 MG  tablet Take 10 mg by mouth daily.   Yes [provider]  furosemide (LASIX) 40 MG tablet TAKE 1/2 TABLET BY MOUTH IN THE MORNING AND 1 TABLET IN THE EVENING 03/22/17  Yes Wendall Stade, MD  glipiZIDE (GLUCOTROL) 5 MG tablet Take 5 mg by mouth 2 (two) times daily before a meal.  12/19/13  Yes [provider]  guaifenesin (ROBITUSSIN) 100 MG/5ML syrup Take 200 mg by mouth 4 (four) times daily as needed for cough.   Yes [provider]  loperamide (IMODIUM) 2 MG capsule Take 2 mg by mouth as needed for diarrhea or loose stools.   Yes [provider]  magnesium hydroxide (MILK OF MAGNESIA) 400 MG/5ML suspension Take 30 mLs by mouth at bedtime as needed for mild constipation.   Yes [provider]  metFORMIN (GLUCOPHAGE) 500 MG tablet Take 500 mg by mouth 2 (two) times daily with a meal.  12/19/13  Yes [provider]  metoprolol succinate (TOPROL-XL) 50 MG 24 hr tablet TAKE 1 TABLET BY MOUTH ONCE DAILY TAKE WITH OR IMMMEDIATELY FOLLOWINGA MEAL 01/06/17  Yes Wendall Stade, MD  neomycin-bacitracin-polymyxin (NEOSPORIN) ointment Apply 1 application topically as needed for wound care. apply to eye   Yes [provider]  nystatin (NYSTATIN) powder Apply topically 2 (two) times daily. For 14 days   Yes [provider]  QUEtiapine (SEROQUEL) 25 MG tablet Take 25 mg by mouth 2 (two) times daily.   Yes [provider]  spironolactone (ALDACTONE) 25 MG tablet Take 0.5 tablets (12.5 mg total) by mouth daily. 01/02/17  Yes Wendall Stade, MD  nitroGLYCERIN (NITROSTAT) 0.4 MG SL tablet Place 1 tablet (0.4 mg total) under the tongue every 5 (five) minutes as needed. For chest pain 02/21/17   Wendall Stade, MD    Family History Family History  Problem Relation Age of Onset  . Other Unknown        no known CAD    Social History Social History  Substance Use Topics  . Smoking status: Never Smoker  . Smokeless tobacco: Never Used    . Alcohol use No     Allergies   Penicillins   Review of Systems Review of Systems  Neurological: Positive for dizziness and weakness.  All other systems reviewed and are negative.    Physical Exam Updated Vital Signs BP (!) 100/50   Pulse 62   Temp 98.4 F (36.9 C) (Rectal)   Resp 14   Wt 72.6 kg (160 lb) Comment: Obtained from [facility] on 04/17/17  SpO2 99%   BMI 25.82 kg/m   Physical Exam  Constitutional:  Confused, dementia   HENT:  Head: Normocephalic.  MM dry   Eyes: Pupils  are equal, round, and reactive to light. EOM are normal.  Neck: Normal range of motion. Neck supple.  Cardiovascular: Normal rate, regular rhythm and normal heart sounds.   Pulmonary/Chest:  Diminished bilaterally   Abdominal: Soft. Bowel sounds are normal.  No obvious pulsatile mass   Musculoskeletal: Normal range of motion.  No obvious decub ulcers, no obvious cellulitis   Neurological:  Confused, demented, moving all extremities   Skin: Skin is warm.  Psychiatric:  Unable   Nursing note and vitals reviewed.    ED Treatments / Results  Labs (all labs ordered are listed, but only abnormal results are displayed) Labs Reviewed  COMPREHENSIVE METABOLIC PANEL - Abnormal; Notable for the following:       Result Value   Chloride 97 (*)    Glucose, Bld 310 (*)    BUN 73 (*)    Creatinine, Ser 3.56 (*)    GFR calc non Af Amer 11 (*)    GFR calc Af Amer 13 (*)    Anion gap 16 (*)    All other components within normal limits  CBC WITH DIFFERENTIAL/PLATELET - Abnormal; Notable for the following:    WBC 13.6 (*)    RBC 2.78 (*)    Hemoglobin 8.5 (*)    HCT 26.6 (*)    Neutro Abs 10.9 (*)    Monocytes Absolute 1.3 (*)    All other components within normal limits  I-STAT CG4 LACTIC ACID, ED - Abnormal; Notable for the following:    Lactic Acid, Venous 5.92 (*)    All other components within normal limits  CULTURE, BLOOD (ROUTINE X 2)  CULTURE, BLOOD (ROUTINE X 2)  URINE  CULTURE  VALPROIC ACID LEVEL  URINALYSIS, ROUTINE W REFLEX MICROSCOPIC  PROTIME-INR  I-STAT TROPONIN, ED    EKG  EKG Interpretation  Date/Time:  Saturday May 13 2017 14:15:37 EDT Ventricular Rate:  71 PR Interval:    QRS Duration: 351 QT Interval:  534 QTC Calculation: 585 R Axis:   -87 Text Interpretation:  Ventricular-paced rhythm No further analysis attempted due to paced rhythm Baseline wander in lead(s) V3 No significant change since last tracing Confirmed by Richardean Canal [phone]) on 05/13/2017 3:15:56 PM       Radiology Ct Head Wo Contrast  Result Date: 05/13/2017 CLINICAL DATA:  increased AMS. Per facility staff, pt "is acting different than normal" and has increased sugar Copied from ER Triage note * Patient scanned x 2 with immobilization. Did not hold still EXAM: CT HEAD WITHOUT CONTRAST TECHNIQUE: Contiguous axial images were obtained from the base of the skull through the vertex without intravenous contrast. COMPARISON:  03/12/2017 FINDINGS: Brain: There is central and cortical atrophy. Periventricular white matter changes are consistent with small vessel disease. There small lacunar infarcts of the basal ganglia bilaterally. There is no intra or extra-axial fluid collection or mass lesion. The basilar cisterns and ventricles have a normal appearance. There is no CT evidence for acute infarction or hemorrhage. Vascular: There is atherosclerotic calcification of the carotid siphons. Skull: Normal. Negative for fracture or focal lesion. Sinuses/Orbits: No acute finding. Other: Study quality is degraded by patient motion artifact. IMPRESSION: 1. Significant atrophy and small vessel disease. 2. Remote lacunar infarcts of the basal ganglia. 3.  No evidence for acute intracranial abnormality. Electronically Signed   By: Norva Pavlov M.D.   On: 05/13/2017 14:45   Dg Chest Port 1 View  Result Date: 05/13/2017 CLINICAL DATA:  82 year old female with altered mental status  EXAM:  PORTABLE CHEST 1 VIEW COMPARISON:  Prior chest x-ray 07/02/2015 FINDINGS: Left subclavian approach cardiac rhythm maintenance device. Leads project over the right atrium and right ventricle. Stable cardiomegaly. Atherosclerotic calcifications in the transverse aorta. No patchy air scratch then no new focal airspace opacity, pulmonary edema, pleural effusion or pneumothorax. Stable mild bronchitic changes. Incompletely imaged anterior cervical stabilization hardware. IMPRESSION: No active disease. Electronically Signed   By: Malachy Moan M.D.   On: 05/13/2017 14:26    Procedures Procedures (including critical care time)  Medications Ordered in ED Medications  sodium chloride 0.9 % bolus 1,000 mL (1,000 mLs Intravenous New Bag/Given 05/13/17 1403)    And  sodium chloride 0.9 % bolus 1,000 mL (1,000 mLs Intravenous New Bag/Given 05/13/17 1404)    And  sodium chloride 0.9 % bolus 1,000 mL (1,000 mLs Intravenous New Bag/Given 05/13/17 1543)  ceFEPIme (MAXIPIME) 1 g in dextrose 5 % 50 mL IVPB (not administered)  0.9 %  sodium chloride infusion (not administered)  ceFEPIme (MAXIPIME) 2 g in dextrose 5 % 50 mL IVPB (0 g Intravenous Stopped 05/13/17 1543)     Initial Impression / Assessment and Plan / ED Course  I have reviewed the triage vital signs and the nursing notes.  Pertinent labs & imaging results that were available during my care of the patient were reviewed by me and considered in my medical decision making (see chart for details).     [name] is a 82 y.o. female here with failure to thrive. Hx of dementia and has been refusing to eat for several days. She is on eliquis so consider head bleed as well. BP 96/60 in the ED. I activated code sepsis given AMS, hypotension. Will get labs, culture, lactate, UA, CT head.   3:50 PM WBC 14. Cr 3.5. Lactate 5.9. I ordered cefepime IV (PCN allergy). CT head unremarkable. Both daughters at bedside. They confirmed DNR status. They don't want to  be overly aggressive and just want supportive treatment, IV abx. They understand that she likely has end stage dementia. They want to keep her comfortable as top priority.    Final Clinical Impressions(s) / ED Diagnoses   Final diagnoses:  None    New Prescriptions New Prescriptions   No medications on file     Charlynne Pander, MD 05/13/17 1550

## 2017-05-13 NOTE — ED Notes (Signed)
Portable xray at bedside.

## 2017-05-14 ENCOUNTER — Encounter (HOSPITAL_COMMUNITY): Payer: Self-pay

## 2017-05-14 DIAGNOSIS — E118 Type 2 diabetes mellitus with unspecified complications: Secondary | ICD-10-CM

## 2017-05-14 DIAGNOSIS — I10 Essential (primary) hypertension: Secondary | ICD-10-CM

## 2017-05-14 DIAGNOSIS — Z515 Encounter for palliative care: Secondary | ICD-10-CM

## 2017-05-14 DIAGNOSIS — E1165 Type 2 diabetes mellitus with hyperglycemia: Secondary | ICD-10-CM

## 2017-05-14 DIAGNOSIS — D649 Anemia, unspecified: Secondary | ICD-10-CM

## 2017-05-14 DIAGNOSIS — F0390 Unspecified dementia without behavioral disturbance: Secondary | ICD-10-CM

## 2017-05-14 DIAGNOSIS — N179 Acute kidney failure, unspecified: Secondary | ICD-10-CM

## 2017-05-14 DIAGNOSIS — I5181 Takotsubo syndrome: Secondary | ICD-10-CM

## 2017-05-14 DIAGNOSIS — A419 Sepsis, unspecified organism: Principal | ICD-10-CM

## 2017-05-14 DIAGNOSIS — N189 Chronic kidney disease, unspecified: Secondary | ICD-10-CM

## 2017-05-14 DIAGNOSIS — Z992 Dependence on renal dialysis: Secondary | ICD-10-CM

## 2017-05-14 LAB — URINE CULTURE: Culture: NO GROWTH

## 2017-05-14 LAB — CBC
HCT: 25.6 % — ABNORMAL LOW (ref 36.0–46.0)
Hemoglobin: 8.4 g/dL — ABNORMAL LOW (ref 12.0–15.0)
MCH: 31.5 pg (ref 26.0–34.0)
MCHC: 32.8 g/dL (ref 30.0–36.0)
MCV: 95.9 fL (ref 78.0–100.0)
PLATELETS: 142 10*3/uL — AB (ref 150–400)
RBC: 2.67 MIL/uL — ABNORMAL LOW (ref 3.87–5.11)
RDW: 14.1 % (ref 11.5–15.5)
WBC: 6.4 10*3/uL (ref 4.0–10.5)

## 2017-05-14 LAB — GLUCOSE, CAPILLARY
GLUCOSE-CAPILLARY: 69 mg/dL (ref 65–99)
GLUCOSE-CAPILLARY: 95 mg/dL (ref 65–99)
GLUCOSE-CAPILLARY: 135 mg/dL — AB (ref 65–99)
Glucose-Capillary: 53 mg/dL — ABNORMAL LOW (ref 65–99)
Glucose-Capillary: 108 mg/dL — ABNORMAL HIGH (ref 65–99)
Glucose-Capillary: 109 mg/dL — ABNORMAL HIGH (ref 65–99)
Glucose-Capillary: 62 mg/dL — ABNORMAL LOW (ref 65–99)
Glucose-Capillary: 158 mg/dL — ABNORMAL HIGH (ref 65–99)
Glucose-Capillary: 184 mg/dL — ABNORMAL HIGH (ref 65–99)

## 2017-05-14 LAB — COMPREHENSIVE METABOLIC PANEL
ALT: 15 U/L (ref 14–54)
AST: 16 U/L (ref 15–41)
Albumin: 2.7 g/dL — ABNORMAL LOW (ref 3.5–5.0)
Alkaline Phosphatase: 42 U/L (ref 38–126)
Anion gap: 6 (ref 5–15)
BUN: 59 mg/dL — AB (ref 6–20)
CHLORIDE: 110 mmol/L (ref 101–111)
CO2: 24 mmol/L (ref 22–32)
CREATININE: 2.21 mg/dL — AB (ref 0.44–1.00)
Calcium: 8.1 mg/dL — ABNORMAL LOW (ref 8.9–10.3)
GFR calc Af Amer: 23 mL/min — ABNORMAL LOW (ref >60)
GFR, EST NON AFRICAN AMERICAN: 20 mL/min — AB (ref >60)
GLUCOSE: 114 mg/dL — AB (ref 65–99)
Potassium: 4.2 mmol/L (ref 3.5–5.1)
Sodium: 140 mmol/L (ref 135–145)
Total Bilirubin: 0.7 mg/dL (ref 0.3–1.2)
Total Protein: 5.2 g/dL — ABNORMAL LOW (ref 6.5–8.1)

## 2017-05-14 LAB — LACTIC ACID, PLASMA: Lactic Acid, Venous: 0.9 mmol/L (ref 0.5–1.9)

## 2017-05-14 MED ORDER — DEXTROSE 50 % IV SOLN
INTRAVENOUS | Status: AC
  Administered 2017-05-14: 25 mL
  Filled 2017-05-14: qty 50

## 2017-05-14 MED ORDER — DEXTROSE-NACL 5-0.9 % IV SOLN
INTRAVENOUS | Status: DC
  Administered 2017-05-14 – 2017-05-15 (×3): via INTRAVENOUS

## 2017-05-14 MED ORDER — SODIUM CHLORIDE 0.9 % IV SOLN: INTRAVENOUS | Status: DC

## 2017-05-14 NOTE — Evaluation (Signed)
Occupational Therapy Evaluation Patient Details Name: [name] MRN: [id number] DOB: 1935-02-27 Today's Date: 05/14/2017    History of Present Illness 82 y.o. Female with steady functional decline over the past 12 months and in May she was placed in an assisted living facility. Pt was brought to the ER where she had a rectal temperature of 98.4, she had borderline hypotension with a BP of 97/36 with an MAP of 54. She was found to have an acute kidney injury. medical history significant for CAD catheterization 2011 with EF 35-40%, nonischemic cardiomyopathy with EF 25% based on 2011 echo, atrial fibrillation on anticoagulation, sick sinus syndrome status post pacemaker 2004, hypertension, dyslipidemia, diabetes on oral agents and dementia.    Clinical Impression   PTA, pt was living at [facility] memory care unit and was receiving assistance for ADLs. Pt's daughter reports that pt would participate in ADLs but requires cues. Currently, pt requires Max cues throughout session to participate in ADLs. Provided family with education on sensory integration to increase pt participation and comfort; family very appreicative. Pt family reporting they would like to talk with hospice to help facilitate dc planning and continuing therapy. Will continue to follow acutely to increase pt's occupational participation and facilitate safe dc until family decides on dc and therapy plans. Recommend dc SNF for post-acute care.    Follow Up Recommendations  SNF;Supervision/Assistance - 24 hour    Equipment Recommendations  Other (comment) (Defer to next venue)    Recommendations for Other Services       Precautions / Restrictions Precautions Precautions: Fall Restrictions Weight Bearing Restrictions: No      Mobility Bed Mobility Overal bed mobility: Needs Assistance Bed Mobility: Supine to Sit;Sit to Supine     Supine to sit: Mod assist;HOB elevated Sit to supine: Min assist   General bed  mobility comments: Mod A to transition BLE and hips with bed pad. Pt demosntrating good strength and ROM. However, requires Max cues to particiapte.   Transfers                 General transfer comment: Due to pt's decreased awareness and attention, did not attempt transfers at eval    Balance Overall balance assessment: Needs assistance Sitting-balance support: No upper extremity supported;Feet supported Sitting balance-Leahy Scale: Good Sitting balance - Comments: Pt able to maintain sitting balance at EOB for self feeding. However, distractability increases her risk of falls                                   ADL either performed or assessed with clinical judgement   ADL Overall ADL's : Needs assistance/impaired Eating/Feeding: Minimal assistance;Sitting Eating/Feeding Details (indicate cue type and reason): Pt sat EOB for ~3 min to drink ensure from straw. Pt able to hold drink and self feed, but because distracted and return to sidelying. Requires cues throughout. Pt also eats single piece of french toast without difficulty swallowing or chewing while supien in bed.  Grooming: Minimal assistance;Sitting   Upper Body Bathing: Moderate assistance;Sitting;Cueing for sequencing;Cueing for safety   Lower Body Bathing: Maximal assistance;Sit to/from stand;Cueing for sequencing;Cueing for safety   Upper Body Dressing : Moderate assistance;Sitting;Cueing for sequencing;Cueing for safety   Lower Body Dressing: Maximal assistance;Cueing for safety;Cueing for sequencing;Sit to/from Market researcher Details (indicate cue type and reason): Daughter reports that pt has been getting up to [facility] at bed  for urination and BMs. Pt requires assistance from NT/RN           General ADL Comments: Pt limited by decreased attention, orientation, and awareness. Educated family on sensory techniques to increase pt's occuaptional participation and comfort while resting.   Daughter reporting that they are unsure if they would liek to continue acute OT untill they speak with hospice care.      Vision         Perception     Praxis      Pertinent Vitals/Pain Pain Assessment: Faces Faces Pain Scale: Hurts little more Pain Location: generlized Pain Descriptors / Indicators: Constant;Aching Pain Intervention(s): Monitored during session;Limited activity within patient's tolerance;Repositioned     Hand Dominance Right   Extremity/Trunk Assessment Upper Extremity Assessment Upper Extremity Assessment: Overall WFL for tasks assessed   Lower Extremity Assessment Lower Extremity Assessment: Defer to PT evaluation   Cervical / Trunk Assessment Cervical / Trunk Assessment: Kyphotic   Communication Communication Communication: Other (comment) (Cognitive status. Baseline dementia)   Cognition Arousal/Alertness: Awake/alert Behavior During Therapy: Restless Overall Cognitive Status: History of cognitive impairments - at baseline Area of Impairment: Attention;Following commands;Safety/judgement;Awareness                   Current Attention Level: Focused   Following Commands: Follows one step commands inconsistently Safety/Judgement: Decreased awareness of deficits;Decreased awareness of safety Awareness: Intellectual   General Comments: Pt with baseline dementia. Daughter reports that pt's cogntitive is different than PTA reporting that she is more distractable. Pt present with decreased attention, problem solving and orientation. Pt is aware that she is not at home.    General Comments  Pt daughters present throughout session and provide living situation and PLOF. Discussed options for acute OT and daughters are very appreciative.  Family reports they want to talk with hospice.    Exercises     Shoulder Instructions      Home Living Family/patient expects to be discharged to:: Assisted living                              Home Equipment: Dan Humphreys - 2 wheels Aeronautical engineer)   Additional Comments: Memory care at [facility]      Prior Functioning/Environment Level of Independence: Needs assistance  Gait / Transfers Assistance Needed: Walk with rollator PTA ADL's / Homemaking Assistance Needed: Recieved assistance for ADLs. Daughter reports that she was participate but required cueing            OT Problem List: Decreased activity tolerance;Decreased cognition;Decreased safety awareness;Decreased knowledge of use of DME or AE;Decreased knowledge of precautions;Pain      OT Treatment/Interventions: Self-care/ADL training;Therapeutic exercise;Energy conservation;DME and/or AE instruction;Therapeutic activities;Patient/family education    OT Goals(Current goals can be found in the care plan section) Acute Rehab OT Goals Patient Stated Goal: Discuss dc plan with hospice OT Goal Formulation: With patient/family Time For Goal Achievement: 05/28/17 Potential to Achieve Goals: Good ADL Goals Pt Will Perform Eating: with min guard assist;sitting (Mod cues) Pt Will Perform Grooming: with min guard assist;sitting Pt Will Transfer to Toilet: with mod assist;bedside commode;stand pivot transfer Pt Will Perform Toileting - Clothing Manipulation and hygiene: with mod assist;sit to/from stand  OT Frequency: Min 2X/week   Barriers to D/C:            Co-evaluation              AM-PAC PT "6 Clicks" Daily Activity  Outcome Measure Help from another person eating meals?: A Little Help from another person taking care of personal grooming?: A Lot Help from another person toileting, which includes using toliet, bedpan, or urinal?: A Lot Help from another person bathing (including washing, rinsing, drying)?: A Lot Help from another person to put on and taking off regular upper body clothing?: A Lot Help from another person to put on and taking off regular lower body clothing?: A Lot 6 Click Score: 13   End  of Session Nurse Communication: Mobility status;Other (comment) (IV beeping. Family asking about hospice arrival)  Activity Tolerance: Other (comment);Treatment limited secondary to agitation (Limited by decreased cogntition) Patient left: in bed;with call bell/phone within reach;with family/visitor present;with bed alarm set  OT Visit Diagnosis: Pain;Other symptoms and signs involving cognitive function;Muscle weakness (generalized) (M62.81) Pain - Right/Left:  (Generalized) Pain - part of body:  (Generalized)                Time: 5366-4403 OT Time Calculation (min): 33 min Charges:  OT General Charges $OT Visit: 1 Procedure OT Evaluation $OT Eval Moderate Complexity: 1 Procedure OT Treatments $Self Care/Home Management : 8-22 mins G-Codes:     Zakee Deerman MSOT, OTR/L Acute Rehab Pager: [phone] Office: [phone]  Theodoro Grist Ethelyne Erich 05/14/2017, 11:05 AM

## 2017-05-14 NOTE — Consult Note (Signed)
Consultation Note Date: 05/14/2017   Patient Name: [name]  DOB: 1935/07/18  MRN: [id number]  Age / Sex: 82 y.o., female  PCP: Geoffry Paradise, MD Referring Physician: Merlene Laughter, DO  Reason for Consultation: Establishing goals of care, Hospice Evaluation and Psychosocial/spiritual support  HPI/Patient Profile: 82 y.o. female  with past medical history of Coronary artery disease with heart catheterization in 2011 with an EF of 35-40% (echocardiogram 2011 shows EF at 25%), nonischemic cardiomyopathy, atrial fib, sick sinus syndrome status post pacemaker in 2004, hypertension, dyslipidemia, diabetes, moderate to severe dementia with a progressive functional decline over the past 12 months admitted on 05/13/2017 with agitation, decreased oral intake. Upon admission patient had a lactic acid level of 5.9, acute kidney injury with a creatinine of 3.56. Chest x-ray was negative for acute processes, UA with moderate amount of leukocyte esterase. Patient was started on IV antibiotics  Consult was for goals of care in the setting of sepsis, moderate to severe dementia. Patient is more alert today, agitated (family at bedside shares that this is her baseline). With fluids her creatinine has improved to 2.21, white blood cell count now normal at 6.4. Patient is eating bites and sips. Per chart review, family is considering hospice or if she were to improve, a skilled nursing level of care versus memory care  Clinical Assessment and Goals of Care: Went to see patient and family for goals of care discussion. Patient has moderate to severe dementia, is agitated and unable to participate in assessment. She is oriented only to herself. Her son-in-law and grandson are at the bedside. They state she "looks much better than yesterday", they also state the level of agitation we are seeing in the hospital, as well as confusion  is her baseline. Prior to hospitalization patient would some days be able to walk with a walker and has been steadily eating and drinking less.   Patient has 2 daughters who are her healthcare proxy now that she has advanced dementia [name] [phone] [name] [phone]    SUMMARY OF RECOMMENDATIONS   DO NOT RESUSCITATE/ DO NOT INTUBATE Appointment scheduled with daughters for 05/15/2017 at 10 AM to address issues and goals relevant to patient and family. Per patient's son-in-law and grandson at the bedside, one of family's concerns is that patient is only eating bites and sips. They have questions regarding artificial nutrition and feeding tubes. I did briefly discuss with family at the bedside that with the degree of dementia the patient has, feeding tubes are not recommended, they do not improve neurological status, nor do they prevent aspiration. Additionally we discussed given patient's baseline level of agitation, the very real possibility  that she would pull this out, or find herself in restraints to protect a feeding tube Code Status/Advance Care Planning:  DNR    Symptom Management:   Agitation: At this point psychotropic medications appear to be on hold. Patient's baseline agitation appears severe enough to consider atypical neuroleptics such as Zyprexa 2.5-5 mg daily at bedtime. This  class of medications does have a black box warning for treatment in dementia  patients, however patient's agitation, lack of appetite, the benefits may outweigh the risks  Palliative Prophylaxis:   Aspiration, Bowel Regimen, Delirium Protocol, Frequent Pain Assessment, Oral Care and Turn Reposition   Psycho-social/Spiritual:   Desire for further Chaplaincy support:no  Additional Recommendations: Referral to Community Resources   Prognosis:   < 6 months in the setting of moderate to severe dementia, coronary artery disease, cardiomyopathy, atrial fib, sick sinus syndrome  with an ejection fraction of 25%, acute on chronic kidney disease. Patient has an albumin of 2.7 and likely would qualify for her in home hospice Medicare benefit. She would not be able to elect her hospice benefit if the patient and family goals are for skilled nursing facility utilizing skilled days/rehabilitation  Discharge Planning: TBD     Primary Diagnoses: Present on Admission: . Sepsis (HCC) . Severe dementia . Acute kidney injury (HCC) . Type 2 diabetes mellitus, uncontrolled (HCC) . Coronary artery disease . Anemia . Takotsubo cardiomyopathy . HTN (hypertension)   I have reviewed the medical record, interviewed the patient and family, and examined the patient. The following aspects are pertinent.  Past Medical History:  Diagnosis Date  . ANXIETY   . Arthritis   . Atrial fibrillation (HCC)    on coumadin  . CARDIOMYOPATHY    Nonischemic; nonobstructive CAD, EF 35-40% by cath 07/2010, EF 25% by echo 07/2010  . CHF   . Coronary artery disease    non obstructive  . DEMENTIA   . Depression   . Diabetes mellitus   . GERD   . HYPERCHOLESTEROLEMIA   . Hypertension   . Myocardial infarction (HCC)   . Pacemaker    Medtronic  . Shortness of breath   . SICK SINUS SYNDROME    s/p Medtronic PPM '04, generator change '12  . Takotsubo cardiomyopathy    2006   Social History   Social History  . Marital status: Widowed    Spouse name: N/A  . Number of children: N/A  . Years of education: N/A   Social History Main Topics  . Smoking status: Never Smoker  . Smokeless tobacco: Never Used  . Alcohol use No  . Drug use: No  . Sexual activity: No   Other Topics Concern  . Not on file   Social History Narrative  . No narrative on file   Family History  Problem Relation Age of Onset  . Other Unknown        no known CAD   Scheduled Meds: . ALPRAZolam  0.25 mg Oral BID  . apixaban  2.5 mg Oral BID  . Chlorhexidine Gluconate Cloth  6 each Topical Q0600  .  escitalopram  10 mg Oral Daily  . insulin aspart  0-5 Units Subcutaneous QHS  . insulin aspart  0-9 Units Subcutaneous TID WC  . mupirocin ointment  1 application Nasal BID  . QUEtiapine  25 mg Oral BID  . sodium chloride flush  3 mL Intravenous Q12H   Continuous Infusions: . ceFEPime (MAXIPIME) IV    . dextrose 5 % and 0.9% NaCl 75 mL/hr at 05/14/17 0819   PRN Meds:.acetaminophen **OR** acetaminophen, LORazepam, morphine injection, ondansetron **OR** ondansetron (ZOFRAN) IV Medications Prior to Admission:  Prior to Admission medications   Medication Sig Start Date End Date Taking? Authorizing Provider  acetaminophen (TYLENOL) 500 MG tablet Take 1 tablet (500 mg total) by mouth every 6 (six) hours as needed  for mild pain or moderate pain. 06/18/16  Yes Everlene Farrier, PA-C  ALPRAZolam Prudy Feeler) 0.5 MG tablet Take 0.25 mg by mouth 2 (two) times daily.    Yes [provider]  alum & mag hydroxide-simeth (MINTOX) 200-200-20 MG/5ML suspension Take 30 mLs by mouth every 6 (six) hours as needed for indigestion or heartburn.   Yes [provider]  apixaban (ELIQUIS) 2.5 MG TABS tablet Take 1 tablet (2.5 mg total) by mouth 2 (two) times daily. 02/21/17  Yes Wendall Stade, MD  benazepril (LOTENSIN) 20 MG tablet TAKE 1/2 TABLET BY MOUTH DAILY 01/06/17  Yes Wendall Stade, MD  benazepril (LOTENSIN) 20 MG tablet Take 20 mg by mouth daily.   Yes [provider]  divalproex (DEPAKOTE SPRINKLE) 125 MG capsule Take 125-250 mg by mouth 3 (three) times daily. 250 mg at 8 am. 125 mg at 2 pm. 250 mg at 8 pm   Yes [provider]  escitalopram (LEXAPRO) 10 MG tablet Take 10 mg by mouth daily.   Yes [provider]  furosemide (LASIX) 40 MG tablet TAKE 1/2 TABLET BY MOUTH IN THE MORNING AND 1 TABLET IN THE EVENING 03/22/17  Yes Wendall Stade, MD  glipiZIDE (GLUCOTROL) 5 MG tablet Take 5 mg by mouth 2 (two) times daily before a meal.  12/19/13  Yes [provider]   guaifenesin (ROBITUSSIN) 100 MG/5ML syrup Take 200 mg by mouth 4 (four) times daily as needed for cough.   Yes [provider]  loperamide (IMODIUM) 2 MG capsule Take 2 mg by mouth as needed for diarrhea or loose stools.   Yes [provider]  magnesium hydroxide (MILK OF MAGNESIA) 400 MG/5ML suspension Take 30 mLs by mouth at bedtime as needed for mild constipation.   Yes [provider]  metFORMIN (GLUCOPHAGE) 500 MG tablet Take 500 mg by mouth 2 (two) times daily with a meal.  12/19/13  Yes [provider]  metoprolol succinate (TOPROL-XL) 50 MG 24 hr tablet TAKE 1 TABLET BY MOUTH ONCE DAILY TAKE WITH OR IMMMEDIATELY FOLLOWINGA MEAL 01/06/17  Yes Wendall Stade, MD  neomycin-bacitracin-polymyxin (NEOSPORIN) ointment Apply 1 application topically as needed for wound care. apply to eye   Yes [provider]  nystatin (NYSTATIN) powder Apply topically 2 (two) times daily. For 14 days   Yes [provider]  QUEtiapine (SEROQUEL) 25 MG tablet Take 25 mg by mouth 2 (two) times daily.   Yes [provider]  spironolactone (ALDACTONE) 25 MG tablet Take 0.5 tablets (12.5 mg total) by mouth daily. 01/02/17  Yes Wendall Stade, MD  nitroGLYCERIN (NITROSTAT) 0.4 MG SL tablet Place 1 tablet (0.4 mg total) under the tongue every 5 (five) minutes as needed. For chest pain 02/21/17   Wendall Stade, MD   Allergies  Allergen Reactions  . Penicillins Rash    Happened many years ago Has patient had a PCN reaction causing immediate rash, facial/tongue/throat swelling, SOB or lightheadedness with hypotension: Yes Has patient had a PCN reaction causing severe rash involving mucus membranes or skin necrosis: Unknown Has patient had a PCN reaction that required hospitalization Unknown Has patient had a PCN reaction occurring within the last 10 years: No If all of the above answers are "NO", then may proceed with Cephalosporin use.    Review of Systems    Unable to perform ROS: Dementia    Physical Exam  Constitutional: She appears well-developed and well-nourished.  Elderly female, confused, agitated  Neck: Normal range of motion.  Cardiovascular: Normal rate.   Pulmonary/Chest: Effort normal.  Abdominal: Soft.  Neurological: She is alert.  Oriented to self only Unable to follow commands  Psychiatric:  Agitated  Nursing note and vitals reviewed.   Vital Signs: BP (!) 114/44 (BP Location: Left Arm)   Pulse 62   Temp 98.5 F (36.9 C) (Oral)   Resp 18   Ht 5\' 5"  (1.651 m)   Wt 72.6 kg (160 lb)   SpO2 98%   BMI 26.63 kg/m  Pain Assessment: No/denies pain       SpO2: SpO2: 98 % O2 Device:SpO2: 98 % O2 Flow Rate: .   IO: Intake/output summary:  Intake/Output Summary (Last 24 hours) at 05/14/17 1402 Last data filed at 05/14/17 1400  Gross per 24 hour  Intake          3320.83 ml  Output             1750 ml  Net          1570.83 ml    LBM: Last BM Date:  (PTA) Baseline Weight: Weight: 72.6 kg (160 lb) (Obtained from [facility] on 04/17/17) Most recent weight: Weight: 72.6 kg (160 lb)     Palliative Assessment/Data:   Flowsheet Rows     Most Recent Value  Intake Tab  Referral Department  Hospitalist  Unit at Time of Referral  Med/Surg Unit  Palliative Care Primary Diagnosis  Sepsis/Infectious Disease  Date Notified  05/13/17  Palliative Care Type  New Palliative care  Reason for referral  Clarify Goals of Care, Advance Care Planning, Counsel Regarding Hospice  Date of Admission  05/13/17  Date first seen by Palliative Care  05/14/17  # of days Palliative referral response time  1 Day(s)  # of days IP prior to Palliative referral  0  Clinical Assessment  Palliative Performance Scale Score  30%  Pain Max last 24 hours  Not able to report  Pain Min Last 24 hours  Not able to report  Dyspnea Max Last 24 Hours  Not able to report  Dyspnea Min Last 24 hours  Not able to report  Nausea Max Last 24 Hours  Not  able to report  Nausea Min Last 24 Hours  Not able to report  Anxiety Max Last 24 Hours  Not able to report  Anxiety Min Last 24 Hours  Not able to report  Other Max Last 24 Hours  Not able to report  Psychosocial & Spiritual Assessment  Palliative Care Outcomes  Patient/Family meeting held?  No  Palliative Care follow-up planned  Yes, Facility      Time In: 1330 Time Out: 1423 Time Total: 53 min Greater than 50%  of this time was spent counseling and coordinating care related to the above assessment and plan.  Signed by: Irean Hong, NP   Please contact Palliative Medicine Team phone at [phone] for questions and concerns.  For individual provider: See Loretha Stapler

## 2017-05-14 NOTE — Progress Notes (Signed)
Hypoglycemic Event  CBG: 53  Treatment: D50 IV 25 mL  Symptoms: None  Follow-up CBG: [id number] CBG Result:109  Possible Reasons for Event: Inadequate meal intake  Comments/MD notified:IV fluids changed to dextrose 5%-0.9% sodium chloride at 75 ml/ hr.    Loleta Rose

## 2017-05-14 NOTE — Progress Notes (Signed)
Hypoglycemic Event  CBG: 45 at 2119  Treatment: 15 GM carbohydrate snack  Symptoms: None  Follow-up CBG: Time:2158 CBG Result:58  Treatment: Dextrose 25 mL  Symptoms: None  Follow-up CBG: Time: 2230      CBG Result: 126   Possible Reasons for Event: Inadequate meal intake    Comments/MD notified Continue to monitor    [name]

## 2017-05-14 NOTE — Progress Notes (Signed)
PROGRESS NOTE    [name]  ZOX:[id number] DOB: [birth year] DOA: 05/13/2017 PCP: Geoffry Paradise, MD   Brief Narrative:  [name] is a 82 y.o. female with medical history significant for CAD catheterization 2011 with EF 35-40%, nonischemic cardiomyopathy with EF 25% based on 2011 echo, atrial fibrillation on anticoagulation, sick sinus syndrome status post pacemaker 2004, hypertension, dyslipidemia, diabetes on oral agents and dementia. Patient has had steady functional decline over the past 12 months and in May she was placed in an assisted living facility. She has lifelong issues with anxiety and mild agitation which has been further worsened by her underlying dementia diagnosis. The family reports that for months she has had poor oral intake including her favorite foods. Patient was at her baseline mentation on Thursday but over the next several days had rapid decline became more agitated. She was brought to the ER where she had a rectal temperature of 98.4, she had borderline hypotension with a BP of 97/36 with an MAP of 54. She was found to have an acute kidney injury with a BUN of 73 and a creatinine of 3.56, glucose elevated at 310, lactic acid elevated at 5.9 to, white count elevated at 13,600 neutrophils 80% and absolute neutrophils 10.9%. Chest x-ray without evidence of pneumonia. EDP has opted to treat as sepsis of unknown source so has begun broad-spectrum antibiotics. Palliative Care was consulted as family is concerned about recent decline and poor po intake. Patient's numbers are clinically improving and patient's family to have Family meeting with Palliative Care Medicine in AM.  Assessment & Plan:   Principal Problem:   Sepsis (HCC) Active Problems:   Severe dementia   Acute kidney injury (HCC)   Type 2 diabetes mellitus, uncontrolled (HCC)   Coronary artery disease   Anemia   Takotsubo cardiomyopathy   HTN (hypertension)  Sepsis With unclear Etiology  -Patient  presents with acute altered mentation as evidenced by agitation with findings of leukocytosis with left shift, borderline hypothermia, acute kidney injury, and markedly elevated lactic acid of 5.92 along with Tachycardia, Tachypenia, and concerning for sepsis physiology -Abnormal physiology could be explained by severe dehydration with acidemia secondary to offending medications (Lasix, metformin, ACE inhibitor) -Given 3 Liters of IV Boluses in ED; Now on Maintence IVF with D5W+NS at 75 mL/hr -Urinalysis abnormal so potential source for infection; Urine Cx Pending  -Broad spectrum Abx with IV Cefepime  -Procalcitonin -LA as below -WBC went from 13.6 -> 6.4 -Follow up on blood cultures and urine culture; Blood Cx Show NGTD <24 Hours and Urine Cx pending -No plans to escalate care and if patient worsened or not improving in 24 hours consideration will be given to de-escalating care and focusing on comfort (see below) -Palliative Care Medicine Consulted   AKI -Baseline renal function: 27 and 1.05 in June -Admission renal function: 73 and 3.56 -Patient's BUN/Cr went from 73/3.56 -> 59/2.21 -Multifactorial etiology: Poor oral intake and setting of continued administration of Lasix and ACE inhibitor as well as metformin -Hold offending medications including Lasix and ACE -IV fluid hydration at 100/hr changed to D5W NS at 75 mL/hr -Acidemia could be 2/2 acute kidney injury alone -Continue to Monitor Renal Fxn and Repeat CMP in AM  Lactic Acidosis -Patient's Lactic Acidosis went from 5.92 -> 0.9 -C/w IVF as Above  Severe Dementia with Agitation  -Patient with progressive Failure to thrive over the past year and has recently been placed in a memory care ALF -Patient may be ready  to transition to a SNF -PT/OT evaluation -Currently DO NOT RESUSCITATE but family needs assistance in evaluating end-of-life GOC and navigating the MOST form especially regarding decisions about when appropriate to treat  and not treat -Given response to above treatments, patient may be eligible for residential hospice -Palliative Care Consulted for Goals of Care and will have Family Meeting on 05/15/17 -C/w Escitalopram 10 mg po Daily, Alprazolam 0.25 mg po BID, and Quetiapine 25 mg po BID -Per Palliative may need Atypical Neuroleptics such as Zyprexa 2.5-5 mg poDaily qHS but risks   Normocytic Anemia -Baseline hemoglobin 11 -Hb/Hct went from 8.5/26.6 -> 8.4/25.6 -No S/Sx of Bleeding per Family  -Repeat CBC in AM  Takotsubo cardiomyopathy/NICM -Last Echocardiogram completed 2011 with an EF of 25% and has not undergone follow-up echocardiogram since -Patient on beta blocker, ACE inhibitor and Lasix prior to admission -Currently with acute kidney injury so ACE inhibitor as well as Lasix on hold -Given failure to thrive and poor oral intake may need to possibly discontinue ACE inhibitor and Lasix -C/w   Type 2 Diabetes Mellitus, uncontrolled -Current CBG uncontrolled greater than 300 and could be reflective of underlying infectious process noting this is not typical for patient and she has apparently had poor oral intake and possible weight loss -HgbA1c -Hold Glucotrol and Metformin -Given poor oral intake and risks for additional dehydration state and associated acute kidney injury would not resume metformin or Glucotrol and instead utilize SSI after discharge -Patient on D5W+NS at 75 mL/hr given Hypoglycemia overnight -C/w Sensitive Novolog SSI AC/HS  Coronary Artery Disease -Currently asymptomatic -Last saw primary Cardiologist Dr. [name] December 2017 -ACE and BB held currently   SSS/AF -100% ventricular paced -C/w Apixaban 2.5 mg po BID due to Renal Insufficiency  -Beta blocker on hold secondary to suboptimal blood pressure readings  HTN (hypertension) -Home medications on hold secondary to suboptimal blood pressure readings and dehydration  Poor Po Intake -Palliative Care Medicine to  meet with the Family to discuss Goals of Care in AM  DVT prophylaxis: Anticoagulated with Apixaban  Code Status: DO NOT RESUSCITATE Family Communication: Discussed with Daugther and rest of family at bedside Disposition Plan: Pending further evaluation. PT/OT to Evaluate  Consultants:   Palliative Care Medicine   Procedures: None   Antimicrobials: Anti-infectives    Start     Dose/Rate Route Frequency Ordered Stop   05/14/17 1500  ceFEPIme (MAXIPIME) 1 g in dextrose 5 % 50 mL IVPB     1 g 100 mL/hr over 30 Minutes Intravenous Every 24 hours 05/13/17 1534     05/13/17 1430  ceFEPIme (MAXIPIME) 2 g in dextrose 5 % 50 mL IVPB     2 g 100 mL/hr over 30 Minutes Intravenous  Once 05/13/17 1424 05/13/17 1543     Subjective: Seen and examined at bedside and patient unable to provide subjective hx due to dementia but was agitated. Per family patient has been refusing po intake. No CP or SOB. Denied any other complaints or concerns.   Objective: Vitals:   05/13/17 1752 05/13/17 1758 05/13/17 2120 05/14/17 0526  BP:  (!) 95/39 (!) 100/43 (!) 114/44  Pulse:  64 64 62  Resp:  17 18 18   Temp:  98.6 F (37 C) 98.3 F (36.8 C) 98.5 F (36.9 C)  TempSrc:  Oral Oral Oral  SpO2:  100% 91% 98%  Weight: 72.6 kg (160 lb)     Height: 5\' 5"  (1.651 m)       Intake/Output  Summary (Last 24 hours) at 05/14/17 9604 Last data filed at 05/14/17 5409  Gross per 24 hour  Intake          3320.83 ml  Output              900 ml  Net          2420.83 ml   Filed Weights   05/13/17 1419 05/13/17 1752  Weight: 72.6 kg (160 lb) 72.6 kg (160 lb)   Examination: Physical Exam:  Constitutional: WN/WD demented elderly Caucasian female NAD and appears slightly agitated Eyes: Lids and conjunctivae normal, sclerae anicteric  ENMT: External Ears, Nose appear normal. Grossly normal hearing. Mucous membranes are slightly dry. Neck: Appears normal, supple, no cervical masses, normal ROM, no appreciable  thyromegaly, no JVD Respiratory: Clear to auscultation bilaterally, no wheezing, rales, rhonchi or crackles. Normal respiratory effort and patient is not tachypenic. No accessory muscle use.  Cardiovascular: RRR, no murmurs / rubs / gallops. S1 and S2 auscultated. Trace extremity edema.  Abdomen: Soft, non-tender, non-distended. No masses palpated. No appreciable hepatosplenomegaly. Bowel sounds positive.  GU: Deferred. Musculoskeletal: No clubbing / cyanosis of digits/nails. No joint deformity upper and lower extremities. Good ROM, no contractures.  Skin: No rashes, lesions, ulcers on a limited skin evaluation. No induration; Warm and dry.  Neurologic: CN 2-12 grossly intact with no focal deficits.  Romberg sign cerebellar reflexes not assessed.  Psychiatric: Impaired judgment and insight. Alert and oriented only to name; Thinks she is in [place]. Slightly agitated mood and appropriate affect.   Data Reviewed: I have personally reviewed following labs and imaging studies  CBC:  Recent Labs Lab 05/13/17 1413 05/14/17 0448  WBC 13.6* 6.4  NEUTROABS 10.9*  --   HGB 8.5* 8.4*  HCT 26.6* 25.6*  MCV 95.7 95.9  PLT 289 142*   Basic Metabolic Panel:  Recent Labs Lab 05/13/17 1400 05/13/17 1413 05/14/17 0448  NA  --  138 140  K  --  5.1 4.2  CL  --  97* 110  CO2  --  25 24  GLUCOSE  --  310* 114*  BUN  --  73* 59*  CREATININE  --  3.56* 2.21*  CALCIUM  --  9.4 8.1*  MG 1.9  --   --    GFR: Estimated Creatinine Clearance: 20.3 mL/min (A) (by C-G formula based on SCr of 2.21 mg/dL (H)). Liver Function Tests:  Recent Labs Lab 05/13/17 1413 05/14/17 0448  AST 31 16  ALT 25 15  ALKPHOS 67 42  BILITOT 0.6 0.7  PROT 7.2 5.2*  ALBUMIN 3.6 2.7*   No results for input(s): LIPASE, AMYLASE in the last 168 hours. No results for input(s): AMMONIA in the last 168 hours. Coagulation Profile:  Recent Labs Lab 05/13/17 1548  INR 1.38   Cardiac Enzymes: No results for  input(s): CKTOTAL, CKMB, CKMBINDEX, TROPONINI in the last 168 hours. BNP (last 3 results) No results for input(s): PROBNP in the last 8760 hours. HbA1C: No results for input(s): HGBA1C in the last 72 hours. CBG:  Recent Labs Lab 05/14/17 0417 05/14/17 0452 05/14/17 0522 05/14/17 0745 05/14/17 0813  GLUCAP 53* 109* 108* 62* 69   Lipid Profile: No results for input(s): CHOL, HDL, LDLCALC, TRIG, CHOLHDL, LDLDIRECT in the last 72 hours. Thyroid Function Tests: No results for input(s): TSH, T4TOTAL, FREET4, T3FREE, THYROIDAB in the last 72 hours. Anemia Panel: No results for input(s): VITAMINB12, FOLATE, FERRITIN, TIBC, IRON, RETICCTPCT in the last 72 hours.  Sepsis Labs:  Recent Labs Lab 05/13/17 1434 05/13/17 1945 05/14/17 0448  LATICACIDVEN 5.92* 2.8* 0.9    Recent Results (from the past 240 hour(s))  MRSA PCR Screening     Status: Abnormal   Collection Time: 05/13/17 10:11 PM  Result Value Ref Range Status   MRSA by PCR POSITIVE (A) NEGATIVE Final    Comment:        The GeneXpert MRSA Assay (FDA approved for NASAL specimens only), is one component of a comprehensive MRSA colonization surveillance program. It is not intended to diagnose MRSA infection nor to guide or monitor treatment for MRSA infections. RESULT CALLED TO, READ BACK BY AND VERIFIED WITH: RN Charlyn Minerva [id number] @2350  [facility]     Radiology Studies: Ct Head Wo Contrast  Result Date: 05/13/2017 CLINICAL DATA:  increased AMS. Per facility staff, pt "is acting different than normal" and has increased sugar Copied from ER Triage note * Patient scanned x 2 with immobilization. Did not hold still EXAM: CT HEAD WITHOUT CONTRAST TECHNIQUE: Contiguous axial images were obtained from the base of the skull through the vertex without intravenous contrast. COMPARISON:  03/12/2017 FINDINGS: Brain: There is central and cortical atrophy. Periventricular white matter changes are consistent with small vessel disease.  There small lacunar infarcts of the basal ganglia bilaterally. There is no intra or extra-axial fluid collection or mass lesion. The basilar cisterns and ventricles have a normal appearance. There is no CT evidence for acute infarction or hemorrhage. Vascular: There is atherosclerotic calcification of the carotid siphons. Skull: Normal. Negative for fracture or focal lesion. Sinuses/Orbits: No acute finding. Other: Study quality is degraded by patient motion artifact. IMPRESSION: 1. Significant atrophy and small vessel disease. 2. Remote lacunar infarcts of the basal ganglia. 3.  No evidence for acute intracranial abnormality. Electronically Signed   By: Norva Pavlov M.D.   On: 05/13/2017 14:45   Dg Chest Port 1 View  Result Date: 05/13/2017 CLINICAL DATA:  82 year old female with altered mental status EXAM: PORTABLE CHEST 1 VIEW COMPARISON:  Prior chest x-ray 07/02/2015 FINDINGS: Left subclavian approach cardiac rhythm maintenance device. Leads project over the right atrium and right ventricle. Stable cardiomegaly. Atherosclerotic calcifications in the transverse aorta. No patchy air scratch then no new focal airspace opacity, pulmonary edema, pleural effusion or pneumothorax. Stable mild bronchitic changes. Incompletely imaged anterior cervical stabilization hardware. IMPRESSION: No active disease. Electronically Signed   By: Malachy Moan M.D.   On: 05/13/2017 14:26   Scheduled Meds: . ALPRAZolam  0.25 mg Oral BID  . apixaban  2.5 mg Oral BID  . Chlorhexidine Gluconate Cloth  6 each Topical Q0600  . escitalopram  10 mg Oral Daily  . insulin aspart  0-5 Units Subcutaneous QHS  . insulin aspart  0-9 Units Subcutaneous TID WC  . mupirocin ointment  1 application Nasal BID  . QUEtiapine  25 mg Oral BID  . sodium chloride flush  3 mL Intravenous Q12H   Continuous Infusions: . ceFEPime (MAXIPIME) IV    . dextrose 5 % and 0.9% NaCl 75 mL/hr at 05/14/17 0819    LOS: 1 day   Merlene Laughter, DO [facility] Pager [phone]  If 7PM-7AM, please contact night-coverage [email or web address] Password [email or web address] 05/14/2017, 8:28 AM

## 2017-05-14 NOTE — Plan of Care (Signed)
Problem: Safety: Goal: Ability to remain free from injury will improve Outcome: Progressing Pt will be free from falls and injuries during this hospitalization.  Problem: Fluid Volume: Goal: Hemodynamic stability will improve Outcome: Progressing Pt's signs and symptoms of sepsis will be began to resolve prior to discharge.  Problem: Education: Goal: Knowledge of disease and its progression will improve Outcome: Progressing Pt's signs and symptoms of acute renal failure will began to resolve prior to discharge.

## 2017-05-14 NOTE — Progress Notes (Signed)
Blood sugar is now 95. Will cont to monitor pt and update MD [facility] when needed.

## 2017-05-14 NOTE — Progress Notes (Signed)
PT Cancellation Note  Patient Details Name: [name] MRN: [id number] DOB: October 07, 1935   Cancelled Treatment:    Reason Eval/Treat Not Completed: Medical issues which prohibited therapy.  Patient agitated today during OT session.  Pall Care to meet with daughters tomorrow.  Will return for PT evaluation following this meeting if appropriate for patient.  Thank you.   Vena Austria 05/14/2017, 4:46 PM Durenda Hurt. Renaldo Fiddler, [facility] Acute Rehab Services Pager [phone]

## 2017-05-14 NOTE — Progress Notes (Signed)
Hypoglycemic Event  CBG: 62  Treatment: 15 GM carbohydrate snack  Symptoms: None  Follow-up CBG: Time:0800 CBG Result:69  Possible Reasons for Event: Inadequate meal intake  Comments/MD notified:SHEIKH    [employer]

## 2017-05-15 DIAGNOSIS — R638 Other symptoms and signs concerning food and fluid intake: Secondary | ICD-10-CM

## 2017-05-15 LAB — GLUCOSE, CAPILLARY
GLUCOSE-CAPILLARY: 150 mg/dL — AB (ref 65–99)
Glucose-Capillary: 145 mg/dL — ABNORMAL HIGH (ref 65–99)
Glucose-Capillary: 148 mg/dL — ABNORMAL HIGH (ref 65–99)
Glucose-Capillary: 152 mg/dL — ABNORMAL HIGH (ref 65–99)
Glucose-Capillary: 182 mg/dL — ABNORMAL HIGH (ref 65–99)
Glucose-Capillary: 208 mg/dL — ABNORMAL HIGH (ref 65–99)

## 2017-05-15 LAB — COMPREHENSIVE METABOLIC PANEL
ALBUMIN: 2.9 g/dL — AB (ref 3.5–5.0)
ALK PHOS: 47 U/L (ref 38–126)
ALT: 15 U/L (ref 14–54)
Anion gap: 8 (ref 5–15)
BILIRUBIN TOTAL: 0.7 mg/dL (ref 0.3–1.2)
BUN: 32 mg/dL — AB (ref 6–20)
CALCIUM: 8.7 mg/dL — AB (ref 8.9–10.3)
CO2: 23 mmol/L (ref 22–32)
Chloride: 112 mmol/L — ABNORMAL HIGH (ref 101–111)
Creatinine, Ser: 1.48 mg/dL — ABNORMAL HIGH (ref 0.44–1.00)
GFR calc Af Amer: 37 mL/min — ABNORMAL LOW (ref >60)
GFR calc non Af Amer: 32 mL/min — ABNORMAL LOW (ref >60)
GLUCOSE: 155 mg/dL — AB (ref 65–99)
Potassium: 3.9 mmol/L (ref 3.5–5.1)
Sodium: 143 mmol/L (ref 135–145)
TOTAL PROTEIN: 6.1 g/dL — AB (ref 6.5–8.1)

## 2017-05-15 LAB — HEMOGLOBIN A1C
HEMOGLOBIN A1C: 5.4 % (ref 4.8–5.6)
Mean Plasma Glucose: 108 mg/dL

## 2017-05-15 LAB — CBC WITH DIFFERENTIAL/PLATELET
BASOS ABS: 0 10*3/uL (ref 0.0–0.1)
BASOS PCT: 0 %
Eosinophils Absolute: 0 10*3/uL (ref 0.0–0.7)
Eosinophils Relative: 0 %
HEMATOCRIT: 27.8 % — AB (ref 36.0–46.0)
HEMOGLOBIN: 9.1 g/dL — AB (ref 12.0–15.0)
Lymphocytes Relative: 15 %
Lymphs Abs: 1.2 10*3/uL (ref 0.7–4.0)
MCH: 31 pg (ref 26.0–34.0)
MCHC: 32.7 g/dL (ref 30.0–36.0)
MCV: 94.6 fL (ref 78.0–100.0)
Monocytes Absolute: 1.1 10*3/uL — ABNORMAL HIGH (ref 0.1–1.0)
Monocytes Relative: 14 %
NEUTROS PCT: 71 %
Neutro Abs: 5.6 10*3/uL (ref 1.7–7.7)
Platelets: 157 10*3/uL (ref 150–400)
RBC: 2.94 MIL/uL — AB (ref 3.87–5.11)
RDW: 13.9 % (ref 11.5–15.5)
WBC: 8 10*3/uL (ref 4.0–10.5)

## 2017-05-15 LAB — PHOSPHORUS: Phosphorus: 2 mg/dL — ABNORMAL LOW (ref 2.5–4.6)

## 2017-05-15 LAB — MAGNESIUM: Magnesium: 1.4 mg/dL — ABNORMAL LOW (ref 1.7–2.4)

## 2017-05-15 MED ORDER — KCL IN DEXTROSE-NACL 20-5-0.45 MEQ/L-%-% IV SOLN
INTRAVENOUS | Status: DC
  Administered 2017-05-15 – 2017-05-16 (×2): via INTRAVENOUS
  Filled 2017-05-15 (×3): qty 1000

## 2017-05-15 MED ORDER — MORPHINE SULFATE (CONCENTRATE) 10 MG/0.5ML PO SOLN
5.0000 mg | ORAL | Status: DC | PRN
  Administered 2017-05-16 (×2): 5 mg via ORAL
  Filled 2017-05-15 (×2): qty 0.5

## 2017-05-15 MED ORDER — GLYCOPYRROLATE 0.2 MG/ML IJ SOLN: 0.2000 mg | INTRAMUSCULAR | Status: DC | PRN

## 2017-05-15 MED ORDER — TRAZODONE HCL 50 MG PO TABS
25.0000 mg | ORAL_TABLET | Freq: Every day | ORAL | Status: DC
  Administered 2017-05-15: 25 mg via ORAL
  Filled 2017-05-15: qty 1

## 2017-05-15 MED ORDER — TRAZODONE HCL 50 MG PO TABS: 25.0000 mg | ORAL_TABLET | Freq: Every day | ORAL | Status: DC

## 2017-05-15 MED ORDER — ALPRAZOLAM 0.5 MG PO TABS
0.5000 mg | ORAL_TABLET | Freq: Two times a day (BID) | ORAL | Status: DC
  Administered 2017-05-15: 0.5 mg via ORAL
  Filled 2017-05-15 (×2): qty 1

## 2017-05-15 NOTE — Plan of Care (Signed)
Problem: Safety: Goal: Ability to remain free from injury will improve Outcome: Progressing Pt will remain free from falls and injuries during this hospitalization.  Problem: Physical Regulation: Goal: Signs and symptoms of infection will decrease Outcome: Progressing Pt will be free from signs and symptoms of sepsis prior to discharge.  Problem: Physical Regulation: Goal: Complications related to the disease process or treatment will be avoided or minimized Outcome: Progressing Pt will be free from signs and symptoms of acute kidney injury prior to discharge.

## 2017-05-15 NOTE — Progress Notes (Signed)
PROGRESS NOTE    [name]  UJW:[id number] DOB: 1935/10/05 DOA: 05/13/2017 PCP: Burnard Bunting, MD   Brief Narrative:  [name] is a 82 y.o. female with medical history significant for CAD catheterization 2011 with EF 35-40%, nonischemic cardiomyopathy with EF 25% based on 2011 echo, atrial fibrillation on anticoagulation, sick sinus syndrome status post pacemaker 2004, hypertension, dyslipidemia, diabetes on oral agents and dementia. Patient has had steady functional decline over the past 12 months and in May she was placed in an assisted living facility. She has lifelong issues with anxiety and mild agitation which has been further worsened by her underlying dementia diagnosis. The family reports that for months she has had poor oral intake including her favorite foods. Patient was at her baseline mentation on Thursday but over the next several days had rapid decline became more agitated. She was brought to the ER where she had a rectal temperature of 98.4, she had borderline hypotension with a BP of 97/36 with an MAP of 54. She was found to have an acute kidney injury with a BUN of 73 and a creatinine of 3.56, glucose elevated at 310, lactic acid elevated at 5.9 to, white count elevated at 13,600 neutrophils 80% and absolute neutrophils 10.9%. Chest x-ray without evidence of pneumonia. EDP has opted to treat as sepsis of unknown source so has begun broad-spectrum antibiotics. Palliative Care was consulted as family is concerned about recent decline and poor po intake. Patient's numbers are clinically improving and patient's family to have Family met with Palliative Care this AM. After extensive discussion family states that because she has such a poor po intake and has been refusing to eat they feel like a feeding tube will not fix her dementia and they want her to be comfortable and have elected to proceed with Hospice facility placement. Patient's family is hopeful for [facility] placement  in AM and focus is now [place].   Assessment & Plan:   Principal Problem:   Sepsis ([place]) Active Problems:   Severe dementia   Acute kidney injury ([place])   Type 2 diabetes mellitus, uncontrolled ([place])   Coronary artery disease   Anemia   Takotsubo cardiomyopathy   HTN (hypertension)   Palliative care by specialist  Sepsis With unclear Etiology  -Patient presented with acute altered mentation as evidenced by agitation with findings of leukocytosis with left shift, borderline hypothermia, acute kidney injury, and markedly elevated lactic acid of 5.92 along with Tachycardia, Tachypenia, and concerning for sepsis physiology -Abnormal physiology could be explained by severe dehydration with acidemia secondary to offending medications (Lasix, metformin, ACE inhibitor) -Given 3 Liters of IV Boluses in ED; Now on Maintence IVF with D5W+NS at 75 mL/hr -Urinalysis abnormal so potential source for infection; Urine Cx Pending  -Broad spectrum Abx with IV Cefepime now D/C'd -Procalcitonin -LA as below -WBC went from 13.6 -> 6.4 -> 8.0 -Follow up on blood cultures and urine culture; Blood Cx Show NGTD at 2 Days and Urine Cx Showed No Growth  -No plans to escalate care and if patient worsened or not improving in 24 hours consideration will be given to de-escalating care and focusing on comfort (see below) -Palliative Care Medicine Consulted and patient being transitioned to [place] with Medications for Patient's comfort including Alprazolam 0.5 mg po BID daily, Lorazepam 1 mg IV q4hprn for Anxiety/Seizure, Morphine 4 mg IV q3hprn Severe Pain, Zofran po/IV 4 mg q6hprn  AKI -Baseline renal function: 27 and 1.05 in June -  Admission renal function: 73 and 3.56 -Patient's BUN/Cr went from 73/3.56 -> 59/2.21 -> 32/1.48 -Multifactorial etiology: Poor oral intake and setting of continued administration of Lasix and ACE inhibitor as well as metformin -Hold offending medications including  Lasix and ACE -IV fluid hydration at 100/hr changed to D5W NS at 75 mL/hr -Acidemia could be 2/2 acute kidney injury alone -Will not repeat CMP in AM as patient being transitioned to [place]  Lactic Acidosis -Patient's Lactic Acidosis went from 5.92 -> 0.9 -C/w IVF as Above  Severe Dementia with Agitation  -Patient with progressive Failure to thrive over the past year and has recently been placed in a memory care ALF -Patient may be ready to transition to a SNF -PT/OT evaluation -Currently DO NOT RESUSCITATE but family needs assistance in evaluating end-of-life [place] and navigating the MOST form especially regarding decisions about when appropriate to treat and not treat -Given response to above treatments, patient may be eligible for residential hospice -[place] for Goals of Care Family Meeting on 05/15/17; Family electing to make the comfort and transitioning to Residential Hospice  -C/w Escitalopram 10 mg po Daily, Alprazolam 0.25 mg po BID, and Quetiapine 25 mg po BID -Patient being transitioned to [place]   Normocytic Anemia -Baseline hemoglobin 11 -Hb/Hct went from 8.5/26.6 -> 8.4/25.6 -> 9.1/27.8 -No S/Sx of Bleeding per Family  -Will not check CBC in AM now that patient is being transitioned to [place]  Takotsubo cardiomyopathy/NICM -Last Echocardiogram completed 2011 with an EF of 25% and has not undergone follow-up echocardiogram since -Patient on beta blocker, ACE inhibitor and Lasix prior to admission -Currently with acute kidney injury so ACE inhibitor as well as Lasix on hold -Given failure to thrive and poor oral intake will discontinue ACE inhibitor and Lasix -Patient transitioned to [place]  Type 2 Diabetes Mellitus, uncontrolled -Current CBG uncontrolled greater than 300 and could be reflective of underlying infectious process noting this is not typical for patient and she has apparently had poor oral intake and possible weight  loss -HgbA1c -Hold Glucotrol and Metformin -Given poor oral intake and risks for additional dehydration state and associated acute kidney injury would not resume metformin or Glucotrol and instead utilize SSI after discharge -Patient on D5W+NS at 75 mL/hr given Hypoglycemia overnight -C/w Sensitive Novolog SSI AC/HS  Coronary Artery Disease -Currently asymptomatic -Last saw primary Cardiologist Dr. Johnsie Cancel December 2017 -ACE and BB held currently  -Patient going to [place]  SSS/AF -100% ventricular paced -C/w Apixaban 2.5 mg po BID due to Renal Insufficiency  -Beta blocker on hold secondary to suboptimal blood pressure readings -Patient transitioning to [place] and will likely go to Residential Hospice if bed is available  HTN (hypertension) -Home medications on hold secondary to suboptimal blood pressure readings and dehydration  Poor Po Intake -Palliative Care Medicine to meet with the Family to discuss Goals of Care and patient to be transitioned to [place] and hopeful for [facility] in AM  DVT prophylaxis: Anticoagulated with Apixaban  Code Status: DO NOT RESUSCITATE Family Communication: Discussed with Daughters and family at bedside Disposition Plan: Residential Hospice  Consultants:   Palliative Care Medicine   Procedures: None   Antimicrobials: Anti-infectives    Start     Dose/Rate Route Frequency Ordered Stop   05/14/17 1500  ceFEPIme (MAXIPIME) 1 g in dextrose 5 % 50 mL IVPB  Status:  Discontinued     1 g 100 mL/hr over 30 Minutes Intravenous Every 24  hours 05/13/17 1534 05/15/17 1120   05/13/17 1430  ceFEPIme (MAXIPIME) 2 g in dextrose 5 % 50 mL IVPB     2 g 100 mL/hr over 30 Minutes Intravenous  Once 05/13/17 1424 05/13/17 1543     Subjective: Seen and examined at bedside and patient still unable to provide a subjective History. After Palliative Care met with the patient, the patient's family elected to transition the patient to comfort  care and electing to transition to Residential Hospice and hoping for bed placement at [facility] in AM.   Objective: Vitals:   05/13/17 2120 05/14/17 0526 05/14/17 2105 05/15/17 0411  BP: (!) 100/43 (!) 114/44 133/63 132/90  Pulse: 64 62 77 68  Resp: _0 Temp: 98.3 F (36.8 C) 98.5 F (36.9 C) 98.2 F (36.8 C) 99.2 F (37.3 C)  TempSrc: Oral Oral Oral Oral  SpO2: 91% 98% 95% 99%  Weight:      Height:        Intake/Output Summary (Last 24 hours) at 05/15/17 1509 Last data filed at 05/15/17 7616  Gross per 24 hour  Intake          1171.25 ml  Output                0 ml  Net          1171.25 ml   Filed Weights   05/13/17 1419 05/13/17 1752  Weight: 72.6 kg (160 lb) 72.6 kg (160 lb)   Examination: Physical Exam:  Constitutional: WN/WD elderly demented Caucasian female in NAD appears slightly agitated Eyes: Lids and Conjunctivae appear normal. Sclerae anicteric ENMT: External Ears and nose appear normal. Mucous Membranes are moister Neck: Supple with no appreciable JVD Respiratory: CTAB; No wheezing/rales/rhonchi. Patient was not tachypenic or using any accessory muscles to breathe Cardiovascular: RRR; No appreciable edema Abdomen:  Soft, NT, ND. Bowel Sounds present GU: Deferred. Musculoskeletal: No contractures. Moves all extremities sponatenously   Skin: Warm and dry; No rashes or lesions Neurologic: CN 2-12 grossly intact. Confused and agitated. No appreciable focal deficits.  Psychiatric: Impaired judgement and insight. Awake but not alert.   Data Reviewed: I have personally reviewed following labs and imaging studies  CBC:  Recent Labs Lab 05/13/17 1413 05/14/17 0448 05/15/17 0736  WBC 13.6* 6.4 8.0  NEUTROABS 10.9*  --  5.6  HGB 8.5* 8.4* 9.1*  HCT 26.6* 25.6* 27.8*  MCV 95.7 95.9 94.6  PLT 289 142* 073   Basic Metabolic Panel:  Recent Labs Lab 05/13/17 1400 05/13/17 1413 05/14/17 0448 05/15/17 0736  NA  --  138 140 143  K  --  5.1  4.2 3.9  CL  --  97* 110 112*  CO2  --  _1 GLUCOSE  --  310* 114* 155*  BUN  --  73* 59* 32*  CREATININE  --  3.56* 2.21* 1.48*  CALCIUM  --  9.4 8.1* 8.7*  MG 1.9  --   --  1.4*  PHOS  --   --   --  2.0*   GFR: Estimated Creatinine Clearance: 30.2 mL/min (A) (by C-G formula based on SCr of 1.48 mg/dL (H)). Liver Function Tests:  Recent Labs Lab 05/13/17 1413 05/14/17 0448 05/15/17 0736  AST 31 16 <5*  ALT _2 ALKPHOS 67 42 47  BILITOT 0.6 0.7 0.7  PROT 7.2 5.2* 6.1*  ALBUMIN 3.6 2.7* 2.9*   No results for input(s): LIPASE, AMYLASE in  the last 168 hours. No results for input(s): AMMONIA in the last 168 hours. Coagulation Profile:  Recent Labs Lab 05/13/17 1548  INR 1.38   Cardiac Enzymes: No results for input(s): CKTOTAL, CKMB, CKMBINDEX, TROPONINI in the last 168 hours. BNP (last 3 results) No results for input(s): PROBNP in the last 8760 hours. HbA1C:  Recent Labs  05/14/17 0448  HGBA1C 5.4   CBG:  Recent Labs Lab 05/14/17 2114 05/15/17 0010 05/15/17 0415 05/15/17 0741 05/15/17 1221  GLUCAP 184* 208* 182* 152* 150*   Lipid Profile: No results for input(s): CHOL, HDL, LDLCALC, TRIG, CHOLHDL, LDLDIRECT in the last 72 hours. Thyroid Function Tests: No results for input(s): TSH, T4TOTAL, FREET4, T3FREE, THYROIDAB in the last 72 hours. Anemia Panel: No results for input(s): VITAMINB12, FOLATE, FERRITIN, TIBC, IRON, RETICCTPCT in the last 72 hours. Sepsis Labs:  Recent Labs Lab 05/13/17 1434 05/13/17 1945 05/14/17 0448  LATICACIDVEN 5.92* 2.8* 0.9    Recent Results (from the past 240 hour(s))  Blood Culture (routine x 2)     Status: None (Preliminary result)   Collection Time: 05/13/17  2:13 PM  Result Value Ref Range Status   Specimen Description BLOOD RIGHT ANTECUBITAL  Final   Special Requests   Final    BOTTLES DRAWN AEROBIC AND ANAEROBIC Blood Culture adequate volume   Culture NO GROWTH 2 DAYS  Final   Report Status  PENDING  Incomplete  Urine culture     Status: None   Collection Time: 05/13/17  3:15 PM  Result Value Ref Range Status   Specimen Description URINE, CATHETERIZED  Final   Special Requests NONE  Final   Culture NO GROWTH  Final   Report Status 05/14/2017 FINAL  Final  Blood Culture (routine x 2)     Status: None (Preliminary result)   Collection Time: 05/13/17  3:32 PM  Result Value Ref Range Status   Specimen Description BLOOD RIGHT FOREARM  Final   Special Requests IN PEDIATRIC BOTTLE Blood Culture adequate volume  Final   Culture NO GROWTH 2 DAYS  Final   Report Status PENDING  Incomplete  MRSA PCR Screening     Status: Abnormal   Collection Time: 05/13/17 10:11 PM  Result Value Ref Range Status   MRSA by PCR POSITIVE (A) NEGATIVE Final    Comment:        The GeneXpert MRSA Assay (FDA approved for NASAL specimens only), is one component of a comprehensive MRSA colonization surveillance program. It is not intended to diagnose MRSA infection nor to guide or monitor treatment for MRSA infections. RESULT CALLED TO, READ BACK BY AND VERIFIED WITH: RN Orlene Plum [id number]  [facility]     Radiology Studies: No results found. Scheduled Meds: . ALPRAZolam  0.5 mg Oral BID  . apixaban  2.5 mg Oral BID  . Chlorhexidine Gluconate Cloth  6 each Topical Q0600  . escitalopram  10 mg Oral Daily  . insulin aspart  0-5 Units Subcutaneous QHS  . insulin aspart  0-9 Units Subcutaneous TID WC  . mupirocin ointment  1 application Nasal BID  . QUEtiapine  25 mg Oral BID  . sodium chloride flush  3 mL Intravenous Q12H  . traZODone  25 mg Oral QHS   Continuous Infusions: . dextrose 5 % and 0.45 % NaCl with KCl 20 mEq/L 75 mL/hr at 05/15/17 1113    LOS: 2 days   Kerney Elbe, DO [facility] Pager [phone]  If 7PM-7AM, please contact night-coverage [email or web address] Password  [email or web address] 05/15/2017, 3:09 PM

## 2017-05-15 NOTE — Consult Note (Addendum)
[place] Liaison  Received request from Palliative Medicine NP Vinie Sill for family interest in [facility]. Left message on [name]'s phone making aware of request. Met with family to answer questions and explain services. Family exhausted from being here overnight and maintaining vigil. [place] room is available 05/16/17. Family aware and agreeable to transfer. Plan to meet HCPOA/son-in-law in am to complete paper work before transfer.   Please call with questions.   Thank you,  Erling Conte, LCSW [phone]

## 2017-05-15 NOTE — Progress Notes (Signed)
Daily Progress Note   Patient Name: [name]       Date: 05/15/2017 DOB: [birth year]  Age: 82 y.o. MRN#: [id number] Attending Physician: Kerney Elbe, DO Primary Care Physician: Burnard Bunting, MD Admit Date: 05/13/2017  Reason for Consultation/Follow-up: Establishing goals of care  Subjective: Patient resting in bed, she is confused per her baseline. We met with family at bedside, and transitioned to conference room. POA present Catarina Hartshorn, spouse of one of the the daughters. All three daughters were present for conversation. We discussed feeding tube placement as patient has not been eating or drinking well. Family states that this will not fix her dementia, and that they want her to be comfortable. The family states they are ready to proceed with hospice facility placement. Confirmed with facility that they do have beds available and will work towards placement tomorrow.      Length of Stay: 2  Current Medications: Scheduled Meds:  . ALPRAZolam  0.25 mg Oral BID  . apixaban  2.5 mg Oral BID  . Chlorhexidine Gluconate Cloth  6 each Topical Q0600  . escitalopram  10 mg Oral Daily  . insulin aspart  0-5 Units Subcutaneous QHS  . insulin aspart  0-9 Units Subcutaneous TID WC  . mupirocin ointment  1 application Nasal BID  . QUEtiapine  25 mg Oral BID  . sodium chloride flush  3 mL Intravenous Q12H    Continuous Infusions: . ceFEPime (MAXIPIME) IV Stopped (05/14/17 1555)  . dextrose 5 % and 0.45 % NaCl with KCl 20 mEq/L      PRN Meds: acetaminophen **OR** acetaminophen, LORazepam, morphine injection, ondansetron **OR** ondansetron (ZOFRAN) IV  Physical Exam  Constitutional:  Fidgeting and reaching out to hold hands. Agitated.   HENT:  Head: Normocephalic and  atraumatic.  Eyes:  Eyes closed, will split open intermittently.  Cardiovascular:  Warm and dry  Pulmonary/Chest: Effort normal. No respiratory distress.  Abdominal: Soft. There is no tenderness. There is no guarding.  Musculoskeletal: She exhibits no edema.  Neurological: She is alert.  Dementia at baseline. Confused, unintelligible currently.  Skin: Skin is warm and dry.            Vital Signs: BP 132/90 (BP Location: Right Arm)   Pulse 68   Temp 99.2 F (37.3 C) (Oral)  Resp 19   Ht '5\' 5"'  (1.651 m)   Wt 72.6 kg (160 lb)   SpO2 99%   BMI 26.63 kg/m  SpO2: SpO2: 99 % O2 Device: O2 Device: Not Delivered O2 Flow Rate:    Intake/output summary:  Intake/Output Summary (Last 24 hours) at 05/15/17 1052 Last data filed at 05/15/17 2536  Gross per 24 hour  Intake          1321.25 ml  Output              600 ml  Net           721.25 ml   LBM: Last BM Date:  (pta) Baseline Weight: Weight: 72.6 kg (160 lb) (Obtained from [facility] on 04/17/17) Most recent weight: Weight: 72.6 kg (160 lb)       Palliative Assessment/Data: 20% at best    Flowsheet Rows     Most Recent Value  Intake Tab  Referral Department  Hospitalist  Unit at Time of Referral  Med/Surg Unit  Palliative Care Primary Diagnosis  Sepsis/Infectious Disease  Date Notified  05/13/17  Palliative Care Type  New Palliative care  Reason for referral  Clarify Goals of Care, Advance Care Planning, Counsel Regarding Hospice  Date of Admission  05/13/17  Date first seen by Palliative Care  05/14/17  # of days Palliative referral response time  1 Day(s)  # of days IP prior to Palliative referral  0  Clinical Assessment  Palliative Performance Scale Score  30%  Pain Max last 24 hours  Not able to report  Pain Min Last 24 hours  Not able to report  Dyspnea Max Last 24 Hours  Not able to report  Dyspnea Min Last 24 hours  Not able to report  Nausea Max Last 24 Hours  Not able to report  Nausea Min Last 24 Hours   Not able to report  Anxiety Max Last 24 Hours  Not able to report  Anxiety Min Last 24 Hours  Not able to report  Other Max Last 24 Hours  Not able to report  Psychosocial & Spiritual Assessment  Palliative Care Outcomes  Patient/Family meeting held?  No  Palliative Care follow-up planned  Yes, Facility      Patient Active Problem List   Diagnosis Date Noted  . Palliative care by specialist   . Sepsis ([place]) 05/13/2017  . Severe dementia 05/13/2017  . Acute kidney injury ([place]) 05/13/2017  . Type 2 diabetes mellitus, uncontrolled ([place]) 05/13/2017  . Coronary artery disease 05/13/2017  . Anemia 05/13/2017  . Takotsubo cardiomyopathy 05/13/2017  . HTN (hypertension) 05/13/2017  . Chest pain 02/01/2012  . Hypertension 02/01/2012  . Type 2 diabetes mellitus ([place]) 02/01/2012  . Pacemaker 02/02/2011  . Long term (current) use of anticoagulants 01/11/2011  . HYPERCHOLESTEROLEMIA 01/28/2009  . ANXIETY 01/28/2009  . MYOCARDIAL INFARCTION 01/28/2009  . Coronary atherosclerosis 01/28/2009  . CARDIOMYOPATHY 01/28/2009  . PAROXYSMAL ATRIAL FIBRILLATION 01/28/2009  . SICK SINUS SYNDROME 01/28/2009  . BRADYCARDIA 01/28/2009  . CHF 01/28/2009  . GERD 01/28/2009  . OSTEOARTHRITIS 01/28/2009  . EDEMA 01/28/2009  . SHORTNESS OF BREATH 01/28/2009    Palliative Care Assessment & Plan   Patient Profile: 82 y.o. female  with past medical history of Coronary artery disease with heart catheterization in 2011 with an EF of 35-40% (echocardiogram 2011 shows EF at 25%), nonischemic cardiomyopathy, atrial fib, sick sinus syndrome status post pacemaker in 2004, hypertension, dyslipidemia, diabetes, moderate to severe dementia with  a progressive functional decline over the past 12 months admitted on 05/13/2017 with agitation, decreased oral intake. Upon admission patient had a lactic acid level of 5.9, acute kidney injury with a creatinine of 3.56. Chest x-ray was negative for acute processes, UA with  moderate amount of leukocyte esterase. Patient was started on IV antibiotics.   Assessment: [name] is an 82 y/o female with baseline dementia and unintelligible speech who is reaching out holding hands and fidgiting. She has had poor appetite and does not want to take medication, and has an overall failure to thrive.    Recommendations/Plan: [facility] to see patient for placement.   Goals of Care and Additional Recommendations:  Limitations on Scope of Treatment: Full Comfort Care  Code Status:    Code Status Orders        Start     Ordered   05/13/17 1641  Do not attempt resuscitation (DNR)  Continuous    Question Answer Comment  In the event of cardiac or respiratory ARREST Do not call a "code blue"   In the event of cardiac or respiratory ARREST Do not perform Intubation, CPR, defibrillation or ACLS   In the event of cardiac or respiratory ARREST Use medication by any route, position, wound care, and other measures to relive pain and suffering. May use oxygen, suction and manual treatment of airway obstruction as needed for comfort.      05/13/17 1641    Code Status History    Date Active Date Inactive Code Status Order ID Comments User Context   05/13/2017  3:42 PM 05/13/2017  4:41 PM DNR [id number]  Samella Parr, NP ED    Advance Directive Documentation     Most Recent Value  Type of Advance Directive  Living will  Pre-existing out of facility DNR order (yellow form or pink MOST form)  -  "MOST" Form in Place?  -       Prognosis:   < 2 weeks with only sips or bites of intake and difficulty even taking po medications at this point.   Discharge Planning:  Hospice facility  Care plan was discussed with daughters, POA, and Harmon Pier with [employer]. Message left for SW, and Dr. Alfredia Ferguson.  Thank you for allowing the Palliative Medicine Team to assist in the care of this patient.   Time In: 10:00 Time Out: 11:15 1 hour 15 minutes  Prolonged Time Billed  no         Greater than 50%  of this time was spent counseling and coordinating care related to the above assessment and plan.  Asencion Gowda, NP   Vinie Sill, NP Palliative Medicine Team Pager # [phone] (M-F 8a-5p) Team Phone # [phone] (Nights/Weekends)  Please contact Palliative Medicine Team phone at [phone] for questions and concerns.

## 2017-05-15 NOTE — Progress Notes (Signed)
PT Cancellation Note  Patient Details Name: CATALEA BOUVIA MRN: [id number] DOB: [birth year]   Cancelled Treatment:    Reason Eval/Treat Not Completed: Medical issues which prohibited therapy.  Palliative Care meeting with family today.  Goal is to transfer to Hospice facility tomorrow with comfort care.  PT will sign off.  Thank you.   Vena Austria 05/15/2017, 12:57 PM Durenda Hurt. Renaldo Fiddler, [facility] Acute Rehab Services Pager [phone]

## 2017-05-16 DIAGNOSIS — Z515 Encounter for palliative care: Secondary | ICD-10-CM

## 2017-05-16 DIAGNOSIS — Z7189 Other specified counseling: Secondary | ICD-10-CM

## 2017-05-16 LAB — GLUCOSE, CAPILLARY
GLUCOSE-CAPILLARY: 183 mg/dL — AB (ref 65–99)
Glucose-Capillary: 155 mg/dL — ABNORMAL HIGH (ref 65–99)
Glucose-Capillary: 153 mg/dL — ABNORMAL HIGH (ref 65–99)

## 2017-05-16 MED ORDER — LORAZEPAM 2 MG/ML IJ SOLN
1.0000 mg | INTRAMUSCULAR | 0 refills | Status: DC | PRN
Start: 2017-05-16 — End: 2017-05-16

## 2017-05-16 MED ORDER — CHLORHEXIDINE GLUCONATE CLOTH 2 % EX PADS: 6.0000 | MEDICATED_PAD | Freq: Every day | CUTANEOUS | Status: AC

## 2017-05-16 MED ORDER — MUPIROCIN 2 % EX OINT: 1.0000 "application " | TOPICAL_OINTMENT | Freq: Two times a day (BID) | CUTANEOUS | 0 refills | Status: AC

## 2017-05-16 MED ORDER — LORAZEPAM 2 MG/ML IJ SOLN: 1.0000 mg | INTRAMUSCULAR | 0 refills | Status: AC | PRN

## 2017-05-16 MED ORDER — ONDANSETRON HCL 4 MG PO TABS: 4.0000 mg | ORAL_TABLET | Freq: Four times a day (QID) | ORAL | 0 refills | Status: AC | PRN

## 2017-05-16 MED ORDER — MORPHINE SULFATE (CONCENTRATE) 10 MG/0.5ML PO SOLN: 5.0000 mg | ORAL | 0 refills | Status: AC | PRN

## 2017-05-16 MED ORDER — GLYCOPYRROLATE 0.2 MG/ML IJ SOLN: 0.2000 mg | INTRAMUSCULAR | 0 refills | Status: AC | PRN

## 2017-05-16 MED ORDER — ALPRAZOLAM 0.5 MG PO TABS: 0.5000 mg | ORAL_TABLET | Freq: Two times a day (BID) | ORAL | 0 refills | Status: AC

## 2017-05-16 MED ORDER — TRAZODONE HCL 50 MG PO TABS: 25.0000 mg | ORAL_TABLET | Freq: Every day | ORAL | Status: AC

## 2017-05-16 NOTE — Discharge Summary (Signed)
Physician Discharge Summary  [name]:[id number] DOB: 1935/10/10 DOA: 05/13/2017  PCP: Burnard Bunting, MD  Admit date: 05/13/2017 Discharge date: 05/16/2017  Admitted From: ALF Disposition:  [place]  Recommendations for Outpatient Follow-up:  1. Follow up Care per Hospice Protocol  Home Health: No Equipment/Devices: None    Discharge Condition: Comfort Care CODE STATUS: DO NOT RESUSCITATE Diet recommendation: Carb Modified Heart Healthy Diet  Brief/Interim Summary: [name] a 82 y.o.femalewith medical history significant forCAD catheterization 2011 with EF 35-40%, nonischemic cardiomyopathy with EF 25% based on 2011 Echo, Atrial Fibrillation on anticoagulation, SSS status post pacemaker 2004, Hypertension, Dyslipidemia, Diabetes on oral agents and Dementia and other comorbids. Patient has had steady functional decline over the past 12 months and in May she was placed in an assisted living facility. She has lifelong issues with anxiety and mild agitation which has been further worsened by her underlying dementia diagnosis. The family reports that for months she has had poor oral intake including her favorite foods. Patient was at her baseline mentation on Thursday but over the next several days had rapid decline became more agitated. She was brought to the ER where she had a rectal temperature of 98.4, she had borderline hypotension with a BP of 97/36 with an MAP of 54.  She was found to have an acute kidney injury with a BUN of 73 and a creatinine of 3.56, glucose elevated at 310, lactic acid elevated at 5.9 to, white count elevated at 13,600 neutrophils 80% and absolute neutrophils 10.9%. Chest x-ray without evidence of pneumonia. EDP has opted to treat as sepsis of unknown source so has begun broad-spectrum antibiotics. Palliative Care was consulted as family is concerned about recent decline and poor po intake. Patient's numbers clinically improved  and patient's family to have Family met with Palliative Care. After extensive discussion family states that because she has such a poor po intake and has been refusing to eat they feel like a feeding tube will not fix her dementia and they want her to be comfortable and have elected to proceed with Hospice facility placement. Patient was made [place] and will transition to [facility] Today. Further Care indicated per Hospice Protocol.   Discharge Diagnoses:  Principal Problem:   Sepsis ([place]) Active Problems:   Severe dementia   Acute kidney injury ([place])   Type 2 diabetes mellitus, uncontrolled ([place])   Coronary artery disease   Anemia   Takotsubo cardiomyopathy   HTN (hypertension)   Palliative care by specialist   Goals of care, counseling/discussion   Palliative care encounter  Sepsis With unclear Etiology  -Patient presented with acute altered mentation as evidenced by agitation with findings of leukocytosis with left shift, borderline hypothermia, acute kidney injury, and markedly elevated lactic acid of 5.92 along with Tachycardia, Tachypenia, andconcerning for sepsis physiology -Abnormal physiology could be explained by severe dehydration with acidemia secondary to offending medications (Lasix, metformin, ACE inhibitor) -Given 3 Liters of IV Boluses in ED; Now on Maintence IVF with D5W+NS at 75 mL/hr -Urinalysis abnormal so potential source for infection; Urine Cx Pending  -Broad spectrum Abx with IV Cefepime now D/C'd -Procalcitonin -LA as below -WBC went from 13.6 -> 6.4 -> 8.0 -Follow up on blood cultures and urine culture; Blood Cx Show NGTD at 2 Days and Urine Cx Showed No Growth  -No plans to escalate care and if patient worsened or not improving in 24 hours consideration will be given to de-escalating care and  focusing on comfort (see below) -Palliative Care Medicine Consulted and patient being transitioned to [place] with Medications for  Patient's comfort including Alprazolam 0.5 mg po BID daily, Lorazepam 1 mg IV q4hprn for Anxiety/Seizure, Morphine 4 mg IV q3hprn Severe Pain, Zofran po/IV 4 mg q6hprn while Hospitalized -Further Care per Hospice Protocol   AKI -Baseline renal function: 27 and 1.05 in June -Admission renal function: 73 and 3.56 -Patient's BUN/Cr went from 73/3.56 -> 59/2.21 -> 32/1.48 -Multifactorial etiology: Poor oral intake and setting of continued administration of Lasix and ACE inhibitor as well as metformin -Hold offending medications including Lasix and ACE -IV fluid hydration at 100/hr changed to D5W NS at 75 mL/hr -Acidemia could be 2/2 acute kidney injury alone -Will not repeat CMP in AM as patient being transitioned to [place] and going to Hospice today   Lactic Acidosis -Improved Patient's Lactic Acidosis went from 5.92 -> 0.9 -Had IVF while hospitalized   Severe Dementia with Agitation  -Patient with progressive Failure to thrive over the past year and has recently been placed in a memory care ALF -Patient may be ready to transition to a SNF -PT/OT evaluation -Currently DO NOT RESUSCITATE but family needs assistance in evaluating end-of-life GOC and navigating the MOSTform especially regarding decisions about when appropriate to treat and not treat -Given response to above treatments, patient may be eligible for residential hospice -Palliative Care Consulted for Goals of Care Family Meeting on 05/15/17; Family electing to make the comfort and transitioning to Residential Hospice  -C/w Escitalopram 10 mg po Daily, Alprazolam 0.25 mg po BID, and Quetiapine 25 mg po BID -*Had to be given several Doses of Ativan overnight due to Agitation -Patient being transitioned to [place] and Further Care per Hospice Protocol  Normocytic Anemia -Baseline hemoglobin 11 -Hb/Hct went from 8.5/26.6 -> 8.4/25.6 -> 9.1/27.8 -No S/Sx of Bleeding per Family  -Will not check CBC in AM now that  patient is being transitioned to [place] and going to [facility]  Takotsubo cardiomyopathy/NICM -Last Echocardiogram completed 2011 with an EF of 25% and has not undergone follow-up echocardiogram since -Patient on beta blocker, ACE inhibitor and Lasix prior to admission -Currently with acute kidney injury so ACE inhibitor as well as Lasix on hold -Given failure to thrive and poor oral intake will discontinue ACE inhibitor and Lasix -Patient transitioned to [place] and going to [facility] today   Type 2 Diabetes Mellitus -HgbA1c 5.4 this Admission -Stopped Glucotrol and Metformin -Given poor oral intake and risks for additional dehydration state and associated acute kidney injury would not resume metformin or Glucotrol  -Patient was given D5W+NS at 75 mL/hr given Hypoglycemia  -Follow up Care per Hospice Protocol   Coronary Artery Disease -Currently asymptomatic -Lastsaw primary Cardiologist Dr. Sharon Seller 2017 -ACE and BB held currently  -Patient going to [place] and going to [facility] today  SSS/AF -100% ventricular paced -Stopped Apixaban 2.5 mg po BID  -Beta blocker on hold secondary to suboptimal blood pressure readings -Patient transitioning to [place] and will likely go to Residential Hospice today   HTN (hypertension) -Home medications stopped secondary to suboptimal blood pressure readings and dehydration as well as patient being Hospice   Poor Po Intake -Palliative Care Medicine to meet with the Family to discuss Goals of Care and patient to be transitioned to [place] and going to [facility] today/   Discharge Instructions  Discharge Instructions    Call MD  for:  difficulty breathing, headache or visual disturbances    Complete by:  As directed    Call MD for:  extreme fatigue    Complete by:  As directed    Call MD for:  hives    Complete by:  As directed    Call MD for:  persistant dizziness  or light-headedness    Complete by:  As directed    Call MD for:  persistant nausea and vomiting    Complete by:  As directed    Call MD for:  redness, tenderness, or signs of infection (pain, swelling, redness, odor or green/yellow discharge around incision site)    Complete by:  As directed    Call MD for:  severe uncontrolled pain    Complete by:  As directed    Call MD for:  temperature >100.4    Complete by:  As directed    Diet - low sodium heart healthy    Complete by:  As directed    Discharge instructions    Complete by:  As directed    Follow up Care per Hospice Protocol   Increase activity slowly    Complete by:  As directed      Allergies as of 05/16/2017      Reactions   Penicillins Rash   Happened many years ago Has patient had a PCN reaction causing immediate rash, facial/tongue/throat swelling, SOB or lightheadedness with hypotension: Yes Has patient had a PCN reaction causing severe rash involving mucus membranes or skin necrosis: Unknown Has patient had a PCN reaction that required hospitalization Unknown Has patient had a PCN reaction occurring within the last 10 years: No If all of the above answers are "NO", then may proceed with Cephalosporin use.      Medication List    STOP taking these medications   apixaban 2.5 MG Tabs tablet Commonly known as:  ELIQUIS   benazepril 20 MG tablet Commonly known as:  LOTENSIN   divalproex 125 MG capsule Commonly known as:  DEPAKOTE SPRINKLE   escitalopram 10 MG tablet Commonly known as:  LEXAPRO   furosemide 40 MG tablet Commonly known as:  LASIX   glipiZIDE 5 MG tablet Commonly known as:  GLUCOTROL   guaifenesin 100 MG/5ML syrup Commonly known as:  ROBITUSSIN   metFORMIN 500 MG tablet Commonly known as:  GLUCOPHAGE   metoprolol succinate 50 MG 24 hr tablet Commonly known as:  TOPROL-XL   nitroGLYCERIN 0.4 MG SL tablet Commonly known as:  NITROSTAT   spironolactone 25 MG tablet Commonly known as:   ALDACTONE     TAKE these medications   acetaminophen 500 MG tablet Commonly known as:  TYLENOL Take 1 tablet (500 mg total) by mouth every 6 (six) hours as needed for mild pain or moderate pain.   ALPRAZolam 0.5 MG tablet Commonly known as:  XANAX Take 1 tablet (0.5 mg total) by mouth 2 (two) times daily. What changed:  how much to take   Chlorhexidine Gluconate Cloth 2 % Pads Apply 6 each topically daily at 6 (six) AM.   glycopyrrolate 0.2 MG/ML injection Commonly known as:  ROBINUL Inject 1 mL (0.2 mg total) into the vein every 4 (four) hours as needed (secretions, gurgling).   loperamide 2 MG capsule Commonly known as:  IMODIUM Take 2 mg by mouth as needed for diarrhea or loose stools.   LORazepam 2 MG/ML injection Commonly known as:  ATIVAN Inject 0.5 mLs (1 mg total) into the vein every  4 (four) hours as needed for anxiety or seizure.   magnesium hydroxide 400 MG/5ML suspension Commonly known as:  MILK OF MAGNESIA Take 30 mLs by mouth at bedtime as needed for mild constipation.   [place] 200-200-20 MG/5ML suspension Generic drug:  alum & mag hydroxide-simeth Take 30 mLs by mouth every 6 (six) hours as needed for indigestion or heartburn.   morphine CONCENTRATE 10 MG/0.5ML Soln concentrated solution Take 0.25 mLs (5 mg total) by mouth every 2 (two) hours as needed for severe pain or shortness of breath.   mupirocin ointment 2 % Commonly known as:  BACTROBAN Place 1 application into the nose 2 (two) times daily.   neomycin-bacitracin-polymyxin ointment Commonly known as:  NEOSPORIN Apply 1 application topically as needed for wound care. apply to eye   nystatin powder Generic drug:  nystatin Apply topically 2 (two) times daily. For 14 days   ondansetron 4 MG tablet Commonly known as:  ZOFRAN Take 1 tablet (4 mg total) by mouth every 6 (six) hours as needed for nausea.   QUEtiapine 25 MG tablet Commonly known as:  SEROQUEL Take 25 mg by mouth 2 (two) times  daily.   traZODone 50 MG tablet Commonly known as:  DESYREL Take 0.5 tablets (25 mg total) by mouth at bedtime.       Allergies  Allergen Reactions  . Penicillins Rash    Happened many years ago Has patient had a PCN reaction causing immediate rash, facial/tongue/throat swelling, SOB or lightheadedness with hypotension: Yes Has patient had a PCN reaction causing severe rash involving mucus membranes or skin necrosis: Unknown Has patient had a PCN reaction that required hospitalization Unknown Has patient had a PCN reaction occurring within the last 10 years: No If all of the above answers are "NO", then may proceed with Cephalosporin use.    Consultations: Palliative Care Medicine  Procedures/Studies: Ct Head Wo Contrast  Result Date: 05/13/2017 CLINICAL DATA:  increased AMS. Per facility staff, pt "is acting different than normal" and has increased sugar Copied from ER Triage note * Patient scanned x 2 with immobilization. Did not hold still EXAM: CT HEAD WITHOUT CONTRAST TECHNIQUE: Contiguous axial images were obtained from the base of the skull through the vertex without intravenous contrast. COMPARISON:  03/12/2017 FINDINGS: Brain: There is central and cortical atrophy. Periventricular white matter changes are consistent with small vessel disease. There small lacunar infarcts of the basal ganglia bilaterally. There is no intra or extra-axial fluid collection or mass lesion. The basilar cisterns and ventricles have a normal appearance. There is no CT evidence for acute infarction or hemorrhage. Vascular: There is atherosclerotic calcification of the carotid siphons. Skull: Normal. Negative for fracture or focal lesion. Sinuses/Orbits: No acute finding. Other: Study quality is degraded by patient motion artifact. IMPRESSION: 1. Significant atrophy and small vessel disease. 2. Remote lacunar infarcts of the basal ganglia. 3.  No evidence for acute intracranial abnormality. Electronically  Signed   By: Nolon Nations M.D.   On: 05/13/2017 14:45   Dg Chest Port 1 View  Result Date: 05/13/2017 CLINICAL DATA:  82 year old female with altered mental status EXAM: PORTABLE CHEST 1 VIEW COMPARISON:  Prior chest x-ray 07/02/2015 FINDINGS: Left subclavian approach cardiac rhythm maintenance device. Leads project over the right atrium and right ventricle. Stable cardiomegaly. Atherosclerotic calcifications in the transverse aorta. No patchy air scratch then no new focal airspace opacity, pulmonary edema, pleural effusion or pneumothorax. Stable mild bronchitic changes. Incompletely imaged anterior cervical stabilization hardware. IMPRESSION: No active  disease. Electronically Signed   By: Jacqulynn Cadet M.D.   On: 05/13/2017 14:26     Subjective: Seen and examined and was confused and demented and resting with eyes closed. Per family she was agitated last night.   Discharge Exam: Vitals:   05/15/17 2027 05/16/17 0635  BP: (!) 140/55 135/89  Pulse: 63 62  Resp: (!) 22 (!) 24  Temp: 99.4 F (37.4 C) 98 F (36.7 C)   Vitals:   05/15/17 0411 05/15/17 1541 05/15/17 2027 05/16/17 0635  BP: 132/90 132/66 (!) 140/55 135/89  Pulse: 68 62 63 62  Resp: 19  (!) 22 (!) 24  Temp: 99.2 F (37.3 C) 99.1 F (37.3 C) 99.4 F (37.4 C) 98 F (36.7 C)  TempSrc: Oral Oral Oral Oral  SpO2: 99% 100% 94% 99%  Weight:      Height:       General: Pt is agitated but resting and will not open eyes. Baseline demented and confused.  Cardiovascular: Tachycardic Rate but regular rhythm, S1/S2 +, no rubs, no gallops Respiratory: CTA bilaterally, no wheezing, no rhonchi; No  Abdominal: Soft, NT, ND, bowel sounds + Extremities: no edema, no cyanosis  The results of significant diagnostics from this hospitalization (including imaging, microbiology, ancillary and laboratory) are listed below for reference.    Microbiology: Recent Results (from the past 240 hour(s))  Blood Culture (routine x 2)      Status: None (Preliminary result)   Collection Time: 05/13/17  2:13 PM  Result Value Ref Range Status   Specimen Description BLOOD RIGHT ANTECUBITAL  Final   Special Requests   Final    BOTTLES DRAWN AEROBIC AND ANAEROBIC Blood Culture adequate volume   Culture NO GROWTH 2 DAYS  Final   Report Status PENDING  Incomplete  Urine culture     Status: None   Collection Time: 05/13/17  3:15 PM  Result Value Ref Range Status   Specimen Description URINE, CATHETERIZED  Final   Special Requests NONE  Final   Culture NO GROWTH  Final   Report Status 05/14/2017 FINAL  Final  Blood Culture (routine x 2)     Status: None (Preliminary result)   Collection Time: 05/13/17  3:32 PM  Result Value Ref Range Status   Specimen Description BLOOD RIGHT FOREARM  Final   Special Requests IN PEDIATRIC BOTTLE Blood Culture adequate volume  Final   Culture NO GROWTH 2 DAYS  Final   Report Status PENDING  Incomplete  MRSA PCR Screening     Status: Abnormal   Collection Time: 05/13/17 10:11 PM  Result Value Ref Range Status   MRSA by PCR POSITIVE (A) NEGATIVE Final    Comment:        The GeneXpert MRSA Assay (FDA approved for NASAL specimens only), is one component of a comprehensive MRSA colonization surveillance program. It is not intended to diagnose MRSA infection nor to guide or monitor treatment for MRSA infections. RESULT CALLED TO, READ BACK BY AND VERIFIED WITH: RN Orlene Plum [id number] _0  THANEY     Labs: BNP (last 3 results) No results for input(s): BNP in the last 8760 hours. Basic Metabolic Panel:  Recent Labs Lab 05/13/17 1400 05/13/17 1413 05/14/17 0448 05/15/17 0736  NA  --  138 140 143  K  --  5.1 4.2 3.9  CL  --  97* 110 112*  CO2  --  _1 GLUCOSE  --  310* 114* 155*  BUN  --  73* 59* 32*  CREATININE  --  3.56* 2.21* 1.48*  CALCIUM  --  9.4 8.1* 8.7*  MG 1.9  --   --  1.4*  PHOS  --   --   --  2.0*   Liver Function Tests:  Recent Labs Lab 05/13/17 1413  05/14/17 0448 05/15/17 0736  AST 31 16 <5*  ALT _0 ALKPHOS 67 42 47  BILITOT 0.6 0.7 0.7  PROT 7.2 5.2* 6.1*  ALBUMIN 3.6 2.7* 2.9*   No results for input(s): LIPASE, AMYLASE in the last 168 hours. No results for input(s): AMMONIA in the last 168 hours. CBC:  Recent Labs Lab 05/13/17 1413 05/14/17 0448 05/15/17 0736  WBC 13.6* 6.4 8.0  NEUTROABS 10.9*  --  5.6  HGB 8.5* 8.4* 9.1*  HCT 26.6* 25.6* 27.8*  MCV 95.7 95.9 94.6  PLT 289 142* 157   Cardiac Enzymes: No results for input(s): CKTOTAL, CKMB, CKMBINDEX, TROPONINI in the last 168 hours. BNP: Invalid input(s): POCBNP CBG:  Recent Labs Lab 05/15/17 1655 05/15/17 2021 05/16/17 0021 05/16/17 0637 05/16/17 0758  GLUCAP 145* 148* 183* 155* 153*   D-Dimer No results for input(s): DDIMER in the last 72 hours. Hgb A1c  Recent Labs  05/14/17 0448  HGBA1C 5.4   Lipid Profile No results for input(s): CHOL, HDL, LDLCALC, TRIG, CHOLHDL, LDLDIRECT in the last 72 hours. Thyroid function studies No results for input(s): TSH, T4TOTAL, T3FREE, THYROIDAB in the last 72 hours.  Invalid input(s): FREET3 Anemia work up No results for input(s): VITAMINB12, FOLATE, FERRITIN, TIBC, IRON, RETICCTPCT in the last 72 hours. Urinalysis    Component Value Date/Time   COLORURINE YELLOW 05/13/2017 1515   APPEARANCEUR HAZY (A) 05/13/2017 1515   LABSPEC 1.015 05/13/2017 1515   PHURINE 5.5 05/13/2017 1515   GLUCOSEU NEGATIVE 05/13/2017 1515   HGBUR TRACE (A) 05/13/2017 1515   BILIRUBINUR NEGATIVE 05/13/2017 1515   KETONESUR NEGATIVE 05/13/2017 1515   PROTEINUR NEGATIVE 05/13/2017 1515   UROBILINOGEN 1.0 04/30/2012 1319   NITRITE NEGATIVE 05/13/2017 1515   LEUKOCYTESUR LARGE (A) 05/13/2017 1515   Sepsis Labs Invalid input(s): PROCALCITONIN,  WBC,  LACTICIDVEN Microbiology Recent Results (from the past 240 hour(s))  Blood Culture (routine x 2)     Status: None (Preliminary result)   Collection Time: 05/13/17  2:13  PM  Result Value Ref Range Status   Specimen Description BLOOD RIGHT ANTECUBITAL  Final   Special Requests   Final    BOTTLES DRAWN AEROBIC AND ANAEROBIC Blood Culture adequate volume   Culture NO GROWTH 2 DAYS  Final   Report Status PENDING  Incomplete  Urine culture     Status: None   Collection Time: 05/13/17  3:15 PM  Result Value Ref Range Status   Specimen Description URINE, CATHETERIZED  Final   Special Requests NONE  Final   Culture NO GROWTH  Final   Report Status 05/14/2017 FINAL  Final  Blood Culture (routine x 2)     Status: None (Preliminary result)   Collection Time: 05/13/17  3:32 PM  Result Value Ref Range Status   Specimen Description BLOOD RIGHT FOREARM  Final   Special Requests IN PEDIATRIC BOTTLE Blood Culture adequate volume  Final   Culture NO GROWTH 2 DAYS  Final   Report Status PENDING  Incomplete  MRSA PCR Screening     Status: Abnormal   Collection Time: 05/13/17 10:11 PM  Result Value Ref Range Status   MRSA by PCR POSITIVE (A)  NEGATIVE Final    Comment:        The GeneXpert MRSA Assay (FDA approved for NASAL specimens only), is one component of a comprehensive MRSA colonization surveillance program. It is not intended to diagnose MRSA infection nor to guide or monitor treatment for MRSA infections. RESULT CALLED TO, READ BACK BY AND VERIFIED WITH: RN Orlene Plum [id number] _[id number]  THANEY    Time coordinating discharge: 35 minutes  SIGNED:  Kerney Elbe, DO [facility] 05/16/2017, 11:46 AM Pager [phone]  If 7PM-7AM, please contact night-coverage [email or web address] Password [email or web address]

## 2017-05-16 NOTE — Progress Notes (Signed)
Daily Progress Note   Patient Name: [name]       Date: 05/16/2017 DOB: March 20, 1935  Age: 82 y.o. MRN#: [id number] Attending Physician: Merlene Laughter, DO Primary Care Physician: Geoffry Paradise, MD Admit Date: 05/13/2017  Reason for Consultation/Follow-up: Establishing goals of care  Subjective: Patient resting in bed, family is at bedside. She opens her eyes and looks around intermittently and then rest with her eyes closed. She has required several doses of Ativan overnight for agitation. She appears calm and resting at this time. She has declined to take her morning medications but has had liquid morphine for discomfort.Two of her daughters are at bedside this morning one of them is requesting at her son who works for [employer] [facility] transported her to [facility] on discharge. Call placed to social work to arrange this if possible. Plans for discharge to the facility this afternoon.  Length of Stay: 3  Current Medications: Scheduled Meds:  . ALPRAZolam  0.5 mg Oral BID  . apixaban  2.5 mg Oral BID  . Chlorhexidine Gluconate Cloth  6 each Topical Q0600  . escitalopram  10 mg Oral Daily  . insulin aspart  0-5 Units Subcutaneous QHS  . insulin aspart  0-9 Units Subcutaneous TID WC  . mupirocin ointment  1 application Nasal BID  . QUEtiapine  25 mg Oral BID  . sodium chloride flush  3 mL Intravenous Q12H  . traZODone  25 mg Oral QHS    Continuous Infusions: . dextrose 5 % and 0.45 % NaCl with KCl 20 mEq/L 75 mL/hr at 05/16/17 0212    PRN Meds: acetaminophen **OR** acetaminophen, glycopyrrolate, LORazepam, morphine CONCENTRATE, ondansetron **OR** ondansetron (ZOFRAN) IV  Physical Exam  Constitutional: She appears well-developed and well-nourished.  Fidgeting  and reaching out to hold hands. Agitated.   HENT:  Head: Normocephalic and atraumatic.  Eyes: EOM are normal.  Eyes closed, will split open intermittently.  Cardiovascular:  Warm and dry  Pulmonary/Chest: Effort normal. No respiratory distress.  Intermittent tachypnea.  Musculoskeletal: She exhibits no edema.  Neurological: She is alert.  Dementia at baseline. Confused, unintelligible currently.  Skin: Skin is warm and dry.            Vital Signs: BP 135/89 (BP Location: Right Arm)   Pulse 62   Temp  98 F (36.7 C) (Oral)   Resp (!) 24   Ht 5\' 5"  (1.651 m)   Wt 72.6 kg (160 lb)   SpO2 99%   BMI 26.63 kg/m  SpO2: SpO2: 99 % O2 Device: O2 Device: Not Delivered O2 Flow Rate:    Intake/output summary:   Intake/Output Summary (Last 24 hours) at 05/16/17 1018 Last data filed at 05/16/17 0543  Gross per 24 hour  Intake              900 ml  Output                0 ml  Net              900 ml   LBM: Last BM Date: 05/14/17 Baseline Weight: Weight: 72.6 kg (160 lb) (Obtained from [facility] on 04/17/17) Most recent weight: Weight: 72.6 kg (160 lb)       Palliative Assessment/Data: 20% at best    Flowsheet Rows     Most Recent Value  Intake Tab  Referral Department  Hospitalist  Unit at Time of Referral  Med/Surg Unit  Palliative Care Primary Diagnosis  Sepsis/Infectious Disease  Date Notified  05/13/17  Palliative Care Type  New Palliative care  Reason for referral  Clarify Goals of Care, Advance Care Planning, Counsel Regarding Hospice  Date of Admission  05/13/17  Date first seen by Palliative Care  05/14/17  # of days Palliative referral response time  1 Day(s)  # of days IP prior to Palliative referral  0  Clinical Assessment  Palliative Performance Scale Score  30%  Pain Max last 24 hours  Not able to report  Pain Min Last 24 hours  Not able to report  Dyspnea Max Last 24 Hours  Not able to report  Dyspnea Min Last 24 hours  Not able to report  Nausea Max  Last 24 Hours  Not able to report  Nausea Min Last 24 Hours  Not able to report  Anxiety Max Last 24 Hours  Not able to report  Anxiety Min Last 24 Hours  Not able to report  Other Max Last 24 Hours  Not able to report  Psychosocial & Spiritual Assessment  Palliative Care Outcomes  Patient/Family meeting held?  No  Palliative Care follow-up planned  Yes, Facility      Patient Active Problem List   Diagnosis Date Noted  . Palliative care by specialist   . Sepsis (HCC) 05/13/2017  . Severe dementia 05/13/2017  . Acute kidney injury (HCC) 05/13/2017  . Type 2 diabetes mellitus, uncontrolled (HCC) 05/13/2017  . Coronary artery disease 05/13/2017  . Anemia 05/13/2017  . Takotsubo cardiomyopathy 05/13/2017  . HTN (hypertension) 05/13/2017  . Chest pain 02/01/2012  . Hypertension 02/01/2012  . Type 2 diabetes mellitus (HCC) 02/01/2012  . Pacemaker 02/02/2011  . Long term (current) use of anticoagulants 01/11/2011  . HYPERCHOLESTEROLEMIA 01/28/2009  . ANXIETY 01/28/2009  . MYOCARDIAL INFARCTION 01/28/2009  . Coronary atherosclerosis 01/28/2009  . CARDIOMYOPATHY 01/28/2009  . PAROXYSMAL ATRIAL FIBRILLATION 01/28/2009  . SICK SINUS SYNDROME 01/28/2009  . BRADYCARDIA 01/28/2009  . CHF 01/28/2009  . GERD 01/28/2009  . OSTEOARTHRITIS 01/28/2009  . EDEMA 01/28/2009  . SHORTNESS OF BREATH 01/28/2009    Palliative Care Assessment & Plan   Patient Profile: 82 y.o. female  with past medical history of Coronary artery disease with heart catheterization in 2011 with an EF of 35-40% (echocardiogram 2011 shows EF at 25%), nonischemic cardiomyopathy, atrial  fib, sick sinus syndrome status post pacemaker in 2004, hypertension, dyslipidemia, diabetes, moderate to severe dementia with a progressive functional decline over the past 12 months admitted on 05/13/2017 with agitation, decreased oral intake. Upon admission patient had a lactic acid level of 5.9, acute kidney injury with a creatinine of  3.56. Chest x-ray was negative for acute processes, UA with moderate amount of leukocyte esterase. Patient was started on IV antibiotics.   Assessment: [name] is an 82 y/o female with baseline dementia and barely intelligible speech who is resting in bed with family at bedside. She has had poor appetite and today does not want to take medication, and has an overall failure to thrive.    Recommendations/Plan: Placement to [facility] today.    Goals of Care and Additional Recommendations:  Limitations on Scope of Treatment: Full Comfort Care  Code Status:    Code Status Orders        Start     Ordered   05/13/17 1641  Do not attempt resuscitation (DNR)  Continuous    Question Answer Comment  In the event of cardiac or respiratory ARREST Do not call a "code blue"   In the event of cardiac or respiratory ARREST Do not perform Intubation, CPR, defibrillation or ACLS   In the event of cardiac or respiratory ARREST Use medication by any route, position, wound care, and other measures to relive pain and suffering. May use oxygen, suction and manual treatment of airway obstruction as needed for comfort.      05/13/17 1641    Code Status History    Date Active Date Inactive Code Status Order ID Comments User Context   05/13/2017  3:42 PM 05/13/2017  4:41 PM DNR [id number]  Russella Dar, NP ED    Advance Directive Documentation     Most Recent Value  Type of Advance Directive  Living will  Pre-existing out of facility DNR order (yellow form or pink MOST form)  -  "MOST" Form in Place?  -       Prognosis:   < 2 weeks no po intake, refusing medication medications at this point. Will allow liquid morphine.    Discharge Planning:  Hospice facility today  Care plan was discussed with daughters.  Thank you for allowing the Palliative Medicine Team to assist in the care of this patient.   Time In: 10:00 Time Out: 11:15 1 hour 15 minutes  Prolonged Time Billed  no         Greater than 50%  of this time was spent counseling and coordinating care related to the above assessment and plan.  Morton Stall, NP   Yong Channel, NP Palliative Medicine Team Pager # [phone] (M-F 8a-5p) Team Phone # [phone] (Nights/Weekends)  Please contact Palliative Medicine Team phone at [phone] for questions and concerns.

## 2017-05-16 NOTE — Consult Note (Signed)
[facility] room available this morning. Met with patient's son-in-law/HCPOA to complete paper work for transfer this morning.   Please send discharge summary to [phone].  RN please call report to [phone].  Thank you,  Erling Conte, LCSW [phone]

## 2017-05-16 NOTE — Progress Notes (Addendum)
Patient will DC to: [facility] Anticipated DC date: 05/16/17 Family notified: POA, Wayne/Annette Transport by: [facility] (Alanson Puls family member coordinating transport)   Per MD patient ready for DC to [employer] [facility]. RN, patient, patient's family, and facility notified of DC. Discharge Summary sent to facility. RN given number for report. DC packet on chart. Ambulance transport requested for patient.   CSW signing off.  Cristobal Goldmann, [place] Clinical Social Worker [phone]

## 2017-05-16 NOTE — Progress Notes (Signed)
[name] to be D/C'd Skilled nursing facility per MD order.  Discussed with the patient and all questions fully answered.  VSS, Skin clean, dry and intact without evidence of skin break down, no evidence of skin tears noted. IV catheter discontinued intact. Site without signs and symptoms of complications. Dressing and pressure applied.  An After Visit Summary was printed and given to the patient. Patient received prescription.  Report was called to Cristela Felt, RN at [facility]. All questions answered.  Patient instructed to return to ED, call 911, or call MD for any changes in condition.   Patient escorted via EMT.  [name] 05/16/2017 3:20 PM

## 2017-05-16 NOTE — Progress Notes (Signed)
CSW notified by Terrilee Files, Carley Hammed, that they family would like patient to discharge there today. CSW notified MD.  Cristobal Goldmann LCSWA [phone]

## 2017-05-18 LAB — CULTURE, BLOOD (ROUTINE X 2)
Culture: NO GROWTH
Culture: NO GROWTH
SPECIAL REQUESTS: ADEQUATE
SPECIAL REQUESTS: ADEQUATE

## 2017-06-10 DEATH — deceased

## 2017-10-23 DIAGNOSIS — R638 Other symptoms and signs concerning food and fluid intake: Secondary | ICD-10-CM

## 2018-06-04 IMAGING — CT CT HEAD CODE STROKE
4 series · 16 of 47 positions shown, 18 images · non-contrast
Comparison: CT head 07/02/2015

CLINICAL DATA: Code stroke.  Aphasia

EXAM:
CT HEAD WITHOUT CONTRAST
TECHNIQUE: Contiguous axial images were obtained from the base of the skull
through the vertex without intravenous contrast.

[Series 3: head 5.0 st · axial · 0.46mm/px · z∈[-87,+38]mm · 7 of 35 slices shown, 9 images]
[im 5/35  brain]
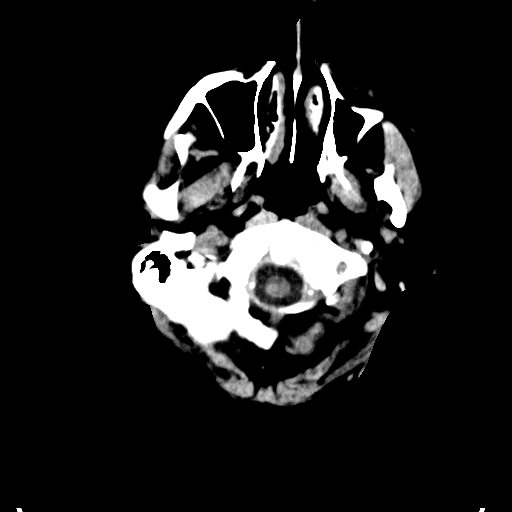
[im 5/35  bone]
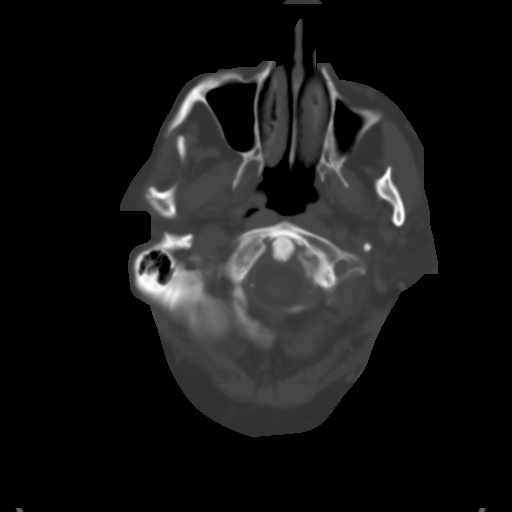
[im 9/35  brain]
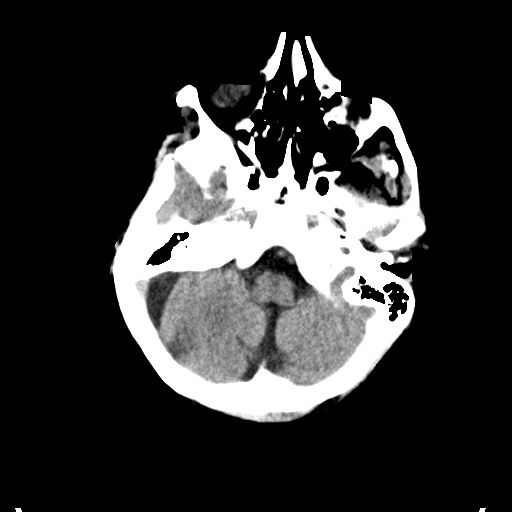
[im 13/35  brain]
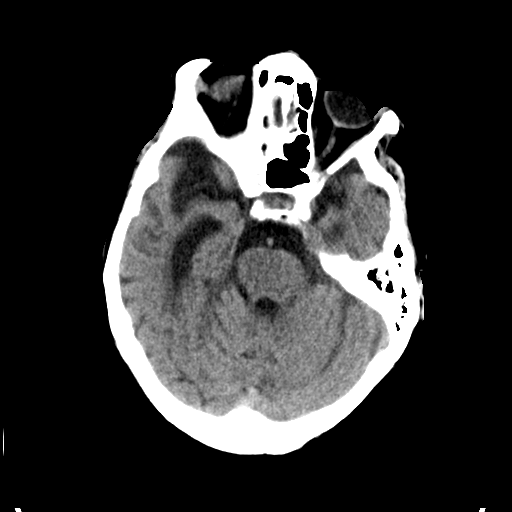
[im 18/35  brain]
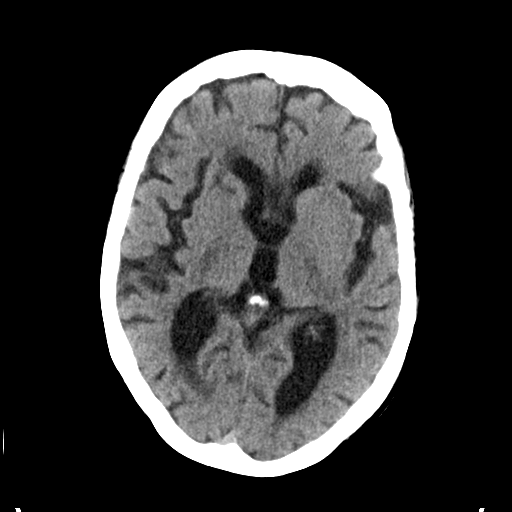
[im 22/35  brain]
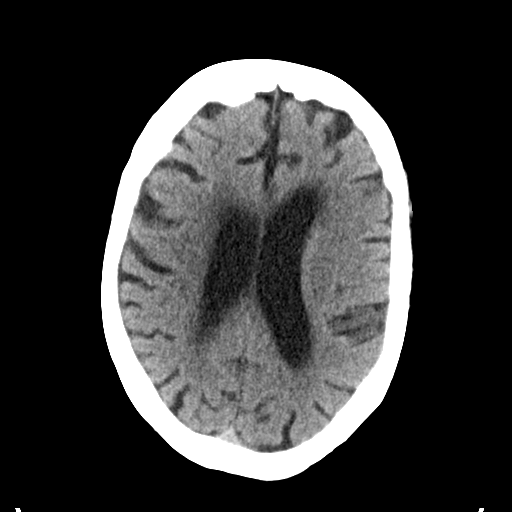
[im 22/35  bone]
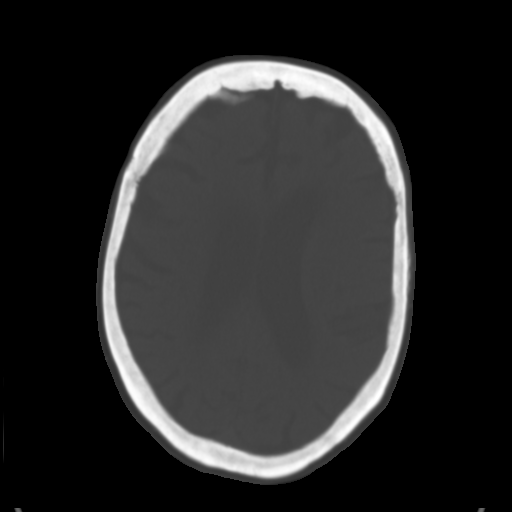
[im 26/35  brain]
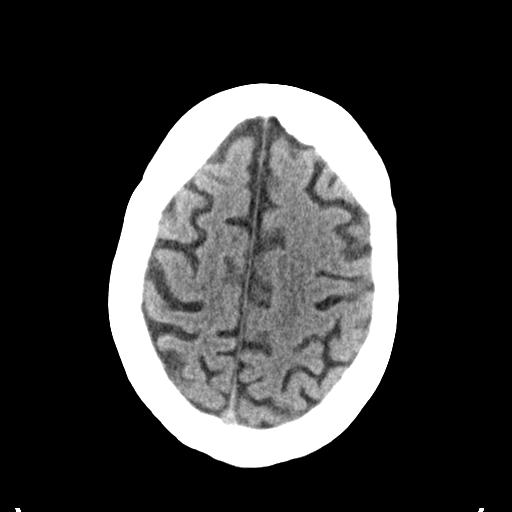
[im 30/35  brain]
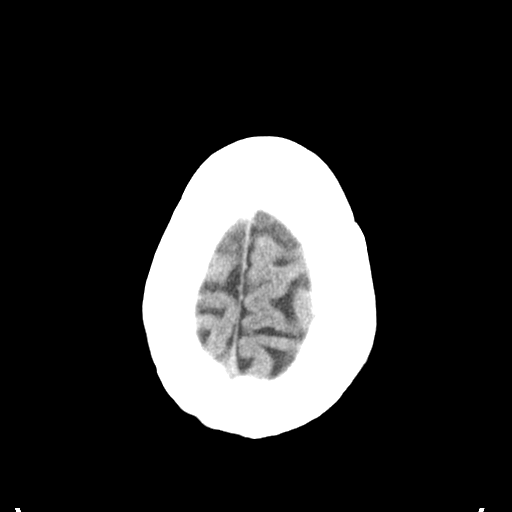

[Series 4: head 2.0 bone · axial · 0.46mm/px · z∈[-91,-55]mm · 3 of 88 slices shown]
[im 9/88  bone]
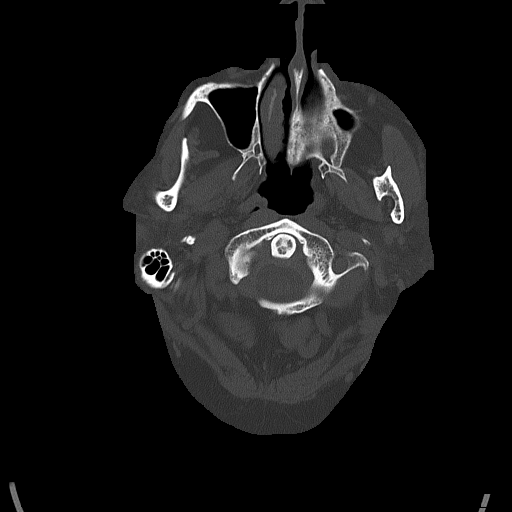
[im 18/88  bone]
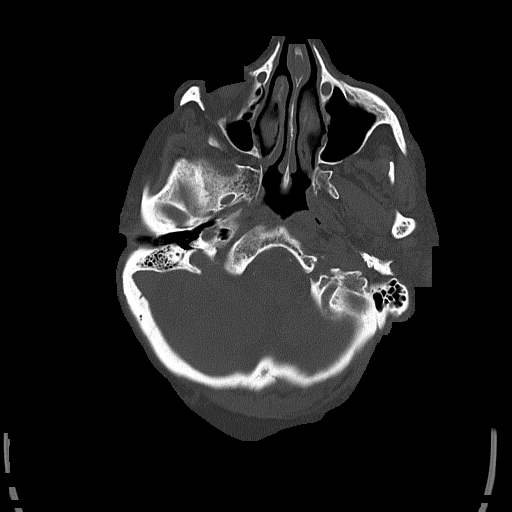
[im 27/88  bone]
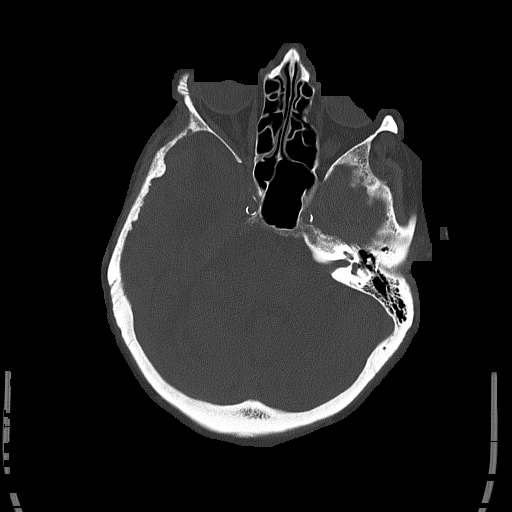

[Series 5: head 3.0 cor st · coronal · 0.37mm/px · 3 of 75 slices shown]
[im 25/75  brain]
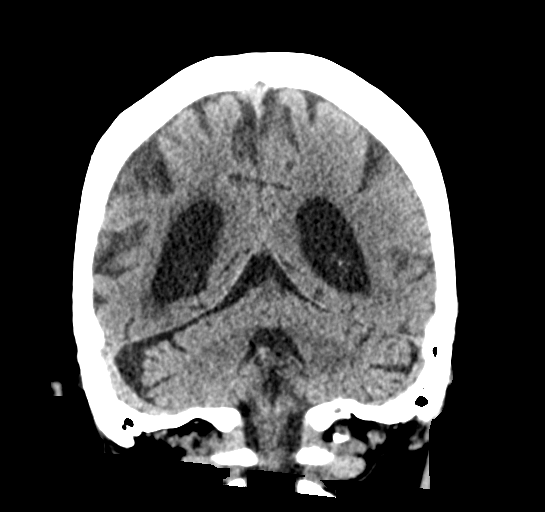
[im 33/75  brain]
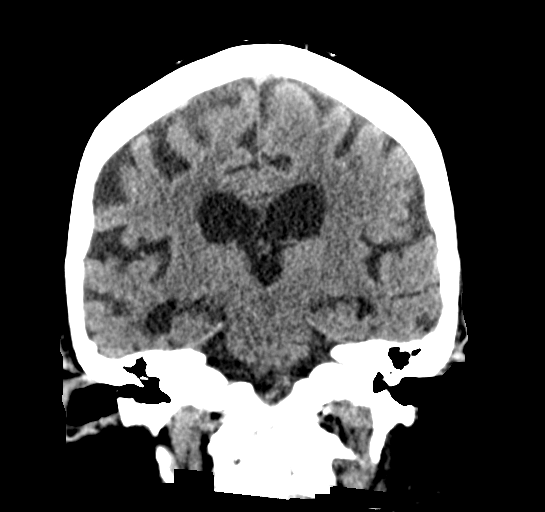
[im 42/75  brain]
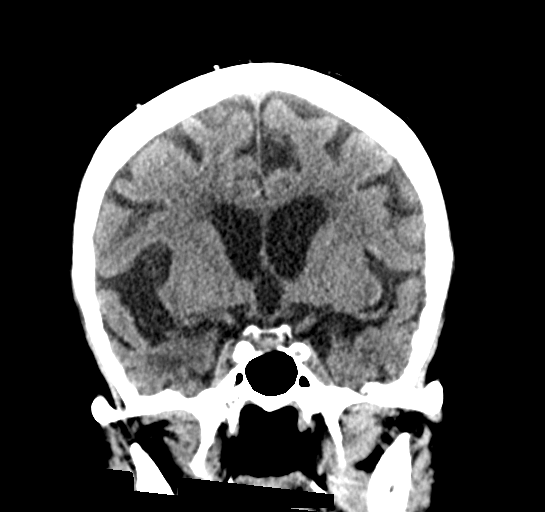

[Series 6: head 3.0 sag st · sagittal · 0.35mm/px · 3 of 67 slices shown]
[im 23/67  brain]
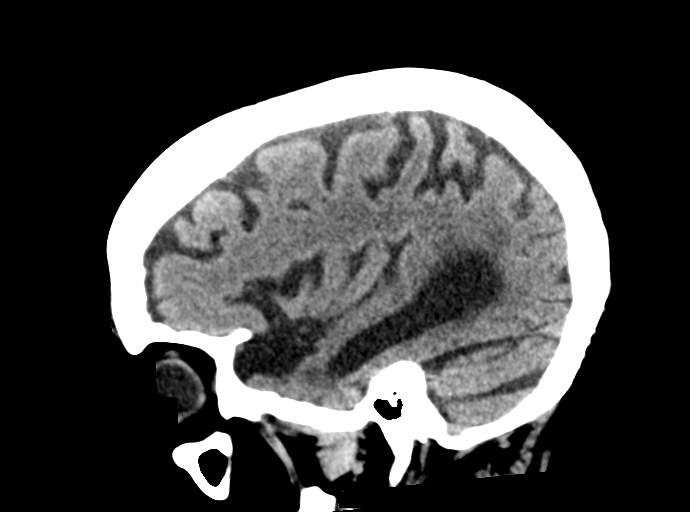
[im 34/67  brain]
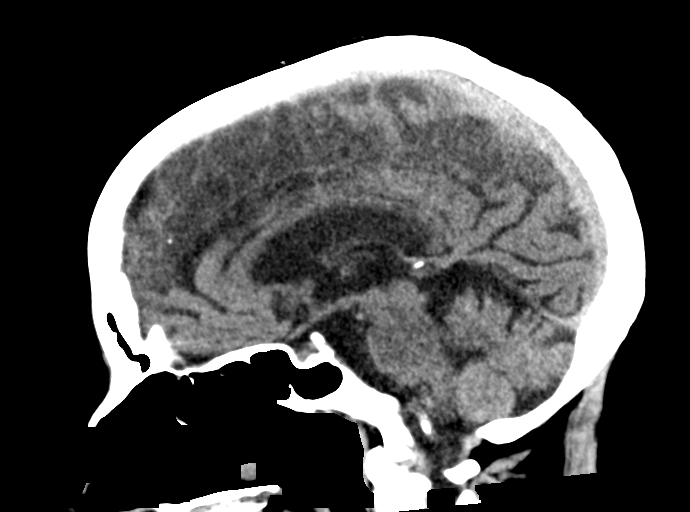
[im 45/67  brain]
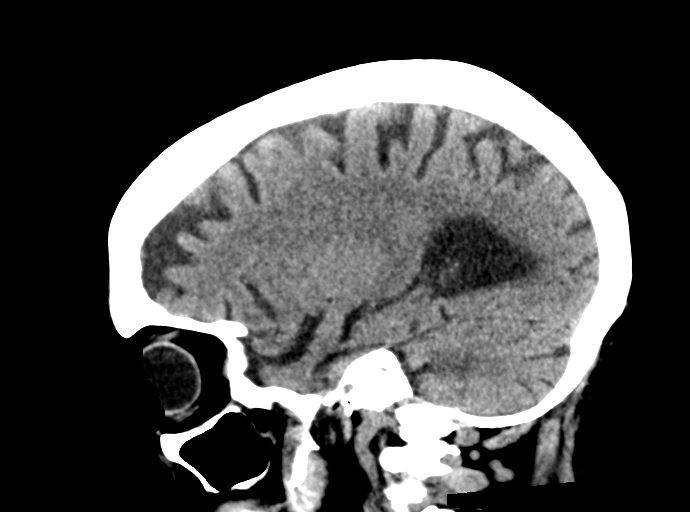

[16 of 47 positions shown; findings below may reference images not displayed]

FINDINGS: Brain: Moderate to advanced atrophy. Ventricular enlargement
consistent with atrophy and stable from the prior study. Chronic
microvascular ischemic changes in the white matter and internal
capsule on the right.

Negative for acute infarct, hemorrhage, mass

Vascular: Arterial calcification.  Negative for hyperdense vessel.

Skull: Negative

Sinuses/Orbits: Negative

Other: None

ASPECTS (Alberta Stroke Program Early CT Score)

- Ganglionic level infarction (caudate, lentiform nuclei, internal
capsule, insula, M1-M3 cortex): 7

- Supraganglionic infarction (M4-M6 cortex): 3

Total score (0-10 with 10 being normal): 10
IMPRESSION: 1. No acute intracranial abnormality
2. ASPECTS is 10

These results were called by telephone at the time of interpretation
on 02/17/2017 at [DATE] to Dr. Tobon, who verbally acknowledged
these results.
